# Patient Record
Sex: Male | Born: 1940 | Race: White | Hispanic: No | Smoking: Former smoker
Health system: Southern US, Community
[De-identification: ages and names within clinical notes are randomized; demographics above are authoritative.]

## PROBLEM LIST (undated history)

## (undated) DIAGNOSIS — J189 Pneumonia, unspecified organism: Secondary | ICD-10-CM

## (undated) DIAGNOSIS — M1990 Unspecified osteoarthritis, unspecified site: Secondary | ICD-10-CM

## (undated) DIAGNOSIS — Z8739 Personal history of other diseases of the musculoskeletal system and connective tissue: Secondary | ICD-10-CM

## (undated) DIAGNOSIS — E785 Hyperlipidemia, unspecified: Secondary | ICD-10-CM

## (undated) DIAGNOSIS — R0600 Dyspnea, unspecified: Secondary | ICD-10-CM

## (undated) DIAGNOSIS — C801 Malignant (primary) neoplasm, unspecified: Secondary | ICD-10-CM

## (undated) DIAGNOSIS — J439 Emphysema, unspecified: Secondary | ICD-10-CM

## (undated) DIAGNOSIS — I10 Essential (primary) hypertension: Secondary | ICD-10-CM

## (undated) HISTORY — PX: CATARACT EXTRACTION W/ INTRAOCULAR LENS IMPLANT: SHX1309

## (undated) HISTORY — DX: Malignant (primary) neoplasm, unspecified: C80.1

## (undated) HISTORY — PX: BACK SURGERY: SHX140

## (undated) HISTORY — PX: LUMBAR DISC SURGERY: SHX700

## (undated) HISTORY — PX: TONSILLECTOMY: SUR1361

## (undated) SURGERY — VIDEO BRONCHOSCOPY WITHOUT FLUORO
Anesthesia: Moderate Sedation

---

## 1997-05-10 ENCOUNTER — Ambulatory Visit (HOSPITAL_COMMUNITY): Admission: RE | Admit: 1997-05-10 | Discharge: 1997-05-10 | Payer: Self-pay | Admitting: Internal Medicine

## 1997-08-25 ENCOUNTER — Ambulatory Visit (HOSPITAL_COMMUNITY): Admission: RE | Admit: 1997-08-25 | Discharge: 1997-08-25 | Payer: Self-pay | Admitting: Internal Medicine

## 1997-08-25 ENCOUNTER — Encounter: Payer: Self-pay | Admitting: Internal Medicine

## 1998-09-24 ENCOUNTER — Emergency Department (HOSPITAL_COMMUNITY): Admission: EM | Admit: 1998-09-24 | Discharge: 1998-09-24 | Payer: Self-pay | Admitting: Emergency Medicine

## 1998-10-25 ENCOUNTER — Observation Stay (HOSPITAL_COMMUNITY): Admission: RE | Admit: 1998-10-25 | Discharge: 1998-10-26 | Payer: Self-pay | Admitting: Neurosurgery

## 1999-01-20 ENCOUNTER — Ambulatory Visit (HOSPITAL_COMMUNITY): Admission: RE | Admit: 1999-01-20 | Discharge: 1999-01-20 | Payer: Self-pay | Admitting: Neurosurgery

## 2005-04-22 ENCOUNTER — Encounter: Admission: RE | Admit: 2005-04-22 | Discharge: 2005-04-22 | Payer: Self-pay | Admitting: Occupational Medicine

## 2005-11-21 ENCOUNTER — Encounter: Admission: RE | Admit: 2005-11-21 | Discharge: 2005-11-21 | Payer: Self-pay | Admitting: Internal Medicine

## 2005-11-24 ENCOUNTER — Encounter: Admission: RE | Admit: 2005-11-24 | Discharge: 2005-11-24 | Payer: Self-pay | Admitting: Internal Medicine

## 2008-07-31 ENCOUNTER — Encounter: Admission: RE | Admit: 2008-07-31 | Discharge: 2008-07-31 | Payer: Self-pay | Admitting: Internal Medicine

## 2008-12-25 ENCOUNTER — Encounter: Admission: RE | Admit: 2008-12-25 | Discharge: 2008-12-25 | Payer: Self-pay | Admitting: Internal Medicine

## 2014-06-02 ENCOUNTER — Other Ambulatory Visit: Payer: Self-pay | Admitting: Internal Medicine

## 2014-06-02 DIAGNOSIS — F17200 Nicotine dependence, unspecified, uncomplicated: Secondary | ICD-10-CM

## 2014-09-04 ENCOUNTER — Ambulatory Visit
Admission: RE | Admit: 2014-09-04 | Discharge: 2014-09-04 | Disposition: A | Discharge status: Home / Self Care | Payer: Medicare HMO | Source: Ambulatory Visit | Attending: Internal Medicine | Admitting: Internal Medicine

## 2014-09-04 ENCOUNTER — Other Ambulatory Visit: Payer: Self-pay | Admitting: Internal Medicine

## 2014-09-04 DIAGNOSIS — J449 Chronic obstructive pulmonary disease, unspecified: Secondary | ICD-10-CM

## 2015-08-09 ENCOUNTER — Other Ambulatory Visit: Payer: Self-pay | Admitting: Internal Medicine

## 2015-08-09 DIAGNOSIS — F17200 Nicotine dependence, unspecified, uncomplicated: Secondary | ICD-10-CM

## 2015-08-23 ENCOUNTER — Encounter (INDEPENDENT_AMBULATORY_CARE_PROVIDER_SITE_OTHER): Payer: Medicare HMO | Admitting: Ophthalmology

## 2015-08-23 DIAGNOSIS — I10 Essential (primary) hypertension: Secondary | ICD-10-CM | POA: Diagnosis not present

## 2015-08-23 DIAGNOSIS — H353221 Exudative age-related macular degeneration, left eye, with active choroidal neovascularization: Secondary | ICD-10-CM | POA: Diagnosis not present

## 2015-08-23 DIAGNOSIS — H353112 Nonexudative age-related macular degeneration, right eye, intermediate dry stage: Secondary | ICD-10-CM | POA: Diagnosis not present

## 2015-08-23 DIAGNOSIS — H43813 Vitreous degeneration, bilateral: Secondary | ICD-10-CM

## 2015-08-23 DIAGNOSIS — D3131 Benign neoplasm of right choroid: Secondary | ICD-10-CM

## 2015-08-23 DIAGNOSIS — D3132 Benign neoplasm of left choroid: Secondary | ICD-10-CM

## 2015-08-23 DIAGNOSIS — H35033 Hypertensive retinopathy, bilateral: Secondary | ICD-10-CM | POA: Diagnosis not present

## 2015-09-20 ENCOUNTER — Encounter (INDEPENDENT_AMBULATORY_CARE_PROVIDER_SITE_OTHER): Payer: Medicare HMO | Admitting: Ophthalmology

## 2015-09-20 DIAGNOSIS — I10 Essential (primary) hypertension: Secondary | ICD-10-CM

## 2015-09-20 DIAGNOSIS — H353112 Nonexudative age-related macular degeneration, right eye, intermediate dry stage: Secondary | ICD-10-CM | POA: Diagnosis not present

## 2015-09-20 DIAGNOSIS — H353221 Exudative age-related macular degeneration, left eye, with active choroidal neovascularization: Secondary | ICD-10-CM | POA: Diagnosis not present

## 2015-09-20 DIAGNOSIS — H35033 Hypertensive retinopathy, bilateral: Secondary | ICD-10-CM | POA: Diagnosis not present

## 2015-09-20 DIAGNOSIS — D3131 Benign neoplasm of right choroid: Secondary | ICD-10-CM

## 2015-09-20 DIAGNOSIS — H43813 Vitreous degeneration, bilateral: Secondary | ICD-10-CM | POA: Diagnosis not present

## 2015-10-18 ENCOUNTER — Encounter (INDEPENDENT_AMBULATORY_CARE_PROVIDER_SITE_OTHER): Payer: Medicare HMO | Admitting: Ophthalmology

## 2015-12-14 ENCOUNTER — Encounter (INDEPENDENT_AMBULATORY_CARE_PROVIDER_SITE_OTHER): Payer: Medicare HMO | Admitting: Ophthalmology

## 2016-07-04 ENCOUNTER — Ambulatory Visit
Admission: RE | Admit: 2016-07-04 | Discharge: 2016-07-04 | Disposition: A | Discharge status: Home / Self Care | Payer: Medicare HMO | Source: Ambulatory Visit | Attending: Internal Medicine | Admitting: Internal Medicine

## 2016-07-04 ENCOUNTER — Other Ambulatory Visit: Payer: Self-pay | Admitting: Internal Medicine

## 2016-07-04 DIAGNOSIS — R634 Abnormal weight loss: Secondary | ICD-10-CM

## 2016-07-04 DIAGNOSIS — R059 Cough, unspecified: Secondary | ICD-10-CM

## 2016-07-04 DIAGNOSIS — R05 Cough: Secondary | ICD-10-CM

## 2016-08-21 ENCOUNTER — Emergency Department (HOSPITAL_COMMUNITY): Payer: Medicare HMO

## 2016-08-21 ENCOUNTER — Emergency Department (HOSPITAL_COMMUNITY): Admit: 2016-08-21 | Payer: Medicare HMO

## 2016-08-21 ENCOUNTER — Ambulatory Visit (INDEPENDENT_AMBULATORY_CARE_PROVIDER_SITE_OTHER): Payer: Medicare HMO | Admitting: Podiatry

## 2016-08-21 ENCOUNTER — Encounter (HOSPITAL_COMMUNITY): Payer: Self-pay | Admitting: Emergency Medicine

## 2016-08-21 ENCOUNTER — Emergency Department (HOSPITAL_COMMUNITY): Payer: Medicare HMO | Admitting: Certified Registered Nurse Anesthetist

## 2016-08-21 ENCOUNTER — Encounter: Payer: Self-pay | Admitting: Podiatry

## 2016-08-21 ENCOUNTER — Other Ambulatory Visit: Payer: Self-pay

## 2016-08-21 ENCOUNTER — Ambulatory Visit (INDEPENDENT_AMBULATORY_CARE_PROVIDER_SITE_OTHER): Payer: Medicare HMO

## 2016-08-21 ENCOUNTER — Inpatient Hospital Stay (HOSPITAL_COMMUNITY)
Admission: EM | Admit: 2016-08-21 | Discharge: 2016-09-16 | DRG: 239 | Disposition: A | Discharge status: Skilled Nursing Facility | Payer: Medicare HMO | Attending: Internal Medicine | Admitting: Internal Medicine

## 2016-08-21 ENCOUNTER — Encounter (HOSPITAL_COMMUNITY)
Admission: EM | Disposition: A | Discharge status: Skilled Nursing Facility | Payer: Self-pay | Source: Home / Self Care | Attending: Vascular Surgery

## 2016-08-21 ENCOUNTER — Ambulatory Visit: Payer: Medicare HMO

## 2016-08-21 VITALS — BP 111/82 | HR 105

## 2016-08-21 DIAGNOSIS — E1151 Type 2 diabetes mellitus with diabetic peripheral angiopathy without gangrene: Principal | ICD-10-CM | POA: Diagnosis present

## 2016-08-21 DIAGNOSIS — R001 Bradycardia, unspecified: Secondary | ICD-10-CM | POA: Diagnosis not present

## 2016-08-21 DIAGNOSIS — I999 Unspecified disorder of circulatory system: Secondary | ICD-10-CM

## 2016-08-21 DIAGNOSIS — E871 Hypo-osmolality and hyponatremia: Secondary | ICD-10-CM | POA: Diagnosis not present

## 2016-08-21 DIAGNOSIS — M7989 Other specified soft tissue disorders: Secondary | ICD-10-CM | POA: Diagnosis present

## 2016-08-21 DIAGNOSIS — J441 Chronic obstructive pulmonary disease with (acute) exacerbation: Secondary | ICD-10-CM | POA: Diagnosis present

## 2016-08-21 DIAGNOSIS — R0603 Acute respiratory distress: Secondary | ICD-10-CM | POA: Diagnosis not present

## 2016-08-21 DIAGNOSIS — E876 Hypokalemia: Secondary | ICD-10-CM | POA: Diagnosis present

## 2016-08-21 DIAGNOSIS — R590 Localized enlarged lymph nodes: Secondary | ICD-10-CM | POA: Diagnosis present

## 2016-08-21 DIAGNOSIS — J910 Malignant pleural effusion: Secondary | ICD-10-CM | POA: Diagnosis present

## 2016-08-21 DIAGNOSIS — E785 Hyperlipidemia, unspecified: Secondary | ICD-10-CM | POA: Diagnosis present

## 2016-08-21 DIAGNOSIS — L8990 Pressure ulcer of unspecified site, unspecified stage: Secondary | ICD-10-CM | POA: Insufficient documentation

## 2016-08-21 DIAGNOSIS — I110 Hypertensive heart disease with heart failure: Secondary | ICD-10-CM | POA: Diagnosis present

## 2016-08-21 DIAGNOSIS — I10 Essential (primary) hypertension: Secondary | ICD-10-CM | POA: Diagnosis not present

## 2016-08-21 DIAGNOSIS — C3401 Malignant neoplasm of right main bronchus: Secondary | ICD-10-CM | POA: Diagnosis present

## 2016-08-21 DIAGNOSIS — T45515A Adverse effect of anticoagulants, initial encounter: Secondary | ICD-10-CM | POA: Diagnosis not present

## 2016-08-21 DIAGNOSIS — D630 Anemia in neoplastic disease: Secondary | ICD-10-CM | POA: Diagnosis present

## 2016-08-21 DIAGNOSIS — I471 Supraventricular tachycardia: Secondary | ICD-10-CM | POA: Diagnosis not present

## 2016-08-21 DIAGNOSIS — Z961 Presence of intraocular lens: Secondary | ICD-10-CM | POA: Diagnosis present

## 2016-08-21 DIAGNOSIS — R918 Other nonspecific abnormal finding of lung field: Secondary | ICD-10-CM | POA: Diagnosis present

## 2016-08-21 DIAGNOSIS — Z7952 Long term (current) use of systemic steroids: Secondary | ICD-10-CM

## 2016-08-21 DIAGNOSIS — R042 Hemoptysis: Secondary | ICD-10-CM | POA: Diagnosis not present

## 2016-08-21 DIAGNOSIS — C3491 Malignant neoplasm of unspecified part of right bronchus or lung: Secondary | ICD-10-CM

## 2016-08-21 DIAGNOSIS — M109 Gout, unspecified: Secondary | ICD-10-CM | POA: Diagnosis present

## 2016-08-21 DIAGNOSIS — D3500 Benign neoplasm of unspecified adrenal gland: Secondary | ICD-10-CM | POA: Diagnosis present

## 2016-08-21 DIAGNOSIS — L89313 Pressure ulcer of right buttock, stage 3: Secondary | ICD-10-CM | POA: Diagnosis present

## 2016-08-21 DIAGNOSIS — Z510 Encounter for antineoplastic radiation therapy: Secondary | ICD-10-CM | POA: Diagnosis present

## 2016-08-21 DIAGNOSIS — M79671 Pain in right foot: Secondary | ICD-10-CM

## 2016-08-21 DIAGNOSIS — M722 Plantar fascial fibromatosis: Secondary | ICD-10-CM | POA: Diagnosis not present

## 2016-08-21 DIAGNOSIS — Z89511 Acquired absence of right leg below knee: Secondary | ICD-10-CM | POA: Diagnosis not present

## 2016-08-21 DIAGNOSIS — J189 Pneumonia, unspecified organism: Secondary | ICD-10-CM | POA: Diagnosis not present

## 2016-08-21 DIAGNOSIS — I5031 Acute diastolic (congestive) heart failure: Secondary | ICD-10-CM | POA: Diagnosis present

## 2016-08-21 DIAGNOSIS — J440 Chronic obstructive pulmonary disease with acute lower respiratory infection: Secondary | ICD-10-CM | POA: Diagnosis not present

## 2016-08-21 DIAGNOSIS — C3431 Malignant neoplasm of lower lobe, right bronchus or lung: Secondary | ICD-10-CM | POA: Diagnosis present

## 2016-08-21 DIAGNOSIS — I4891 Unspecified atrial fibrillation: Secondary | ICD-10-CM | POA: Diagnosis not present

## 2016-08-21 DIAGNOSIS — D696 Thrombocytopenia, unspecified: Secondary | ICD-10-CM | POA: Diagnosis present

## 2016-08-21 DIAGNOSIS — I700 Atherosclerosis of aorta: Secondary | ICD-10-CM | POA: Diagnosis present

## 2016-08-21 DIAGNOSIS — R0600 Dyspnea, unspecified: Secondary | ICD-10-CM

## 2016-08-21 DIAGNOSIS — I998 Other disorder of circulatory system: Secondary | ICD-10-CM | POA: Diagnosis present

## 2016-08-21 DIAGNOSIS — Z881 Allergy status to other antibiotic agents status: Secondary | ICD-10-CM

## 2016-08-21 DIAGNOSIS — J449 Chronic obstructive pulmonary disease, unspecified: Secondary | ICD-10-CM | POA: Diagnosis present

## 2016-08-21 DIAGNOSIS — Z79899 Other long term (current) drug therapy: Secondary | ICD-10-CM

## 2016-08-21 DIAGNOSIS — Z87891 Personal history of nicotine dependence: Secondary | ICD-10-CM

## 2016-08-21 DIAGNOSIS — Z9889 Other specified postprocedural states: Secondary | ICD-10-CM

## 2016-08-21 DIAGNOSIS — I959 Hypotension, unspecified: Secondary | ICD-10-CM | POA: Diagnosis not present

## 2016-08-21 DIAGNOSIS — R0902 Hypoxemia: Secondary | ICD-10-CM | POA: Diagnosis not present

## 2016-08-21 DIAGNOSIS — C7931 Secondary malignant neoplasm of brain: Secondary | ICD-10-CM | POA: Diagnosis present

## 2016-08-21 DIAGNOSIS — I480 Paroxysmal atrial fibrillation: Secondary | ICD-10-CM | POA: Diagnosis not present

## 2016-08-21 DIAGNOSIS — J9589 Other postprocedural complications and disorders of respiratory system, not elsewhere classified: Secondary | ICD-10-CM | POA: Diagnosis not present

## 2016-08-21 DIAGNOSIS — R911 Solitary pulmonary nodule: Secondary | ICD-10-CM | POA: Diagnosis not present

## 2016-08-21 HISTORY — PX: LOWER EXTREMITY ANGIOGRAM: SHX5955

## 2016-08-21 HISTORY — PX: EMBOLECTOMY: SHX44

## 2016-08-21 HISTORY — DX: Essential (primary) hypertension: I10

## 2016-08-21 HISTORY — PX: LOWER EXTREMITY ANGIOGRAM: SHX5508

## 2016-08-21 HISTORY — DX: Unspecified osteoarthritis, unspecified site: M19.90

## 2016-08-21 HISTORY — PX: THROMBECTOMY: PRO61

## 2016-08-21 HISTORY — DX: Emphysema, unspecified: J43.9

## 2016-08-21 HISTORY — DX: Personal history of other diseases of the musculoskeletal system and connective tissue: Z87.39

## 2016-08-21 HISTORY — DX: Hyperlipidemia, unspecified: E78.5

## 2016-08-21 HISTORY — DX: Dyspnea, unspecified: R06.00

## 2016-08-21 HISTORY — DX: Pneumonia, unspecified organism: J18.9

## 2016-08-21 LAB — PROTIME-INR
INR: 1.14
PROTHROMBIN TIME: 14.7 s (ref 11.4–15.2)

## 2016-08-21 LAB — COMPREHENSIVE METABOLIC PANEL
ALT: 31 U/L (ref 17–63)
ANION GAP: 9 (ref 5–15)
AST: 22 U/L (ref 15–41)
Albumin: 2.2 g/dL — ABNORMAL LOW (ref 3.5–5.0)
Alkaline Phosphatase: 85 U/L (ref 38–126)
BUN: 12 mg/dL (ref 6–20)
CHLORIDE: 101 mmol/L (ref 101–111)
CO2: 24 mmol/L (ref 22–32)
CREATININE: 0.78 mg/dL (ref 0.61–1.24)
Calcium: 8.2 mg/dL — ABNORMAL LOW (ref 8.9–10.3)
Glucose, Bld: 113 mg/dL — ABNORMAL HIGH (ref 65–99)
Potassium: 4.3 mmol/L (ref 3.5–5.1)
SODIUM: 134 mmol/L — AB (ref 135–145)
Total Bilirubin: 1.2 mg/dL (ref 0.3–1.2)
Total Protein: 5.5 g/dL — ABNORMAL LOW (ref 6.5–8.1)

## 2016-08-21 LAB — CBC WITH DIFFERENTIAL/PLATELET
BASOS ABS: 0 10*3/uL (ref 0.0–0.1)
BASOS PCT: 0 %
EOS ABS: 0 10*3/uL (ref 0.0–0.7)
Eosinophils Relative: 0 %
HCT: 40 % (ref 39.0–52.0)
Hemoglobin: 13.7 g/dL (ref 13.0–17.0)
LYMPHS ABS: 3.3 10*3/uL (ref 0.7–4.0)
Lymphocytes Relative: 12 %
MCH: 28.8 pg (ref 26.0–34.0)
MCHC: 34.3 g/dL (ref 30.0–36.0)
MCV: 84.2 fL (ref 78.0–100.0)
MONO ABS: 2.2 10*3/uL — AB (ref 0.1–1.0)
Monocytes Relative: 8 %
NEUTROS ABS: 22.1 10*3/uL — AB (ref 1.7–7.7)
Neutrophils Relative %: 80 %
PLATELETS: 176 10*3/uL (ref 150–400)
RBC: 4.75 MIL/uL (ref 4.22–5.81)
RDW: 15.9 % — AB (ref 11.5–15.5)
WBC: 27.6 10*3/uL — ABNORMAL HIGH (ref 4.0–10.5)

## 2016-08-21 LAB — TROPONIN I: TROPONIN I: 0.03 ng/mL — AB (ref <0.03)

## 2016-08-21 LAB — BRAIN NATRIURETIC PEPTIDE: B NATRIURETIC PEPTIDE 5: 53.8 pg/mL (ref 0.0–100.0)

## 2016-08-21 SURGERY — EMBOLECTOMY
Anesthesia: General | Site: Leg Lower | Laterality: Right

## 2016-08-21 MED ORDER — NON FORMULARY
Status: DC | PRN
  Administered 2016-08-21: 5 mg via INTRA_ARTERIAL

## 2016-08-21 MED ORDER — ONDANSETRON HCL 4 MG/2ML IJ SOLN
4.0000 mg | Freq: Four times a day (QID) | INTRAMUSCULAR | Status: DC | PRN
  Administered 2016-08-24: 4 mg via INTRAVENOUS
  Filled 2016-08-21: qty 2

## 2016-08-21 MED ORDER — HYDROCORTISONE NA SUCCINATE PF 100 MG IJ SOLR
100.0000 mg | Freq: Once | INTRAMUSCULAR | Status: AC
  Administered 2016-08-21: 100 mg via INTRAVENOUS
  Filled 2016-08-21: qty 2

## 2016-08-21 MED ORDER — DOXYCYCLINE HYCLATE 100 MG PO TABS
100.0000 mg | ORAL_TABLET | Freq: Two times a day (BID) | ORAL | Status: DC
  Administered 2016-08-21 – 2016-08-23 (×4): 100 mg via ORAL
  Filled 2016-08-21 (×4): qty 1

## 2016-08-21 MED ORDER — ALTEPLASE 2 MG IJ SOLR
10.0000 mg | INTRAMUSCULAR | Status: DC
  Filled 2016-08-21: qty 10

## 2016-08-21 MED ORDER — BISACODYL 5 MG PO TBEC
5.0000 mg | DELAYED_RELEASE_TABLET | Freq: Every day | ORAL | Status: DC | PRN
  Administered 2016-08-28 – 2016-09-03 (×5): 5 mg via ORAL
  Filled 2016-08-21 (×5): qty 1

## 2016-08-21 MED ORDER — METHYLPREDNISOLONE SODIUM SUCC 125 MG IJ SOLR
60.0000 mg | Freq: Three times a day (TID) | INTRAMUSCULAR | Status: DC
  Administered 2016-08-22: 60 mg via INTRAVENOUS
  Filled 2016-08-21: qty 2

## 2016-08-21 MED ORDER — IODIXANOL 320 MG/ML IV SOLN
INTRAVENOUS | Status: DC | PRN
  Administered 2016-08-21: 30 mL via INTRAVENOUS

## 2016-08-21 MED ORDER — PROPOFOL 10 MG/ML IV BOLUS
INTRAVENOUS | Status: DC | PRN
  Administered 2016-08-21: 110 mg via INTRAVENOUS

## 2016-08-21 MED ORDER — AMIODARONE HCL IN DEXTROSE 360-4.14 MG/200ML-% IV SOLN
30.0000 mg/h | INTRAVENOUS | Status: DC
  Administered 2016-08-22 (×2): 30 mg/h via INTRAVENOUS
  Filled 2016-08-21 (×3): qty 200

## 2016-08-21 MED ORDER — NYSTATIN 100000 UNIT/ML MT SUSP: 5.0000 mL | Freq: Four times a day (QID) | OROMUCOSAL | Status: DC

## 2016-08-21 MED ORDER — LIDOCAINE 2% (20 MG/ML) 5 ML SYRINGE
INTRAMUSCULAR | Status: DC | PRN
  Administered 2016-08-21: 60 mg via INTRAVENOUS

## 2016-08-21 MED ORDER — DIPHENHYDRAMINE HCL 50 MG/ML IJ SOLN: 12.5000 mg | Freq: Four times a day (QID) | INTRAMUSCULAR | Status: DC | PRN

## 2016-08-21 MED ORDER — SODIUM CHLORIDE 0.9 % IV SOLN
INTRAVENOUS | Status: DC
  Administered 2016-08-21: 21:00:00 via INTRAVENOUS

## 2016-08-21 MED ORDER — SODIUM CHLORIDE 0.9 % IV SOLN
INTRAVENOUS | Status: DC | PRN
  Administered 2016-08-21: 500 mL

## 2016-08-21 MED ORDER — ACETAMINOPHEN 650 MG RE SUPP: 325.0000 mg | RECTAL | Status: DC | PRN

## 2016-08-21 MED ORDER — ONDANSETRON HCL 4 MG/2ML IJ SOLN
4.0000 mg | Freq: Four times a day (QID) | INTRAMUSCULAR | Status: DC | PRN
Start: 2016-08-21 — End: 2016-08-22

## 2016-08-21 MED ORDER — DM-GUAIFENESIN ER 30-600 MG PO TB12
1.0000 | ORAL_TABLET | Freq: Two times a day (BID) | ORAL | Status: DC
  Administered 2016-08-21 – 2016-09-16 (×49): 1 via ORAL
  Filled 2016-08-21 (×50): qty 1

## 2016-08-21 MED ORDER — DEXAMETHASONE SODIUM PHOSPHATE 10 MG/ML IJ SOLN
INTRAMUSCULAR | Status: DC | PRN
  Administered 2016-08-21: 5 mg via INTRAVENOUS

## 2016-08-21 MED ORDER — LEVALBUTEROL HCL 1.25 MG/0.5ML IN NEBU
1.2500 mg | INHALATION_SOLUTION | Freq: Three times a day (TID) | RESPIRATORY_TRACT | Status: DC
  Administered 2016-08-22 (×3): 1.25 mg via RESPIRATORY_TRACT
  Filled 2016-08-21 (×7): qty 0.5

## 2016-08-21 MED ORDER — ACETAMINOPHEN 325 MG PO TABS
325.0000 mg | ORAL_TABLET | ORAL | Status: DC | PRN
  Administered 2016-08-23 (×2): 650 mg via ORAL
  Filled 2016-08-21 (×2): qty 2

## 2016-08-21 MED ORDER — LACTATED RINGERS IV SOLN
INTRAVENOUS | Status: DC
  Administered 2016-08-21 – 2016-08-28 (×3): via INTRAVENOUS

## 2016-08-21 MED ORDER — ESMOLOL HCL 100 MG/10ML IV SOLN
INTRAVENOUS | Status: DC | PRN
  Administered 2016-08-21: 20 mg via INTRAVENOUS
  Administered 2016-08-21: 30 mg via INTRAVENOUS

## 2016-08-21 MED ORDER — PANTOPRAZOLE SODIUM 40 MG PO TBEC
40.0000 mg | DELAYED_RELEASE_TABLET | Freq: Every day | ORAL | Status: DC
  Administered 2016-08-22 – 2016-09-16 (×24): 40 mg via ORAL
  Filled 2016-08-21 (×25): qty 1

## 2016-08-21 MED ORDER — METOPROLOL TARTRATE 5 MG/5ML IV SOLN
INTRAVENOUS | Status: AC
  Filled 2016-08-21: qty 5

## 2016-08-21 MED ORDER — MORPHINE SULFATE 2 MG/ML IV SOLN
INTRAVENOUS | Status: DC
  Administered 2016-08-21: 0 mg via INTRAVENOUS
  Administered 2016-08-21: 21:00:00 via INTRAVENOUS
  Administered 2016-08-22: 0 mg via INTRAVENOUS
  Administered 2016-08-22: 1 mg via INTRAVENOUS
  Administered 2016-08-22: 0 mg via INTRAVENOUS
  Administered 2016-08-22: 1 mg via INTRAVENOUS
  Filled 2016-08-21: qty 25
  Filled 2016-08-21: qty 30

## 2016-08-21 MED ORDER — NALOXONE HCL 0.4 MG/ML IJ SOLN: 0.4000 mg | INTRAMUSCULAR | Status: DC | PRN

## 2016-08-21 MED ORDER — IPRATROPIUM BROMIDE 0.02 % IN SOLN
0.5000 mg | Freq: Three times a day (TID) | RESPIRATORY_TRACT | Status: DC
  Administered 2016-08-22 (×3): 0.5 mg via RESPIRATORY_TRACT
  Filled 2016-08-21 (×6): qty 2.5

## 2016-08-21 MED ORDER — SODIUM CHLORIDE 0.9 % IV SOLN: 500.0000 mL | Freq: Once | INTRAVENOUS | Status: DC | PRN

## 2016-08-21 MED ORDER — PHENYLEPHRINE 40 MCG/ML (10ML) SYRINGE FOR IV PUSH (FOR BLOOD PRESSURE SUPPORT)
PREFILLED_SYRINGE | INTRAVENOUS | Status: AC
  Filled 2016-08-21: qty 10

## 2016-08-21 MED ORDER — PHENYLEPHRINE 40 MCG/ML (10ML) SYRINGE FOR IV PUSH (FOR BLOOD PRESSURE SUPPORT)
PREFILLED_SYRINGE | INTRAVENOUS | Status: DC | PRN
  Administered 2016-08-21 (×2): 80 ug via INTRAVENOUS

## 2016-08-21 MED ORDER — EPHEDRINE 5 MG/ML INJ
INTRAVENOUS | Status: AC
  Filled 2016-08-21: qty 10

## 2016-08-21 MED ORDER — OXYCODONE-ACETAMINOPHEN 5-325 MG PO TABS
1.0000 | ORAL_TABLET | ORAL | Status: DC | PRN
Start: 2016-08-21 — End: 2016-09-16
  Administered 2016-08-23 – 2016-09-15 (×24): 2 via ORAL
  Filled 2016-08-21 (×27): qty 2

## 2016-08-21 MED ORDER — IPRATROPIUM BROMIDE 0.02 % IN SOLN
0.5000 mg | RESPIRATORY_TRACT | Status: DC
  Filled 2016-08-21: qty 2.5

## 2016-08-21 MED ORDER — ROCURONIUM BROMIDE 10 MG/ML (PF) SYRINGE
PREFILLED_SYRINGE | INTRAVENOUS | Status: AC
  Filled 2016-08-21: qty 5

## 2016-08-21 MED ORDER — SUGAMMADEX SODIUM 200 MG/2ML IV SOLN
INTRAVENOUS | Status: AC
  Filled 2016-08-21: qty 2

## 2016-08-21 MED ORDER — 0.9 % SODIUM CHLORIDE (POUR BTL) OPTIME
TOPICAL | Status: DC | PRN
  Administered 2016-08-21: 3000 mL

## 2016-08-21 MED ORDER — DEXAMETHASONE SODIUM PHOSPHATE 10 MG/ML IJ SOLN
INTRAMUSCULAR | Status: AC
  Filled 2016-08-21: qty 1

## 2016-08-21 MED ORDER — HYDROCODONE-ACETAMINOPHEN 7.5-325 MG PO TABS
1.0000 | ORAL_TABLET | Freq: Once | ORAL | Status: AC
  Administered 2016-08-21: 1 via ORAL
  Filled 2016-08-21 (×2): qty 1

## 2016-08-21 MED ORDER — DIPHENHYDRAMINE HCL 12.5 MG/5ML PO ELIX: 12.5000 mg | ORAL_SOLUTION | Freq: Four times a day (QID) | ORAL | Status: DC | PRN

## 2016-08-21 MED ORDER — SODIUM CHLORIDE 0.9% FLUSH: 9.0000 mL | INTRAVENOUS | Status: DC | PRN

## 2016-08-21 MED ORDER — SODIUM CHLORIDE 0.9 % IV BOLUS (SEPSIS)
1000.0000 mL | Freq: Once | INTRAVENOUS | Status: AC
  Administered 2016-08-21: 1000 mL via INTRAVENOUS

## 2016-08-21 MED ORDER — LEVALBUTEROL HCL 0.63 MG/3ML IN NEBU: 0.6300 mg | INHALATION_SOLUTION | Freq: Four times a day (QID) | RESPIRATORY_TRACT | Status: DC | PRN

## 2016-08-21 MED ORDER — ESMOLOL HCL 100 MG/10ML IV SOLN
INTRAVENOUS | Status: AC
  Filled 2016-08-21: qty 10

## 2016-08-21 MED ORDER — FENTANYL CITRATE (PF) 250 MCG/5ML IJ SOLN
INTRAMUSCULAR | Status: AC
  Filled 2016-08-21: qty 5

## 2016-08-21 MED ORDER — PROPOFOL 10 MG/ML IV BOLUS
INTRAVENOUS | Status: AC
  Filled 2016-08-21: qty 20

## 2016-08-21 MED ORDER — SENNOSIDES-DOCUSATE SODIUM 8.6-50 MG PO TABS: 1.0000 | ORAL_TABLET | Freq: Every evening | ORAL | Status: DC | PRN

## 2016-08-21 MED ORDER — ALUM & MAG HYDROXIDE-SIMETH 200-200-20 MG/5ML PO SUSP: 15.0000 mL | ORAL | Status: DC | PRN

## 2016-08-21 MED ORDER — LABETALOL HCL 5 MG/ML IV SOLN: 10.0000 mg | INTRAVENOUS | Status: DC | PRN

## 2016-08-21 MED ORDER — EPHEDRINE SULFATE-NACL 50-0.9 MG/10ML-% IV SOSY
PREFILLED_SYRINGE | INTRAVENOUS | Status: DC | PRN
  Administered 2016-08-21: 10 mg via INTRAVENOUS

## 2016-08-21 MED ORDER — METOPROLOL TARTRATE 5 MG/5ML IV SOLN
2.0000 mg | INTRAVENOUS | Status: AC | PRN
  Administered 2016-08-29 – 2016-08-30 (×2): 5 mg via INTRAVENOUS
  Filled 2016-08-21 (×2): qty 5

## 2016-08-21 MED ORDER — PHENYLEPHRINE HCL 10 MG/ML IJ SOLN
INTRAVENOUS | Status: DC | PRN
  Administered 2016-08-21: 25 ug/min via INTRAVENOUS

## 2016-08-21 MED ORDER — DOCUSATE SODIUM 100 MG PO CAPS
100.0000 mg | ORAL_CAPSULE | Freq: Every day | ORAL | Status: DC
  Administered 2016-08-22 – 2016-09-15 (×19): 100 mg via ORAL
  Filled 2016-08-21 (×24): qty 1

## 2016-08-21 MED ORDER — LISINOPRIL 10 MG PO TABS: 10.0000 mg | ORAL_TABLET | Freq: Every day | ORAL | Status: DC

## 2016-08-21 MED ORDER — DEXTROSE 5 % IV SOLN
1.5000 g | Freq: Two times a day (BID) | INTRAVENOUS | Status: AC
  Administered 2016-08-21 – 2016-08-22 (×2): 1.5 g via INTRAVENOUS
  Filled 2016-08-21 (×2): qty 1.5

## 2016-08-21 MED ORDER — SODIUM CHLORIDE 0.9 % IJ SOLN
INTRAMUSCULAR | Status: AC
  Filled 2016-08-21: qty 10

## 2016-08-21 MED ORDER — LEVALBUTEROL HCL 1.25 MG/0.5ML IN NEBU
1.2500 mg | INHALATION_SOLUTION | Freq: Four times a day (QID) | RESPIRATORY_TRACT | Status: DC
  Filled 2016-08-21: qty 0.5

## 2016-08-21 MED ORDER — GUAIFENESIN-DM 100-10 MG/5ML PO SYRP
15.0000 mL | ORAL_SOLUTION | ORAL | Status: DC | PRN
  Administered 2016-08-29 – 2016-09-16 (×3): 15 mL via ORAL
  Filled 2016-08-21 (×3): qty 15
  Filled 2016-08-21: qty 20

## 2016-08-21 MED ORDER — DILTIAZEM HCL 100 MG IV SOLR
5.0000 mg/h | INTRAVENOUS | Status: DC
  Filled 2016-08-21: qty 100

## 2016-08-21 MED ORDER — HEPARIN (PORCINE) IN NACL 100-0.45 UNIT/ML-% IJ SOLN
1000.0000 [IU]/h | INTRAMUSCULAR | Status: DC
Start: 2016-08-21 — End: 2016-08-21
  Filled 2016-08-21 (×2): qty 250

## 2016-08-21 MED ORDER — AMIODARONE HCL IN DEXTROSE 360-4.14 MG/200ML-% IV SOLN
30.0000 mg/h | INTRAVENOUS | Status: AC
  Administered 2016-08-21 – 2016-08-22 (×2): 60 mg/h via INTRAVENOUS
  Filled 2016-08-21 (×2): qty 200

## 2016-08-21 MED ORDER — CEFAZOLIN SODIUM-DEXTROSE 2-3 GM-% IV SOLR
INTRAVENOUS | Status: DC | PRN
  Administered 2016-08-21: 2 g via INTRAVENOUS

## 2016-08-21 MED ORDER — SUGAMMADEX SODIUM 200 MG/2ML IV SOLN
INTRAVENOUS | Status: DC | PRN
  Administered 2016-08-21: 200 mg via INTRAVENOUS

## 2016-08-21 MED ORDER — METOPROLOL TARTRATE 5 MG/5ML IV SOLN
5.0000 mg | Freq: Once | INTRAVENOUS | Status: AC
  Administered 2016-08-21: 2.5 mg via INTRAVENOUS

## 2016-08-21 MED ORDER — AMIODARONE IV BOLUS ONLY 150 MG/100ML
150.0000 mg | Freq: Once | INTRAVENOUS | Status: DC
  Filled 2016-08-21: qty 100

## 2016-08-21 MED ORDER — SUCCINYLCHOLINE CHLORIDE 200 MG/10ML IV SOSY
PREFILLED_SYRINGE | INTRAVENOUS | Status: AC
  Filled 2016-08-21: qty 10

## 2016-08-21 MED ORDER — HYDROMORPHONE HCL 1 MG/ML IJ SOLN: 0.2500 mg | INTRAMUSCULAR | Status: DC | PRN

## 2016-08-21 MED ORDER — LACTATED RINGERS IV SOLN
INTRAVENOUS | Status: DC | PRN
  Administered 2016-08-21 (×2): via INTRAVENOUS

## 2016-08-21 MED ORDER — ONDANSETRON HCL 4 MG/2ML IJ SOLN
INTRAMUSCULAR | Status: DC | PRN
  Administered 2016-08-21: 4 mg via INTRAVENOUS

## 2016-08-21 MED ORDER — ALBUMIN HUMAN 5 % IV SOLN
INTRAVENOUS | Status: DC | PRN
  Administered 2016-08-21: 19:00:00 via INTRAVENOUS

## 2016-08-21 MED ORDER — HYDRALAZINE HCL 20 MG/ML IJ SOLN
5.0000 mg | INTRAMUSCULAR | Status: DC | PRN
  Filled 2016-08-21: qty 0.25

## 2016-08-21 MED ORDER — HEPARIN BOLUS VIA INFUSION
3000.0000 [IU] | Freq: Once | INTRAVENOUS | Status: DC
  Filled 2016-08-21: qty 3000

## 2016-08-21 MED ORDER — PREDNISONE 10 MG PO TABS: 10.0000 mg | ORAL_TABLET | Freq: Every day | ORAL | Status: DC

## 2016-08-21 MED ORDER — HEPARIN (PORCINE) IN NACL 100-0.45 UNIT/ML-% IJ SOLN
1200.0000 [IU]/h | INTRAMUSCULAR | Status: DC
  Administered 2016-08-21: 750 [IU]/h via INTRAVENOUS
  Administered 2016-08-22: 1200 [IU]/h via INTRAVENOUS
  Filled 2016-08-21 (×2): qty 250

## 2016-08-21 MED ORDER — CEFAZOLIN SODIUM 1 G IJ SOLR
INTRAMUSCULAR | Status: AC
  Filled 2016-08-21: qty 20

## 2016-08-21 MED ORDER — PHENOL 1.4 % MT LIQD
1.0000 | OROMUCOSAL | Status: DC | PRN
  Filled 2016-08-21 (×2): qty 177

## 2016-08-21 MED ORDER — LIDOCAINE 2% (20 MG/ML) 5 ML SYRINGE
INTRAMUSCULAR | Status: AC
  Filled 2016-08-21: qty 5

## 2016-08-21 MED ORDER — AMIODARONE LOAD VIA INFUSION
150.0000 mg | Freq: Once | INTRAVENOUS | Status: AC
  Administered 2016-08-21: 150 mg via INTRAVENOUS
  Filled 2016-08-21: qty 83.34

## 2016-08-21 MED ORDER — ATORVASTATIN CALCIUM 40 MG PO TABS
40.0000 mg | ORAL_TABLET | Freq: Every day | ORAL | Status: DC
  Administered 2016-08-22 – 2016-09-15 (×23): 40 mg via ORAL
  Filled 2016-08-21 (×24): qty 1

## 2016-08-21 MED ORDER — SUCCINYLCHOLINE CHLORIDE 200 MG/10ML IV SOSY
PREFILLED_SYRINGE | INTRAVENOUS | Status: DC | PRN
  Administered 2016-08-21: 100 mg via INTRAVENOUS

## 2016-08-21 MED ORDER — ROCURONIUM BROMIDE 10 MG/ML (PF) SYRINGE
PREFILLED_SYRINGE | INTRAVENOUS | Status: DC | PRN
  Administered 2016-08-21: 50 mg via INTRAVENOUS

## 2016-08-21 MED ORDER — FENTANYL CITRATE (PF) 100 MCG/2ML IJ SOLN
INTRAMUSCULAR | Status: DC | PRN
  Administered 2016-08-21 (×3): 50 ug via INTRAVENOUS

## 2016-08-21 MED ORDER — HEPARIN SODIUM (PORCINE) 1000 UNIT/ML IJ SOLN
INTRAMUSCULAR | Status: DC | PRN
  Administered 2016-08-21: 3000 [IU] via INTRAVENOUS
  Administered 2016-08-21: 7000 [IU] via INTRAVENOUS

## 2016-08-21 MED ORDER — MAGNESIUM SULFATE 2 GM/50ML IV SOLN
2.0000 g | Freq: Every day | INTRAVENOUS | Status: DC | PRN
  Filled 2016-08-21: qty 50

## 2016-08-21 MED ORDER — POTASSIUM CHLORIDE CRYS ER 20 MEQ PO TBCR: 20.0000 meq | EXTENDED_RELEASE_TABLET | Freq: Every day | ORAL | Status: DC | PRN

## 2016-08-21 MED ORDER — SODIUM CHLORIDE 0.9 % IJ SOLN: INTRAVENOUS | Status: DC | PRN

## 2016-08-21 SURGICAL SUPPLY — 90 items
BAG SNAP BAND KOVER 36X36 (MISCELLANEOUS) ×4 IMPLANT
BLADE SURG 11 STRL SS (BLADE) IMPLANT
CANISTER SUCT 3000ML PPV (MISCELLANEOUS) ×4 IMPLANT
CANNULA VESSEL 3MM 2 BLNT TIP (CANNULA) IMPLANT
CATH ANGIO 5F BER2 65CM (CATHETERS) IMPLANT
CATH EMB 2FR 60CM (CATHETERS) ×4 IMPLANT
CATH OMNI FLUSH .035X70CM (CATHETERS) IMPLANT
CHLORAPREP W/TINT 26ML (MISCELLANEOUS) IMPLANT
CLIP VESOCCLUDE MED 24/CT (CLIP) ×4 IMPLANT
CLIP VESOCCLUDE SM WIDE 24/CT (CLIP) ×4 IMPLANT
COVER DOME SNAP 22 D (MISCELLANEOUS) ×4 IMPLANT
COVER PROBE W GEL 5X96 (DRAPES) ×4 IMPLANT
COVER SURGICAL LIGHT HANDLE (MISCELLANEOUS) IMPLANT
CUFF TOURNIQUET SINGLE 24IN (TOURNIQUET CUFF) IMPLANT
CUFF TOURNIQUET SINGLE 34IN LL (TOURNIQUET CUFF) IMPLANT
CUFF TOURNIQUET SINGLE 44IN (TOURNIQUET CUFF) IMPLANT
DERMABOND ADHESIVE PROPEN (GAUZE/BANDAGES/DRESSINGS) ×2
DERMABOND ADVANCED (GAUZE/BANDAGES/DRESSINGS) ×2
DERMABOND ADVANCED .7 DNX12 (GAUZE/BANDAGES/DRESSINGS) ×2 IMPLANT
DERMABOND ADVANCED .7 DNX6 (GAUZE/BANDAGES/DRESSINGS) ×2 IMPLANT
DEVICE TORQUE KENDALL .025-038 (MISCELLANEOUS) IMPLANT
DRAIN CHANNEL 15F RND FF W/TCR (WOUND CARE) IMPLANT
DRAPE C-ARM 42X72 X-RAY (DRAPES) IMPLANT
DRAPE FEMORAL ANGIO 80X135IN (DRAPES) IMPLANT
DRAPE HALF SHEET 40X57 (DRAPES) IMPLANT
DRSG COVADERM 4X8 (GAUZE/BANDAGES/DRESSINGS) ×4 IMPLANT
DRSG TEGADERM 2-3/8X2-3/4 SM (GAUZE/BANDAGES/DRESSINGS) ×4 IMPLANT
ELECT REM PT RETURN 9FT ADLT (ELECTROSURGICAL) ×4
ELECTRODE REM PT RTRN 9FT ADLT (ELECTROSURGICAL) ×2 IMPLANT
EVACUATOR SILICONE 100CC (DRAIN) IMPLANT
GAUZE SPONGE 2X2 8PLY STRL LF (GAUZE/BANDAGES/DRESSINGS) ×2 IMPLANT
GAUZE SPONGE 4X4 12PLY STRL LF (GAUZE/BANDAGES/DRESSINGS) ×8 IMPLANT
GAUZE SPONGE 4X4 16PLY XRAY LF (GAUZE/BANDAGES/DRESSINGS) ×4 IMPLANT
GLOVE BIO SURGEON STRL SZ7.5 (GLOVE) ×4 IMPLANT
GLOVE BIOGEL PI IND STRL 7.5 (GLOVE) ×2 IMPLANT
GLOVE BIOGEL PI INDICATOR 7.5 (GLOVE) ×2
GLOVE SURG SS PI 7.5 STRL IVOR (GLOVE) ×4 IMPLANT
GOWN STRL REUS W/ TWL LRG LVL3 (GOWN DISPOSABLE) ×8 IMPLANT
GOWN STRL REUS W/ TWL XL LVL3 (GOWN DISPOSABLE) ×4 IMPLANT
GOWN STRL REUS W/TWL LRG LVL3 (GOWN DISPOSABLE) ×8
GOWN STRL REUS W/TWL XL LVL3 (GOWN DISPOSABLE) ×4
GUIDEWIRE ANGLED .035X150CM (WIRE) IMPLANT
HEMOSTAT SPONGE AVITENE ULTRA (HEMOSTASIS) IMPLANT
INSERT FOGARTY SM (MISCELLANEOUS) IMPLANT
KIT BASIN OR (CUSTOM PROCEDURE TRAY) ×4 IMPLANT
KIT ROOM TURNOVER OR (KITS) ×4 IMPLANT
MARKER GRAFT CORONARY BYPASS (MISCELLANEOUS) IMPLANT
NEEDLE PERC 18GX7CM (NEEDLE) ×4 IMPLANT
NS IRRIG 1000ML POUR BTL (IV SOLUTION) ×12 IMPLANT
PACK PERIPHERAL VASCULAR (CUSTOM PROCEDURE TRAY) ×4 IMPLANT
PACK SURGICAL SETUP 50X90 (CUSTOM PROCEDURE TRAY) ×4 IMPLANT
PAD ARMBOARD 7.5X6 YLW CONV (MISCELLANEOUS) ×8 IMPLANT
PROTECTION STATION PRESSURIZED (MISCELLANEOUS) ×4
SET COLLECT BLD 21X3/4 12 (NEEDLE) ×4 IMPLANT
SET MICROPUNCTURE 5F STIFF (MISCELLANEOUS) ×4 IMPLANT
SHEATH AVANTI 11CM 5FR (MISCELLANEOUS) ×4 IMPLANT
SHEATH PINNACLE 8F 10CM (SHEATH) ×4 IMPLANT
SPONGE GAUZE 2X2 STER 10/PKG (GAUZE/BANDAGES/DRESSINGS) ×2
STAPLER VISISTAT 35W (STAPLE) ×4 IMPLANT
STATION PROTECTION PRESSURIZED (MISCELLANEOUS) ×2 IMPLANT
STOPCOCK 4 WAY LG BORE MALE ST (IV SETS) ×4 IMPLANT
STOPCOCK MORSE 400PSI 3WAY (MISCELLANEOUS) ×4 IMPLANT
SUT ETHILON 3 0 PS 1 (SUTURE) IMPLANT
SUT GORETEX 6.0 TT13 (SUTURE) IMPLANT
SUT GORETEX 6.0 TT9 (SUTURE) IMPLANT
SUT MNCRL AB 4-0 PS2 18 (SUTURE) IMPLANT
SUT PROLENE 5 0 C 1 24 (SUTURE) IMPLANT
SUT PROLENE 6 0 BV (SUTURE) ×12 IMPLANT
SUT PROLENE 7 0 BV 1 (SUTURE) ×8 IMPLANT
SUT SILK 2 0 SH (SUTURE) IMPLANT
SUT SILK 3 0 (SUTURE)
SUT SILK 3-0 18XBRD TIE 12 (SUTURE) IMPLANT
SUT VIC AB 2-0 CT1 27 (SUTURE) ×2
SUT VIC AB 2-0 CT1 TAPERPNT 27 (SUTURE) ×2 IMPLANT
SUT VIC AB 3-0 SH 27 (SUTURE) ×6
SUT VIC AB 3-0 SH 27X BRD (SUTURE) ×6 IMPLANT
SYR 10ML LL (SYRINGE) ×12 IMPLANT
SYR 20CC LL (SYRINGE) ×4 IMPLANT
SYR 30ML LL (SYRINGE) ×4 IMPLANT
SYR 3ML LL SCALE MARK (SYRINGE) ×4 IMPLANT
SYR MEDRAD MARK V 150ML (SYRINGE) IMPLANT
TAPE CLOTH SURG 4X10 WHT LF (GAUZE/BANDAGES/DRESSINGS) ×8 IMPLANT
TOWEL GREEN STERILE (TOWEL DISPOSABLE) ×4 IMPLANT
TRAY FOLEY W/METER SILVER 16FR (SET/KITS/TRAYS/PACK) ×4 IMPLANT
TUBING ART PRESS 72  MALE/FEM (TUBING) ×2
TUBING ART PRESS 72 MALE/FEM (TUBING) ×2 IMPLANT
TUBING HIGH PRESSURE 120CM (CONNECTOR) IMPLANT
UNDERPAD 30X30 (UNDERPADS AND DIAPERS) ×4 IMPLANT
WATER STERILE IRR 1000ML POUR (IV SOLUTION) ×4 IMPLANT
WIRE BENTSON .035X145CM (WIRE) ×4 IMPLANT

## 2016-08-21 NOTE — Anesthesia Postprocedure Evaluation (Signed)
Anesthesia Post Note  Patient: Edward Summers  Procedure(s) Performed: Procedure(s) (LRB): LOWER EXTREMITY ANGIOGRAM (Right) Right Lower Extremity Thromboembolectomy with Injection of Alteplase into Right Anterior Tibial Artery and Right Anterior Peroneal Artery (Right)     Patient location during evaluation: PACU Anesthesia Type: General Level of consciousness: awake and alert Pain management: pain level controlled Respiratory status: spontaneous breathing, nonlabored ventilation, respiratory function stable and patient connected to nasal cannula oxygen Cardiovascular status: blood pressure returned to baseline and tachycardic Postop Assessment: no signs of nausea or vomiting Anesthetic complications: no Comments: Patient developed rapid a-fib shortly after arrival to PACU, confirmed with EKG. Patient denies history of A-fib, and denies any chest pain, SOB, or light-headedness. Esmolol given, bringing HR down from 180's to 130's. Metoprolol also used to further lower HR. Hospitalist notified, present in PACU to evaluate patient.    Last Vitals:  Vitals:   08/21/16 2130 08/21/16 2135  BP:  101/60  Pulse: (!) 146 (!) 34  Resp: (!) 21 17  Temp:    SpO2: 98% 98%    Last Pain:  Vitals:   08/21/16 2125  TempSrc:   PainSc: 0-No pain                 Audry Pili

## 2016-08-21 NOTE — Progress Notes (Signed)
   Subjective:    Patient ID: Edward Summers, male    DOB: Mar 10, 1940, 76 y.o.   MRN: [id number]  HPI  Chief Complaint  Patient presents with  . Foot Pain    Swollen and painful B/L       Review of Systems  Constitutional: Positive for unexpected weight change.  Respiratory: Positive for cough and shortness of breath.   Musculoskeletal: Positive for gait problem.  All other systems reviewed and are negative.      Objective:   Physical Exam        Assessment & Plan:

## 2016-08-21 NOTE — Anesthesia Procedure Notes (Addendum)
Procedure Name: Intubation Date/Time: 08/21/2016 5:32 PM Performed by: Everlean Cherry A Pre-anesthesia Checklist: Patient identified, Suction available, Patient being monitored and Emergency Drugs available Patient Re-evaluated:Patient Re-evaluated prior to induction Oxygen Delivery Method: Circle system utilized Preoxygenation: Pre-oxygenation with 100% oxygen Induction Type: IV induction, Rapid sequence and Cricoid Pressure applied Laryngoscope Size: Miller Grade View: Grade I Tube type: Oral Tube size: 8.0 mm Number of attempts: 1 Airway Equipment and Method: Stylet Placement Confirmation: ETT inserted through vocal cords under direct vision,  positive ETCO2 and breath sounds checked- equal and bilateral Secured at: 22 cm Tube secured with: Tape Dental Injury: Teeth and Oropharynx as per pre-operative assessment

## 2016-08-21 NOTE — Progress Notes (Signed)
Patient respiratory assessment done and patient scored a 10. Patient having breathing issues at this time due to possible COPD and Pneumonia in the lungs. BBS heard with some congestion that clears with cough and no wheezes throughout the lung fields. Patient doesn't use respiratory medicine regularly at home at this time, but may need to be assessed prior to discharge. Xopenex and Atrovent ordered TID for now and will continue to check per RT protocol for any issues or changes to regimen.

## 2016-08-21 NOTE — H&P (Signed)
HP   History of Present Illness: This is a 76 y.o. male with pmh htn and hld and previous smoker. He has a 1 week history of cold right foot. Has 2 week history of left leg swelling. Pain in right foot has gotten to point he cannot sleep. Still able to walk with limp. Has associated numbness. Denies previous vascular issues or cardiac issues.   Past medical history: htn, hld Past surgical history: back surgery Family history: non-contributory  Allergies  Allergen Reactions  . Zithromax [Azithromycin] Other (See Comments)    Side pain    Prior to Admission medications   Medication Sig Start Date End Date Taking? Authorizing Provider  atorvastatin (LIPITOR) 40 MG tablet  05/15/16   [provider]  HYDROMET 5-1.5 MG/5ML syrup TK 5 ML PO Q 4 H PRN 08/05/16   [provider]  lisinopril (PRINIVIL,ZESTRIL) 10 MG tablet  07/26/16   [provider]  nystatin (MYCOSTATIN) 100000 UNIT/ML suspension  07/14/16   [provider]  predniSONE (DELTASONE) 10 MG tablet  08/20/16   [provider]    Social History   Social History  . Marital status: Divorced    Spouse name: N/A  . Number of children: N/A  . Years of education: N/A   Occupational History  . Not on file.   Social History Main Topics  . Smoking status: Former Smoker    Quit date: 07/06/2016  . Smokeless tobacco: Never Used  . Alcohol use No  . Drug use: No  . Sexual activity: Not on file   Other Topics Concern  . Not on file   Social History Narrative  . No narrative on file    History reviewed. No pertinent family history.  REVIEW OF SYSTEMS (negative unless checked):   Cardiac:  []  Chest pain or chest pressure? []  Shortness of breath upon activity? []  Shortness of breath when lying flat? []  Irregular heart rhythm?  Vascular:  []  Pain in calf, thigh, or hip brought on by walking? [x]  Pain in feet at night that wakes you up from your sleep? []  Blood clot in your  veins? [x]  Leg swelling?  Pulmonary:  []  Oxygen at home? []  Productive cough? []  Wheezing?  Neurologic:  []  Sudden weakness in arms or legs? []  Sudden numbness in arms or legs? []  Sudden onset of difficult speaking or slurred speech? []  Temporary loss of vision in one eye? []  Problems with dizziness?  Gastrointestinal:  []  Blood in stool? []  Vomited blood?  Genitourinary:  []  Burning when urinating? []  Blood in urine?  Psychiatric:  []  Major depression  Hematologic:  []  Bleeding problems? []  Problems with blood clotting?  Dermatologic:  []  Rashes or ulcers?  Constitutional:  []  Fever or chills?  Ear/Nose/Throat:  []  Change in hearing? []  Nose bleeds? []  Sore throat?  Musculoskeletal:  []  Back pain? []  Joint pain? [x]  Muscle pain?    Physical Examination  Vitals:   08/21/16 1545 08/21/16 1615  BP: 126/79 114/74  Pulse: 77 78  Resp: 16 (!) 23  Temp:    SpO2: 97% 97%   There is no height or weight on file to calculate BMI.  General:  WDWN in NAD HENT: WNL, normocephalic Pulmonary: normal non-labored breathing Cardiac: rrr 2+ femoral pulses bilaterally 1+ popliteal pulses Monophasic left dp/pt signals No signals appreciable in right foot Abdomen: soft, ntnd, no palpable masses or pulsation Extremities: right foot is cool with minimal capillary refill Left foot with 2+ pitting edema Neurologic:  right foot is feels like stocking Motor strength 5/5 all extremities  CBC    Component Value Date/Time   WBC 27.6 (H) 08/21/2016 1628   RBC 4.75 08/21/2016 1628   HGB 13.7 08/21/2016 1628   HCT 40.0 08/21/2016 1628   PLT 176 08/21/2016 1628   MCV 84.2 08/21/2016 1628   MCH 28.8 08/21/2016 1628   MCHC 34.3 08/21/2016 1628   RDW 15.9 (H) 08/21/2016 1628   LYMPHSABS PENDING 08/21/2016 1628   MONOABS PENDING 08/21/2016 1628   EOSABS PENDING 08/21/2016 1628   BASOSABS PENDING 08/21/2016 1628    BMET No results found for: NA, K, CL, CO2,  GLUCOSE, BUN, CREATININE, CALCIUM, GFRNONAA, GFRAA  COAGS: No results found for: INR, PROTIME    ASSESSMENT/PLAN: This is a 76 y.o. male with rutherford 2a acute limb ischemia. Has faint popliteal pulse on right. Discussed with him proceeding with right lower extremity angiogram that will likely be diagnostic followed by possible thrombectomy, bypass and if revascularization is successful fasciotomies. He understands the risks and benefits. Does not have poa or close family.   Cici Rodriges C. Donzetta Matters, MD [facility] Office: [phone] Pager: [phone]

## 2016-08-21 NOTE — Consult Note (Signed)
Medical Consultation   Edward Summers  FMB:[id number]  DOB: December 07, 1940  DOA: 08/21/2016  PCP: Edward Panda, MD   Outpatient Specialists:   Requesting physician: Dr. Doran Summers  Reason for consultation: New onset of atrial fibrillation with RVR up to surgery  History of Present Illness: Edward Summers is an 76 y.o. male hypertension, hyperlipidemia, emphysema, who was admitted by vascular surgery service for acute left leg limb ischemia. Pt is now POD 0 after surgical thromboembolectomy of R popliteal PT and AT arteries. Pt was stared with IV heparin by Dr. Donzetta Matters. Per report, pt developed new onset A fib with RVR in PACU after the surgery. When I saw pt in PACU, his heart rate is 150 to 180s. Bp is soft. Mental status normal. Patient denies chest pain, but has mild SOB.  He has nonproductive cough. No fever or chills. Patient does not have nausea, vomiting, diarrhea, abdominal pain or symptoms of UTI. He states that he was recently treated with tapering dose of prednisone by his doctor due to chronic productive cough.  Patient was found to have WBC 27.6, INR 1.14, BNP 53.8, electrolytes renal function okay, temperature normal, tachycardia, O2 sat 91-93% on room air. Chest x-ray showed right lower lobe atelectasis vs. infiltration.   Review of Systems:  General: no fevers, chills, no changes in body weight Skin: no rash HEENT: no blurry vision, hearing changes or sore throat Pulm: has dyspnea, coughing, wheezing CV: no chest pain, palpitations, shortness of breath Abd: no nausea/vomiting, abdominal pain, diarrhea/constipation GU: no dysuria, hematuria, polyuria Ext: no arthralgias, myalgias. Has leg edema. Neuro: no weakness, numbness, or tingling  Past Medical History: Past Medical History:  Diagnosis Date  . Arthritis    "probably; get sore all over" (08/21/2016)  . Dyspnea   . Emphysema of lung ([place])    "from smoking" (08/21/2016)  . History of gout X 1  .  HLD (hyperlipidemia)    [name] 08/21/2016  . Hypertension   . Pneumonia 1970s    Past Surgical History: Past Surgical History:  Procedure Laterality Date  . BACK SURGERY    . CATARACT EXTRACTION W/ INTRAOCULAR LENS IMPLANT Right   . LOWER EXTREMITY ANGIOGRAM Right 08/21/2016   [name] 08/21/2016  . LUMBAR DISC SURGERY     Dr. Arnoldo Morale  . THROMBECTOMY Right 08/21/2016   Thromboembolectomy of right popliteal, pt and AT arteries/notes 08/21/2016  . TONSILLECTOMY       Allergies:   Allergies  Allergen Reactions  . Zithromax [Azithromycin] Other (See Comments)    Side pain     Social History:  reports that he quit smoking about 6 weeks ago. His smoking use included Cigarettes. He has a 85.50 pack-year smoking history. He has never used smokeless tobacco. He reports that he drinks alcohol. He reports that he does not use drugs.   Family History:Reviewed with patient, but patient does not remember any family medical history.   Physical Exam: Vitals:   08/21/16 2215 08/21/16 2230 08/21/16 2258 08/21/16 2325  BP: 118/84 96/65 (!) 104/57   Pulse: (!) 50 (!) 116 (!) 29   Resp: 18 (!) 25 18 16   Temp:  (!) 97.5 F (36.4 C) 98.2 F (36.8 C)   TempSrc:   Oral   SpO2: 99% 92% 100% 95%  Weight:   71.2 kg (156 lb 14.4 oz)   Height:   5\' 7"  (1.702 m)  General: Not in acute distress HEENT:       Eyes: PERRL, EOMI, no scleral icterus.       ENT: No discharge from the ears and nose, no pharynx injection, no tonsillar enlargement.        Neck: No JVD, no bruit, no mass felt. Heme: No neck lymph node enlargement. Cardiac: S1/S2, RRR, No murmurs, No gallops or rubs. Respiratory: has wheezing, rhonchi bilaterally GI: Soft, nondistended, nontender, no rebound pain, no organomegaly, BS present. GU: No hematuria Ext: has 2+ pitting leg edema bilaterally (L is worse than right). 2+DP/PT pulse bilaterally. Musculoskeletal: No joint deformities, No joint redness or warmth, no limitation of  ROM in spin. Skin: No rashes.  Neuro: Alert, oriented X3, cranial nerves II-XII grossly intact, moves all extremities normally.  Psych: Patient is not psychotic, no suicidal or hemocidal ideation.  Data reviewed:  I have personally reviewed following labs and imaging studies Labs:  CBC:  Recent Labs Lab 08/21/16 1628  WBC 27.6*  NEUTROABS 22.1*  HGB 13.7  HCT 40.0  MCV 84.2  PLT 314    Basic Metabolic Panel:  Recent Labs Lab 08/21/16 1628  NA 134*  K 4.3  CL 101  CO2 24  GLUCOSE 113*  BUN 12  CREATININE 0.78  CALCIUM 8.2*   GFR Estimated Creatinine Clearance: 75.7 mL/min (by C-G formula based on SCr of 0.78 mg/dL). Liver Function Tests:  Recent Labs Lab 08/21/16 1628  AST 22  ALT 31  ALKPHOS 85  BILITOT 1.2  PROT 5.5*  ALBUMIN 2.2*   No results for input(s): LIPASE, AMYLASE in the last 168 hours. No results for input(s): AMMONIA in the last 168 hours. Coagulation profile  Recent Labs Lab 08/21/16 1628  INR 1.14    Cardiac Enzymes:  Recent Labs Lab 08/21/16 2141  TROPONINI 0.03*   BNP: Invalid input(s): POCBNP CBG: No results for input(s): GLUCAP in the last 168 hours. D-Dimer No results for input(s): DDIMER in the last 72 hours. Hgb A1c No results for input(s): HGBA1C in the last 72 hours. Lipid Profile No results for input(s): CHOL, HDL, LDLCALC, TRIG, CHOLHDL, LDLDIRECT in the last 72 hours. Thyroid function studies No results for input(s): TSH, T4TOTAL, T3FREE, THYROIDAB in the last 72 hours.  Invalid input(s): FREET3 Anemia work up No results for input(s): VITAMINB12, FOLATE, FERRITIN, TIBC, IRON, RETICCTPCT in the last 72 hours. Urinalysis No results found for: COLORURINE, APPEARANCEUR, LABSPEC, [place], GLUCOSEU, HGBUR, BILIRUBINUR, KETONESUR, PROTEINUR, UROBILINOGEN, NITRITE, [place]   Microbiology No results found for this or any previous visit (from the past 240 hour(s)).     Inpatient Medications:   Scheduled  Meds: . atorvastatin  40 mg Oral q1800  . dextromethorphan-guaiFENesin  1 tablet Oral BID  . docusate sodium  100 mg Oral Daily  . doxycycline  100 mg Oral Q12H  . ipratropium  0.5 mg Nebulization TID  . levalbuterol  1.25 mg Nebulization TID  . methylPREDNISolone (SOLU-MEDROL) injection  60 mg Intravenous Q8H  . metoprolol tartrate      . morphine   Intravenous Q4H  . pantoprazole  40 mg Oral Daily   Continuous Infusions: . sodium chloride    . sodium chloride 125 mL/hr at 08/21/16 2045  . amiodarone 60 mg/hr (08/22/16 0126)  . amiodarone    . cefUROXime (ZINACEF)  IV Stopped (08/21/16 2351)  . heparin 750 Units/hr (08/21/16 2221)  . lactated ringers 10 mL/hr at 08/21/16 1709  . magnesium sulfate 1 - 4 g bolus IVPB  Radiological Exams on Admission: Dg Chest Portable 1 View  Result Date: 08/21/2016 CLINICAL DATA:  Shortness of breath, bilateral lower extremity edema and discoloration. EXAM: PORTABLE CHEST 1 VIEW COMPARISON:  PA and lateral chest x-ray of July 04, 2016 FINDINGS: The left lung is well-expanded and clear. On the right there is increased density at the lung base. The heart is mildly enlarged. The pulmonary vascularity is normal. There is no pleural effusion. There is calcification in the wall of the aortic arch. The bony thorax exhibits no acute abnormality. IMPRESSION: Right lower lobe atelectasis or pneumonia. Probable underlying COPD or reactive airway disease. Followup PA and lateral chest X-ray is recommended in 3-4 weeks following trial of antibiotic therapy to ensure resolution and exclude underlying malignancy. No evidence of pulmonary edema or pleural effusions. Electronically Signed   By: David  Martinique M.D.   On: 08/21/2016 15:33   Dg Foot 2 Views Left  Result Date: 08/21/2016 Please see detailed radiograph report in office note.  Dg Foot 2 Views Right  Result Date: 08/21/2016 Please see detailed radiograph report in office note.   EKG: Independently  reviewed. Atrial fibrillation, QTC 504, mild ST depression diffusely   Impression/Recommendations Principal Problem:   Ischemic pain of foot, right Active Problems:   Leg swelling   New onset atrial fibrillation (HCC)   Atrial fibrillation with RVR (HCC)   HLD (hyperlipidemia)   Essential hypertension   Ischemic pain of foot, right: POD 0 after surgical thromboembolectomy of R popliteal PT and AT arteries. Pt was stared with IV heparin by Dr. Donzetta Matters. -post surgical management per primary surgery team -on IV heparin -On IV Cefuroxime -pain control  New onset atrial fibrillation with RVR: Patient's heart rate is a 150-180s, blood pressure is soft. Dose not allow IV Cardizem drip. Cardiology, Dr.Chakravartti was then consulted--> recommended to start IV amiodarone. CHA2DS2-VASc Score is 2, needs oral anticoagulation. Patient is on IV heparin.  Per Dr. [place] Lions,  "would consider rechallenging with low dose IV metoprolol (2.5 mg) again or 10 mg IV diltiazem.  If BP is borderline, would recommend loading with IV digoxin for aid in rate control". - start IV amiodarone -Continue IV heparin - Risk factor stratification: will check FLP, UDS and A1C  - check TSH, Free T4 and T3 - 2d echo - check Mg level  HTN: Bp is soft. -Hold home Bp meds  HLD:  -Lipitor  COPD exacerbation: Patient was a heavy smoker, quit smoke 5 weeks ago. Patient has chronic cough. Also has rhonchi and wheezing on auscultation, indicating patient may have undiagnosed COPD with exacerbation. Chest x-ray showed right lower lobe atelectasis versus infiltration, but patient does not have fever, clinically does not seem to have a pneumonia. -pt is on IV Cefuroxime per surgeon -will add doxycycline -Nebulizers: Scheduled Atrovent and prn Xopenex nebs -Solu-Medrol 60 mg IV q8h  -Mucinex for cough   Thank you for this consultation.  Our [facility] hospitalist team will follow the patient with you.   Time Spent: 45  min   Ivor Costa M.D. [facility] 08/22/2016, 3:34 AM

## 2016-08-21 NOTE — ED Provider Notes (Signed)
[facility] DEPT Provider Note   CSN: [id number] Arrival date & time: 08/21/16  1133     History   Chief Complaint Chief Complaint  Patient presents with  . Leg Swelling    HPI LINKYN GOBIN is a 76 y.o. male. With history of HTN, HLD who presents from podiatry clinic with complaints of right lower extremity pain. He reports swelling of bilateral lower extremities for the past 2 weeks. He noticed increased pain in right foot for the past week. Seen by Podiatry today for foot complaints, told to come to ED. He also reports increased shortness of breath with exertion for the past month. Denies any previous cardiac history, heart failure, heart attacks or lung disease. Quit smoking 5 weeks ago.   HPI  Past Medical History:  Diagnosis Date  . Arthritis    "probably; get sore all over" (08/21/2016)  . Dyspnea   . Emphysema of lung ([place])    "from smoking" (08/21/2016)  . History of gout X 1  . HLD (hyperlipidemia)    [name] 08/21/2016  . Hypertension   . Pneumonia 1970s    Patient Active Problem List   Diagnosis Date Noted  . Leg swelling 08/21/2016  . Ischemic pain of foot, right 08/21/2016    Past Surgical History:  Procedure Laterality Date  . BACK SURGERY    . CATARACT EXTRACTION W/ INTRAOCULAR LENS IMPLANT Right   . LOWER EXTREMITY ANGIOGRAM Right 08/21/2016   [name] 08/21/2016  . LUMBAR DISC SURGERY     Dr. Arnoldo Morale  . THROMBECTOMY Right 08/21/2016   Thromboembolectomy of right popliteal, pt and AT arteries/notes 08/21/2016  . TONSILLECTOMY         Home Medications    Prior to Admission medications   Medication Sig Start Date End Date Taking? Authorizing Provider  atorvastatin (LIPITOR) 40 MG tablet  05/15/16   [provider]  HYDROMET 5-1.5 MG/5ML syrup TK 5 ML PO Q 4 H PRN 08/05/16   [provider]  lisinopril (PRINIVIL,ZESTRIL) 10 MG tablet  07/26/16   [provider]  nystatin (MYCOSTATIN) 100000 UNIT/ML suspension  07/14/16    [provider]  predniSONE (DELTASONE) 10 MG tablet  08/20/16   [provider]    Family History History reviewed. No pertinent family history.  Social History Social History  Substance Use Topics  . Smoking status: Former Smoker    Packs/day: 1.50    Years: 57.00    Types: Cigarettes    Quit date: 07/06/2016  . Smokeless tobacco: Never Used  . Alcohol use Yes     Comment: 08/21/2016 "quit ~ 40 yr ago"     Allergies   Zithromax [azithromycin]   Review of Systems Review of Systems  Constitutional: Positive for fever (subjective). Negative for chills.  HENT: Negative for ear pain and sore throat.   Eyes: Negative for pain and visual disturbance.  Respiratory: Positive for cough and shortness of breath.   Cardiovascular: Positive for leg swelling. Negative for chest pain and palpitations.  Gastrointestinal: Negative for abdominal pain, diarrhea, nausea and vomiting.  Genitourinary: Negative for dysuria, flank pain and hematuria.  Musculoskeletal: Negative for arthralgias and back pain.  Skin: Negative for color change and rash.  Neurological: Negative for dizziness, seizures, syncope and light-headedness.  All other systems reviewed and are negative.    Physical Exam Updated Vital Signs BP (!) 104/57 (BP Location: Left Arm)   Pulse (!) 29   Temp 98.2 F (36.8 C) (Oral)   Resp  16   Ht 5\' 7"  (1.702 m)   Wt 71.2 kg (156 lb 14.4 oz)   SpO2 95%   BMI 24.57 kg/m   Physical Exam  Constitutional: He appears well-developed and well-nourished.  HENT:  Head: Normocephalic and atraumatic.  Eyes: Conjunctivae are normal.  Neck: Neck supple.  Cardiovascular: Normal rate and regular rhythm.   No murmur heard. Pulmonary/Chest: Effort normal and breath sounds normal. No respiratory distress.  Abdominal: Soft. There is no tenderness.  Musculoskeletal: He exhibits edema and tenderness.  Right foot dusky colored, cool   Neurological: He is alert.  Skin:  Skin is warm and dry.  Psychiatric: He has a normal mood and affect.  Nursing note and vitals reviewed.    ED Treatments / Results  Labs (all labs ordered are listed, but only abnormal results are displayed) Labs Reviewed  CBC WITH DIFFERENTIAL/PLATELET - Abnormal; Notable for the following:       Result Value   WBC 27.6 (*)    RDW 15.9 (*)    Neutro Abs 22.1 (*)    Monocytes Absolute 2.2 (*)    All other components within normal limits  COMPREHENSIVE METABOLIC PANEL - Abnormal; Notable for the following:    Sodium 134 (*)    Glucose, Bld 113 (*)    Calcium 8.2 (*)    Total Protein 5.5 (*)    Albumin 2.2 (*)    All other components within normal limits  TROPONIN I - Abnormal; Notable for the following:    Troponin I 0.03 (*)    All other components within normal limits  CULTURE, EXPECTORATED SPUTUM-ASSESSMENT  BRAIN NATRIURETIC PEPTIDE  PROTIME-INR  TROPONIN I  TROPONIN I  HEMOGLOBIN A1C  LIPID PANEL  RAPID URINE DRUG SCREEN, HOSP PERFORMED  TSH  T4, FREE  T3, FREE  BRAIN NATRIURETIC PEPTIDE  CORTISOL-AM, BLOOD  HEPARIN LEVEL (UNFRACTIONATED)  CBC  BASIC METABOLIC PANEL    EKG  EKG Interpretation  Date/Time:  Thursday August 21 2016 16:26:56 EDT Ventricular Rate:  75 PR Interval:    QRS Duration: 90 QT Interval:  390 QTC Calculation: 436 R Axis:   75 Text Interpretation:  Sinus rhythm Atrial premature complex Baseline wander in lead(s) V2 V6 No STEMI.  Confirmed by Nanda Quinton [phone]) on 08/21/2016 4:52:15 PM       Radiology Dg Chest Portable 1 View  Result Date: 08/21/2016 CLINICAL DATA:  Shortness of breath, bilateral lower extremity edema and discoloration. EXAM: PORTABLE CHEST 1 VIEW COMPARISON:  PA and lateral chest x-ray of July 04, 2016 FINDINGS: The left lung is well-expanded and clear. On the right there is increased density at the lung base. The heart is mildly enlarged. The pulmonary vascularity is normal. There is no pleural effusion. There  is calcification in the wall of the aortic arch. The bony thorax exhibits no acute abnormality. IMPRESSION: Right lower lobe atelectasis or pneumonia. Probable underlying COPD or reactive airway disease. Followup PA and lateral chest X-ray is recommended in 3-4 weeks following trial of antibiotic therapy to ensure resolution and exclude underlying malignancy. No evidence of pulmonary edema or pleural effusions. Electronically Signed   By: David  Martinique M.D.   On: 08/21/2016 15:33   Dg Foot 2 Views Left  Result Date: 08/21/2016 Please see detailed radiograph report in office note.  Dg Foot 2 Views Right  Result Date: 08/21/2016 Please see detailed radiograph report in office note.   Procedures Procedures (including critical care time)  Medications Ordered in ED Medications  lactated ringers infusion ( Intravenous Anesthesia Volume Adjustment 08/21/16 1957)  atorvastatin (LIPITOR) tablet 40 mg (not administered)  lisinopril (PRINIVIL,ZESTRIL) tablet 10 mg (not administered)  0.9 %  sodium chloride infusion (not administered)  magnesium sulfate IVPB 2 g 50 mL (not administered)  potassium chloride SA (K-DUR,KLOR-CON) CR tablet 20-40 mEq (not administered)  cefUROXime (ZINACEF) 1.5 g in dextrose 5 % 50 mL IVPB (0 g Intravenous Stopped 08/21/16 2351)  acetaminophen (TYLENOL) tablet 325-650 mg (not administered)    Or  acetaminophen (TYLENOL) suppository 325-650 mg (not administered)  docusate sodium (COLACE) capsule 100 mg (not administered)  ondansetron (ZOFRAN) injection 4 mg (not administered)  alum & mag hydroxide-simeth (MAALOX/MYLANTA) 200-200-20 MG/5ML suspension 15-30 mL (not administered)  pantoprazole (PROTONIX) EC tablet 40 mg (not administered)  labetalol (NORMODYNE,TRANDATE) injection 10 mg (not administered)  hydrALAZINE (APRESOLINE) injection 5 mg (not administered)  metoprolol tartrate (LOPRESSOR) injection 2-5 mg (not administered)  guaiFENesin-dextromethorphan (ROBITUSSIN  DM) 100-10 MG/5ML syrup 15 mL (not administered)  phenol (CHLORASEPTIC) mouth spray 1 spray (not administered)  0.9 %  sodium chloride infusion ( Intravenous New Bag/Given 08/21/16 2045)  oxyCODONE-acetaminophen (PERCOCET/ROXICET) 5-325 MG per tablet 1-2 tablet (not administered)  senna-docusate (Senokot-S) tablet 1 tablet (not administered)  bisacodyl (DULCOLAX) EC tablet 5 mg (not administered)  naloxone (NARCAN) injection 0.4 mg (not administered)    And  sodium chloride flush (NS) 0.9 % injection 9 mL (not administered)  ondansetron (ZOFRAN) injection 4 mg (not administered)  diphenhydrAMINE (BENADRYL) injection 12.5 mg (not administered)    Or  diphenhydrAMINE (BENADRYL) 12.5 MG/5ML elixir 12.5 mg (not administered)  morphine 2 mg/mL PCA injection (0 mg Intravenous Received 08/21/16 2325)  metoprolol tartrate (LOPRESSOR) 5 MG/5ML injection (not administered)  methylPREDNISolone sodium succinate (SOLU-MEDROL) 125 mg/2 mL injection 60 mg (not administered)  dextromethorphan-guaiFENesin (MUCINEX DM) 30-600 MG per 12 hr tablet 1 tablet (1 tablet Oral Given 08/21/16 2307)  doxycycline (VIBRA-TABS) tablet 100 mg (100 mg Oral Given 08/21/16 2308)  heparin ADULT infusion 100 units/mL (25000 units/230mL sodium chloride 0.45%) (750 Units/hr Intravenous New Bag/Given 08/21/16 2221)  amiodarone (NEXTERONE PREMIX) 360-4.14 MG/200ML-% (1.8 mg/mL) IV infusion (60 mg/hr Intravenous New Bag/Given 08/21/16 2239)  amiodarone (NEXTERONE PREMIX) 360-4.14 MG/200ML-% (1.8 mg/mL) IV infusion (not administered)  ipratropium (ATROVENT) nebulizer solution 0.5 mg (not administered)  levalbuterol (XOPENEX) nebulizer solution 1.25 mg (not administered)  levalbuterol (XOPENEX) nebulizer solution 0.63 mg (not administered)  HYDROcodone-acetaminophen (NORCO) 7.5-325 MG per tablet 1 tablet (1 tablet Oral Given 08/21/16 1637)  metoprolol tartrate (LOPRESSOR) injection 5 mg (2.5 mg Intravenous Given 08/21/16 2036)  sodium  chloride 0.9 % bolus 1,000 mL (1,000 mLs Intravenous New Bag/Given 08/21/16 2121)  hydrocortisone sodium succinate (SOLU-CORTEF) 100 MG injection 100 mg (100 mg Intravenous Given 08/21/16 2137)  amiodarone (NEXTERONE) 1.8 mg/mL load via infusion 150 mg (150 mg Intravenous Bolus from Bag 08/21/16 2225)     Initial Impression / Assessment and Plan / ED Course  I have reviewed the triage vital signs and the nursing notes.  Pertinent labs & imaging results that were available during my care of the patient were reviewed by me and considered in my medical decision making (see chart for details).     Patient is a 76 y/o male with history of HTN, HLD who presents with symptoms concerning for ischemic foot. Patient arrived hemodynamically stable, in mild distress. Exam otherwise as above. Unable to obtain Doppler pulses of his right lower extremity  Due to concern for ischemic foot, vascular surgery consultation. Heparin  ordered. Plan for admission for right lower extremity angiogram with possible thrombectomy, bypass and revascularization. Patient in agreement with plan at time of admission.  Patient and plan of care discussed with Attending physician, Dr. Laverta Baltimore.    Final Clinical Impressions(s) / ED Diagnoses   Final diagnoses:  Ischemic foot    New Prescriptions Current Discharge Medication List       Arnetha Massy, MD 08/22/16 0112    Margette Fast, MD 08/22/16 [phone]

## 2016-08-21 NOTE — Consult Note (Signed)
Cardiology Consultation Note    Patient ID: Edward Summers, MRN: [id number], DOB/AGE: 75-Mar-1942 75 y.o. Admit date: 08/21/2016   Date of Consult: 08/21/2016 Primary Physician: Jilda Panda, MD   Reason for Consultation: AF with RVR Requesting MD: Dr. Blaine Hamper  HPI: Edward Summers is a 76 y.o. male who is currently admitted to the vascular surgery service for acute limb ischemia, now POD 0 after surgical thromboembolectomy of R popliteal PT, and AT arteries.  Cardiology is consulted for management of atrial fibrillation with RVR, which was noted in the PACU post operatively.  Per the patient, he has never had any notable history of cardiovascular issues.  In the PACU, pt was in AF with rates in the 150s-170s.  His SBP after 2.5 mg IV metoprolol was in the 90s, so IV amiodarone bolus and infusion were started, and he received 1 L IVF.  Pt is being anticoagulated with heparin post procedure.  Upon my exam, pt's heart rates were in the 150s, and BP was 118/84.  He denied any CP, palpitations or lightheadedness.  Pt did report very mild SOB.  Cardiovascular ROS was only notable for LE swelling in the previous 3-4 weeks (L > R).  Past Medical History:  Diagnosis Date  . Arthritis    "probably; get sore all over" (08/21/2016)  . Dyspnea   . Emphysema of lung ([place])    "from smoking" (08/21/2016)  . History of gout X 1  . HLD (hyperlipidemia)    [name] 08/21/2016  . Hypertension   . Pneumonia 1970s      Surgical History:  Past Surgical History:  Procedure Laterality Date  . BACK SURGERY    . CATARACT EXTRACTION W/ INTRAOCULAR LENS IMPLANT Right   . LOWER EXTREMITY ANGIOGRAM Right 08/21/2016   [name] 08/21/2016  . LUMBAR DISC SURGERY     Dr. Arnoldo Morale  . THROMBECTOMY Right 08/21/2016   Thromboembolectomy of right popliteal, pt and AT arteries/notes 08/21/2016  . TONSILLECTOMY       Home Meds: Prior to Admission medications   Medication Sig Start Date End Date Taking? Authorizing Provider    atorvastatin (LIPITOR) 40 MG tablet  05/15/16   [provider]  HYDROMET 5-1.5 MG/5ML syrup TK 5 ML PO Q 4 H PRN 08/05/16   [provider]  lisinopril (PRINIVIL,ZESTRIL) 10 MG tablet  07/26/16   [provider]  nystatin (MYCOSTATIN) 100000 UNIT/ML suspension  07/14/16   [provider]  predniSONE (DELTASONE) 10 MG tablet  08/20/16   [provider]    Inpatient Medications:  . [START ON 08/22/2016] atorvastatin  40 mg Oral q1800  . dextromethorphan-guaiFENesin  1 tablet Oral BID  . [START ON 08/22/2016] docusate sodium  100 mg Oral Daily  . doxycycline  100 mg Oral Q12H  . ipratropium  0.5 mg Nebulization Q4H  . [START ON 08/22/2016] levalbuterol  1.25 mg Nebulization Q6H  . [START ON 08/22/2016] lisinopril  10 mg Oral Daily  . [START ON 08/22/2016] methylPREDNISolone (SOLU-MEDROL) injection  60 mg Intravenous Q8H  . metoprolol tartrate      . morphine   Intravenous Q4H  . [START ON 08/22/2016] pantoprazole  40 mg Oral Daily   . sodium chloride    . sodium chloride 125 mL/hr at 08/21/16 2045  . amiodarone 60 mg/hr (08/21/16 2239)  . [START ON 08/22/2016] amiodarone    . cefUROXime (ZINACEF)  IV    . heparin 750 Units/hr (08/21/16 2221)  . lactated ringers 10  mL/hr at 08/21/16 1709  . magnesium sulfate 1 - 4 g bolus IVPB      Allergies:  Allergies  Allergen Reactions  . Zithromax [Azithromycin] Other (See Comments)    Side pain    Social History   Social History  . Marital status: Divorced    Spouse name: N/A  . Number of children: N/A  . Years of education: N/A   Occupational History  . Not on file.   Social History Main Topics  . Smoking status: Former Smoker    Packs/day: 1.50    Years: 57.00    Types: Cigarettes    Quit date: 07/06/2016  . Smokeless tobacco: Never Used  . Alcohol use Yes     Comment: 08/21/2016 "quit ~ 40 yr ago"  . Drug use: No  . Sexual activity: Not Currently   Other Topics Concern  . Not on file    Social History Narrative  . No narrative on file     History reviewed. No pertinent family history.   Review of Systems: All other systems reviewed and are otherwise negative except as noted above.  Labs:  Recent Labs  08/21/16 2141  TROPONINI 0.03*   Lab Results  Component Value Date   WBC 27.6 (H) 08/21/2016   HGB 13.7 08/21/2016   HCT 40.0 08/21/2016   MCV 84.2 08/21/2016   PLT 176 08/21/2016    Recent Labs Lab 08/21/16 1628  NA 134*  K 4.3  CL 101  CO2 24  BUN 12  CREATININE 0.78  CALCIUM 8.2*  PROT 5.5*  BILITOT 1.2  ALKPHOS 85  ALT 31  AST 22  GLUCOSE 113*   No results found for: CHOL, HDL, LDLCALC, TRIG No results found for: DDIMER  Radiology/Studies:  Dg Chest Portable 1 View  Result Date: 08/21/2016 CLINICAL DATA:  Shortness of breath, bilateral lower extremity edema and discoloration. EXAM: PORTABLE CHEST 1 VIEW COMPARISON:  PA and lateral chest x-ray of July 04, 2016 FINDINGS: The left lung is well-expanded and clear. On the right there is increased density at the lung base. The heart is mildly enlarged. The pulmonary vascularity is normal. There is no pleural effusion. There is calcification in the wall of the aortic arch. The bony thorax exhibits no acute abnormality. IMPRESSION: Right lower lobe atelectasis or pneumonia. Probable underlying COPD or reactive airway disease. Followup PA and lateral chest X-ray is recommended in 3-4 weeks following trial of antibiotic therapy to ensure resolution and exclude underlying malignancy. No evidence of pulmonary edema or pleural effusions. Electronically Signed   By: David  Martinique M.D.   On: 08/21/2016 15:33   Dg Foot 2 Views Left  Result Date: 08/21/2016 Please see detailed radiograph report in office note.  Dg Foot 2 Views Right  Result Date: 08/21/2016 Please see detailed radiograph report in office note.   Wt Readings from Last 3 Encounters:  08/21/16 71.2 kg (156 lb 14.4 oz)    EKG: AF with  RVR, nonspecific ST-TW changes.  Physical Exam: Blood pressure (!) 104/57, pulse (!) 29, temperature 98.2 F (36.8 C), temperature source Oral, resp. rate 18, height 5\' 7"  (1.702 m), weight 71.2 kg (156 lb 14.4 oz), SpO2 100 %. Body mass index is 24.57 kg/m. General: Well developed, well nourished, in no acute distress. Head: Normocephalic, atraumatic, sclera non-icteric, no xanthomas, nares are without discharge.  Neck: Negative for carotid bruits. JVD not elevated. Lungs: Clear bilaterally to auscultation without wheezes, rales, or rhonchi. Breathing is unlabored. Heart: Tachycardic,  irregular. No murmurs, rubs, or gallops appreciated. Abdomen: Soft, non-tender, non-distended with normoactive bowel sounds. No hepatomegaly. No rebound/guarding. No obvious abdominal masses. Msk:  Strength and tone appear normal for age. Extremities: No clubbing or cyanosis. 2+ edema on L, 1+ edema on R.   RLE with c/d/i surgical incisions.  R foot cold to touch. Neuro: Alert and oriented X 3. No facial asymmetry. No focal deficit. Moves all extremities spontaneously. Psych:  Responds to questions appropriately with a normal affect.     Assessment and Plan  85M currently admitted to the vascular surgery service for acute limb ischemia, now POD 0 after surgical thromboembolectomy of R popliteal PT, and AT arteries, found to have AF with RVR post-operatively.  Given initially borderline Bps with metoprolol IV pushes, agree with continuing amiodarone infusion for now.  Continue IV heparin for therapeutic anticoagulation.  If blood pressures remain > 412 systolic and rates remain uncontrolled, would consider rechallenging with low dose IV metoprolol (2.5 mg) again or 10 mg IV diltiazem.  If BP is borderline, would recommend loading with IV digoxin for aid in rate control.  If truly unstable, would proceed to electrical cardioversion.  If AF is persistent despite these pharmacologic measures, may be reasonable to  proceed to TEE-DCCV in 1-2 days.  It is possible that the pt's presenation ischemic limb was precipitated by a cardiac source of embolus I/s/o previously unrecognized AF.  Agree with TTE to check for underlying structural heart disease; would wait until heart rates are controlled before obtaining this study.  These are preliminary recommendations.  Pt to be seen and staffed by cardiology attending in the AM.  Signed, Doylene Canning MD Cardiology Fellow 08/21/2016, 11:18 PM

## 2016-08-21 NOTE — Progress Notes (Signed)
Subjective:    Patient ID: Edward Summers, male   DOB: 76 y.o.   MRN: [id number]   HPI patient presents with caregiver with extreme discomfort in the forefoot right with discoloration of the digits and swelling of the left foot    Review of Systems  All other systems reviewed and are negative.       Objective:  Physical Exam  Constitutional: He appears well-developed and well-nourished.  Musculoskeletal: Normal range of motion.  Neurological: He is alert.  Skin: Skin is dry.  Nursing note and vitals reviewed.  vascular status was significantly diminished right with no palpable PT DP pulses with complete discoloration of the forefoot and the digits of the right foot. Very painful when pressed left foot shows reasonable circulatory status but there is some edema in the foot but it is not significantly tender     Assessment:    Acute vascular crisis right with possibility for emboli or other pathological process     Plan:    Reviewed this with him and caregiver and at this point we are sending him straight to the emergency room and he is to go straight to the emergency room from the office and they agreed that this is what they would do and most likely will require emergency treatment for either emboli or other pathological process

## 2016-08-21 NOTE — Progress Notes (Addendum)
ANTICOAGULATION CONSULT NOTE - Initial Consult  Pharmacy Consult for Heparin Indication: Ischemic Foot  Allergies  Allergen Reactions  . Zithromax [Azithromycin] Other (See Comments)    Side pain    Patient Measurements:    Pt Stated Wt: 150 lb (68 kg) Ht: 5\' 7"  IBW: 66.1 kg Heparin Dosing Weight: 68 kg  Vital Signs: Temp: 98.3 F (36.8 C) (08/16 1415) Temp Source: Oral (08/16 1415) BP: 126/79 (08/16 1545) Pulse Rate: 77 (08/16 1545)  Labs: No results for input(s): HGB, HCT, PLT, APTT, LABPROT, INR, HEPARINUNFRC, HEPRLOWMOCWT, CREATININE, CKTOTAL, CKMB, TROPONINI in the last 72 hours.  CrCl cannot be calculated (No order found.).   Medical History: History reviewed. No pertinent past medical history.  Assessment: 33 YOM who presented to the [facility] on 8/16 with a RLE ischemic foot. Pharmacy consulted to start Heparin for anticoagulation while awaiting vascular evaluation. Hep wt: 68 kg  The patient states he was taking an ASA PTA but no other blood thinners. No recent surgeries or history of stroke noted.   Goal of Therapy:  Heparin level 0.3-0.7 units/ml  Monitor platelets by anticoagulation protocol: Yes   Plan:  1. Heparin 3000 unit bolus x 1 2. Start Heparin at a rate of 1000 units/hr (10 ml/hr) 3. Will continue to monitor for any signs/symptoms of bleeding and will follow up with heparin level in 8 hours   Thank you for allowing pharmacy to be a part of this patient's care.  Alycia Rossetti, PharmD, BCPS Clinical Pharmacist Pager: [phone] Clinical phone for 08/21/2016 from 2:30p-11p: [phone] If after 3:30p, please call main pharmacy at: [phone] 08/21/2016 4:55 PM    ---------------------------------------------------------------------------------------------------------------------- Addendum:  The patient never started the heparin drip prior to intervention by VVS. Intra-op, the patient underwent a thromboembolectomy and was given intra-arterial alteplase  and heparin. Post-op, pharmacy has been consulted to resume heparin starting at 2100 however consult wasn't released until 2140 so will start as soon as possible.   Will hold bolus given intra-op (intra-arterial) heparin and alteplase utilized. Will start conservatively.  Goal range as above.   Plan 1. Start Heparin at a drip rate of 750 units/hr (7.5 ml/hr) 2. Daily HL, CBC 3. Will continue to monitor for any signs/symptoms of bleeding and will follow up with heparin level in 8 hours   Thank you for allowing pharmacy to be a part of this patient's care.  Alycia Rossetti, PharmD, BCPS Clinical Pharmacist Pager: [phone] Clinical phone for 08/21/2016 from 2p-11p: [phone] If after 3:30p, please call main pharmacy at: [phone] 08/21/2016 9:50 PM

## 2016-08-21 NOTE — Op Note (Signed)
Patient name: Edward Summers MRN: [id number] DOB: August 09, 1940 Sex: male  08/21/2016 Pre-operative Diagnosis:  Acute right lower extremity ischemia Post-operative diagnosis:  Same Surgeon:  Erlene Quan C. Donzetta Matters, MD Assistant: Gerri Lins, PA Procedure Performed: 1.  US guided cannulation of right common femoral artery 2.  Right lower extremity angiogram 3.  Thromboembolectomy of right popliteal, pt and AT arteries 4.  Intra-arterial administration of 5mg  tpa  Indications:  76 year old male without significant past vascular history present with an 8 day history of cold and painful right foot. He still maintained motor use of the foot but had some beginnings of diminished sensation consistent with Ruther[place] 2a acute limb ischemia. I discussed with him angiogram with likely open intervention and I likelihood of amputation during this hospitalization.  Findings: The right common femoral artery was patent without disease. SFA was patent with 50% stenosis near the adductor inflow briskly to the popliteal. Below the knee the posterior tibial artery was the dominant runoff although was very sluggish consistent with outflow obstruction and this cut off above the ankle with no contrast filling the foot on angiography. On open examination the below-knee popliteal artery was patent. We were able to return mix of chronic and acute clot from the anterior tibial artery from the below-knee exposure. 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 Posterior tibial artery had mostly acute appearing clot from the below-knee popliteal exposure. We then exposed the posterior tibial artery at ankle and had strong antegrade bleeding but could not get any retrograde bleeding. Similarly the anterior tibial artery was exposed at the ankle and had strong antegrade bleeding but again no retrograde bleeding. TPA was administered in both arteries totaling 5 mg.  At completion we had  high resistance outflow signals at both the anterior tibial and posterior tibial arteries at the level of the ankle.   Procedure:  The patient was identified in the holding area and taken to the operating room where he was placed supine on the operating table and general anesthesia was induced. He was given antibiotics and sterilely prepped and draped in the bilateral lower extremities. After timeout was called again with ultrasound-guided evaluation of the right common femoral artery. This was cannulated with micropuncture needle and wire micropuncture sheath were placed. Then performed right lower extremity angiogram with the above findings. 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 We then removed the micropuncture sheath and pressure was held for a period of greater than 5 minutes. Turned our attention to the below-knee area where a longitudinal incision was made 1 similar posterior to the tibia. We dissected down and divided the fascia and exposed the below-knee popliteal vein. The anterior tibial vein was divided and the vein was retracted laterally. The popliteal artery was then identified and traced out. We opened the deep fascia to expose the origin of our peroneal and posterior tibial arteries. Patient was heparinized at this time after circulating for several minutes we placed a bulldog clamp on the popliteal artery. Anterior tibial artery was encircled with vessel loop. Made a transverse incision at the level of the anterior tibial artery. We did have some backbleeding which was very dark. We passed a Fogarty down the anterior tibial artery multiple times during acute chronic thrombus. We did have some minimal backbleeding at this time and no further thrombus was retrieved. We cannot get the Fogarty directed down the peroneal artery. We could get it down the posterior  tibial artery did remove again mostly acute thrombus we did have some backbleeding. Given that they have  backbleeding was sluggish elected to cut down at the level of the ankle. First identified posterior tibial artery on ultrasound and then made a longitudinal incision overlying it. We dissected down to the level of the neurovascular bundle and identified the artery dissected free from the veins. We made a transverse incision had brisk antegrade bleeding from the posterior tibial artery. It was then clamped with bulldog clamp. We passed Fogarty catheter multiple times only retrieving mostly chronic appearing thrombus could not get any backbleeding. We flushed with heparinized saline. Then turned attention to the anterior tibial artery which was also identified with ultrasound. Again longitudinally incision was made we dissected down and divided the muscle. We identified the neurovascular bundle dissected the veins off of the artery. The artery was clamped with bulldog proximally opened transversely. We do not have any antegrade bleeding we did pass a Fogarty proximally and then we did have extremely brisk bleeding. The artery was clamped again. Distally we really could not get the Fogarty catheter passed to 4 and only returned chronic appearing thrombus and intima. We then administered to have milligrams TPA in both the anterior tibial and posterior tibial arteries. We then closed in both with running 7-0 Prolene sutures. There were palpable pulses at the level of the ankle in both arteries but they signals were high resistance outflow. We then obtained hemostasis at all our wounds ankle incisions were closed with interrupted Vicryl followed by staples and similarly the below-knee popliteal incision was closed with running Vicryl suture followed by staples. Dry dressings were placed. Patient be maintained on heparin drip postoperatively and allow the foot to demarcate.  Contrast: 30cc  Blood loss: 100cc     Pasha Gadison C. Donzetta Matters, MD [facility] Office: [phone] Pager:  [phone]

## 2016-08-21 NOTE — Transfer of Care (Signed)
Immediate Anesthesia Transfer of Care Note  Patient: Edward Summers  Procedure(s) Performed: Procedure(s): LOWER EXTREMITY ANGIOGRAM (Right) Right Lower Extremity Thromboembolectomy with Injection of Alteplase into Right Anterior Tibial Artery and Right Anterior Peroneal Artery (Right)  Patient Location: PACU  Anesthesia Type:General  Level of Consciousness: awake  Airway & Oxygen Therapy: Patient Spontanous Breathing and Patient connected to nasal cannula oxygen  Post-op Assessment: Report given to RN and Post -op Vital signs reviewed and stable  Post vital signs: Reviewed and stable  Last Vitals:  Vitals:   08/21/16 1545 08/21/16 1615  BP: 126/79 114/74  Pulse: 77 78  Resp: 16 (!) 23  Temp:    SpO2: 97% 97%    Last Pain:  Vitals:   08/21/16 1415  TempSrc: Oral  PainSc:          Complications: No apparent anesthesia complications

## 2016-08-21 NOTE — ED Notes (Signed)
Pt family friend stated the pt is hurting. Pt family friend also stated the having in feet in a downwards potion is making his feet swell. I asked if she wanted to the wheel him closer to a chair to prop up his feet she (family friend) stated that moving his feet up makes it hurt worst.

## 2016-08-21 NOTE — ED Triage Notes (Signed)
Pt here from podiatrist office with bilateral leg and foot swelling with some discoloration

## 2016-08-21 NOTE — ED Notes (Signed)
EDP at bedside  

## 2016-08-21 NOTE — ED Notes (Signed)
Vascular surgeon at bedside to explain surgical interventions.  Consent signed and witnessed by this RN

## 2016-08-21 NOTE — Anesthesia Preprocedure Evaluation (Addendum)
Anesthesia Evaluation  Patient identified by MRN, date of birth, ID band Patient awake    Reviewed: Allergy & Precautions, H&P , NPO status , Patient's Chart, lab work & pertinent test results  Airway Mallampati: II  TM Distance: >3 FB Neck ROM: Full    Dental no notable dental hx. (+) Edentulous Upper, Edentulous Lower, Dental Advisory Given   Pulmonary neg pulmonary ROS, former smoker,    Pulmonary exam normal breath sounds clear to auscultation       Cardiovascular hypertension, negative cardio ROS   Rhythm:Regular Rate:Normal     Neuro/Psych negative neurological ROS  negative psych ROS   GI/Hepatic negative GI ROS, Neg liver ROS,   Endo/Other  negative endocrine ROS  Renal/GU negative Renal ROS  negative genitourinary   Musculoskeletal   Abdominal   Peds  Hematology negative hematology ROS (+)   Anesthesia Other Findings   Reproductive/Obstetrics negative OB ROS                            Anesthesia Physical Anesthesia Plan  ASA: II and emergent  Anesthesia Plan: General   Post-op Pain Management:    Induction: Intravenous  PONV Risk Score and Plan: 3 and Ondansetron, Dexamethasone and Midazolam  Airway Management Planned: Oral ETT  Additional Equipment:   Intra-op Plan:   Post-operative Plan: Extubation in OR  Informed Consent: I have reviewed the patients History and Physical, chart, labs and discussed the procedure including the risks, benefits and alternatives for the proposed anesthesia with the patient or authorized representative who has indicated his/her understanding and acceptance.   Dental advisory given  Plan Discussed with: CRNA  Anesthesia Plan Comments:        Anesthesia Quick Evaluation

## 2016-08-22 ENCOUNTER — Encounter (HOSPITAL_COMMUNITY): Payer: Self-pay | Admitting: *Deleted

## 2016-08-22 ENCOUNTER — Inpatient Hospital Stay (HOSPITAL_COMMUNITY): Payer: Medicare HMO

## 2016-08-22 DIAGNOSIS — I4891 Unspecified atrial fibrillation: Secondary | ICD-10-CM

## 2016-08-22 DIAGNOSIS — J449 Chronic obstructive pulmonary disease, unspecified: Secondary | ICD-10-CM | POA: Diagnosis present

## 2016-08-22 DIAGNOSIS — I10 Essential (primary) hypertension: Secondary | ICD-10-CM | POA: Diagnosis present

## 2016-08-22 DIAGNOSIS — E785 Hyperlipidemia, unspecified: Secondary | ICD-10-CM | POA: Diagnosis present

## 2016-08-22 DIAGNOSIS — I998 Other disorder of circulatory system: Secondary | ICD-10-CM

## 2016-08-22 LAB — ECHOCARDIOGRAM COMPLETE
AVLVOTPG: 4 mmHg
E/e' ratio: 8.26
FS: 28 % (ref 28–44)
Height: 67 in
IVS/LV PW RATIO, ED: 0.8
LA ID, A-P, ES: 33 mm
LA diam end sys: 33 mm
LA diam index: 1.81 cm/m2
LA vol A4C: 34.1 ml
LDCA: 3.46 cm2
LV E/e' medial: 8.26
LV TDI E'LATERAL: 8.16
LV e' LATERAL: 8.16 cm/s
LVEEAVG: 8.26
LVOT VTI: 19.4 cm
LVOTD: 21 mm
LVOTPV: 101 cm/s
LVOTSV: 67 mL
MV pk E vel: 67.4 m/s
MVPKAVEL: 78.1 m/s
PW: 10 mm — AB (ref 0.6–1.1)
RV LATERAL S' VELOCITY: 20.3 cm/s
TAPSE: 26.5 mm
TDI e' medial: 8.7
Weight: 2510.4 oz

## 2016-08-22 LAB — HEPARIN LEVEL (UNFRACTIONATED)
Heparin Unfractionated: <0.1 IU/mL — ABNORMAL LOW (ref 0.30–0.70)
Heparin Unfractionated: 0.23 IU/mL — ABNORMAL LOW (ref 0.30–0.70)

## 2016-08-22 LAB — LIPID PANEL
Cholesterol: 89 mg/dL (ref 0–200)
HDL: 21 mg/dL — ABNORMAL LOW (ref >40)
LDL CALC: 55 mg/dL (ref 0–99)
Total CHOL/HDL Ratio: 4.2 RATIO
Triglycerides: 66 mg/dL (ref <150)
VLDL: 13 mg/dL (ref 0–40)

## 2016-08-22 LAB — CORTISOL-AM, BLOOD: Cortisol - AM: 40 ug/dL — ABNORMAL HIGH (ref 6.7–22.6)

## 2016-08-22 LAB — BASIC METABOLIC PANEL
ANION GAP: 10 (ref 5–15)
ANION GAP: 7 (ref 5–15)
BUN: 16 mg/dL (ref 6–20)
BUN: 16 mg/dL (ref 6–20)
CALCIUM: 7.7 mg/dL — AB (ref 8.9–10.3)
CO2: 20 mmol/L — ABNORMAL LOW (ref 22–32)
CO2: 22 mmol/L (ref 22–32)
Calcium: 7.7 mg/dL — ABNORMAL LOW (ref 8.9–10.3)
Chloride: 106 mmol/L (ref 101–111)
Chloride: 105 mmol/L (ref 101–111)
Creatinine, Ser: 0.83 mg/dL (ref 0.61–1.24)
Creatinine, Ser: 0.77 mg/dL (ref 0.61–1.24)
GFR calc Af Amer: >60 mL/min (ref >60)
GLUCOSE: 190 mg/dL — AB (ref 65–99)
GLUCOSE: 171 mg/dL — AB (ref 65–99)
POTASSIUM: 4.4 mmol/L (ref 3.5–5.1)
POTASSIUM: 4 mmol/L (ref 3.5–5.1)
Sodium: 136 mmol/L (ref 135–145)
Sodium: 134 mmol/L — ABNORMAL LOW (ref 135–145)

## 2016-08-22 LAB — CBC
HCT: 33.2 % — ABNORMAL LOW (ref 39.0–52.0)
HEMATOCRIT: 35.5 % — AB (ref 39.0–52.0)
HEMOGLOBIN: 11.8 g/dL — AB (ref 13.0–17.0)
HEMOGLOBIN: 10.8 g/dL — AB (ref 13.0–17.0)
MCH: 28.5 pg (ref 26.0–34.0)
MCH: 27.8 pg (ref 26.0–34.0)
MCHC: 33.2 g/dL (ref 30.0–36.0)
MCHC: 32.5 g/dL (ref 30.0–36.0)
MCV: 85.7 fL (ref 78.0–100.0)
MCV: 85.6 fL (ref 78.0–100.0)
PLATELETS: 210 10*3/uL (ref 150–400)
PLATELETS: 163 10*3/uL (ref 150–400)
RBC: 4.14 MIL/uL — AB (ref 4.22–5.81)
RBC: 3.88 MIL/uL — ABNORMAL LOW (ref 4.22–5.81)
RDW: 16.1 % — ABNORMAL HIGH (ref 11.5–15.5)
RDW: 16.1 % — ABNORMAL HIGH (ref 11.5–15.5)
WBC: 34.1 10*3/uL — ABNORMAL HIGH (ref 4.0–10.5)
WBC: 26.3 10*3/uL — AB (ref 4.0–10.5)

## 2016-08-22 LAB — BRAIN NATRIURETIC PEPTIDE: B Natriuretic Peptide: 202.3 pg/mL — ABNORMAL HIGH (ref 0.0–100.0)

## 2016-08-22 LAB — MAGNESIUM: Magnesium: 2 mg/dL (ref 1.7–2.4)

## 2016-08-22 LAB — RAPID URINE DRUG SCREEN, HOSP PERFORMED
Amphetamines: NOT DETECTED
BENZODIAZEPINES: NOT DETECTED
Barbiturates: NOT DETECTED
COCAINE: NOT DETECTED
OPIATES: POSITIVE — AB
Tetrahydrocannabinol: NOT DETECTED

## 2016-08-22 LAB — TROPONIN I
TROPONIN I: 0.06 ng/mL — AB (ref <0.03)
Troponin I: <0.03 ng/mL (ref <0.03)

## 2016-08-22 LAB — HEMOGLOBIN A1C
HEMOGLOBIN A1C: 6.5 % — AB (ref 4.8–5.6)
MEAN PLASMA GLUCOSE: 139.85 mg/dL

## 2016-08-22 LAB — TSH: TSH: 0.49 u[IU]/mL (ref 0.350–4.500)

## 2016-08-22 LAB — T4, FREE: Free T4: 1.52 ng/dL — ABNORMAL HIGH (ref 0.61–1.12)

## 2016-08-22 MED ORDER — DILTIAZEM LOAD VIA INFUSION
10.0000 mg | Freq: Once | INTRAVENOUS | Status: DC
  Filled 2016-08-22: qty 10

## 2016-08-22 MED ORDER — METHYLPREDNISOLONE SODIUM SUCC 40 MG IJ SOLR
40.0000 mg | Freq: Two times a day (BID) | INTRAMUSCULAR | Status: DC
  Administered 2016-08-22 – 2016-08-23 (×2): 40 mg via INTRAVENOUS
  Filled 2016-08-22 (×2): qty 1

## 2016-08-22 MED ORDER — DEXTROSE 5 % IV SOLN
5.0000 mg/h | INTRAVENOUS | Status: DC
  Filled 2016-08-22: qty 100

## 2016-08-22 MED ORDER — BOOST / RESOURCE BREEZE PO LIQD
1.0000 | Freq: Three times a day (TID) | ORAL | Status: DC
  Administered 2016-08-22 – 2016-09-04 (×21): 1 via ORAL

## 2016-08-22 MED ORDER — MOMETASONE FURO-FORMOTEROL FUM 100-5 MCG/ACT IN AERO
2.0000 | INHALATION_SPRAY | Freq: Two times a day (BID) | RESPIRATORY_TRACT | Status: DC
  Administered 2016-08-25 – 2016-09-16 (×36): 2 via RESPIRATORY_TRACT
  Filled 2016-08-22 (×4): qty 8.8

## 2016-08-22 MED FILL — Alteplase For Inj 2 MG: INTRAMUSCULAR | Qty: 10 | Status: AC

## 2016-08-22 NOTE — Progress Notes (Signed)
ANTICOAGULATION CONSULT NOTE - Follow Up Consult  Pharmacy Consult for heparin dosing Indication: ischemic foot, post-op afib  Allergies  Allergen Reactions  . Zithromax [Azithromycin] Other (See Comments)    Side pain    Patient Measurements: Height: 5\' 7"  (170.2 cm) Weight: 156 lb 14.4 oz (71.2 kg) IBW/kg (Calculated) : 66.1 Heparin Dosing Weight: 71.2 kg  Vital Signs: Temp: 97.9 F (36.6 C) (08/17 0958) Temp Source: Oral (08/17 0958) BP: 120/77 (08/17 1329) Pulse Rate: 77 (08/17 1329)  Labs:  Recent Labs  08/21/16 1628 08/21/16 2141 08/22/16 0225 08/22/16 0607 08/22/16 1036 08/22/16 1432  HGB 13.7  --  11.8* 10.8*  --   --   HCT 40.0  --  35.5* 33.2*  --   --   PLT 176  --  210 163  --   --   LABPROT 14.7  --   --   --   --   --   INR 1.14  --   --   --   --   --   HEPARINUNFRC  --   --   --  <0.10*  --  0.23*  CREATININE 0.78  --  0.83 0.77  --   --   TROPONINI  --  0.03* 0.06*  --  <0.03  --     Estimated Creatinine Clearance: 75.7 mL/min (by C-G formula based on SCr of 0.77 mg/dL).   Medications:  Scheduled:  . atorvastatin  40 mg Oral q1800  . dextromethorphan-guaiFENesin  1 tablet Oral BID  . diltiazem  10 mg Intravenous Once  . docusate sodium  100 mg Oral Daily  . doxycycline  100 mg Oral Q12H  . feeding supplement  1 Container Oral TID BM  . ipratropium  0.5 mg Nebulization TID  . levalbuterol  1.25 mg Nebulization TID  . methylPREDNISolone (SOLU-MEDROL) injection  40 mg Intravenous Q12H  . mometasone-formoterol  2 puff Inhalation BID  . pantoprazole  40 mg Oral Daily   Infusions:  . sodium chloride    . sodium chloride 125 mL/hr at 08/21/16 2045  . amiodarone 30 mg/hr (08/22/16 1120)  . diltiazem (CARDIZEM) infusion    . heparin 1,000 Units/hr (08/22/16 0746)  . lactated ringers 10 mL/hr at 08/21/16 1709  . magnesium sulfate 1 - 4 g bolus IVPB      Assessment: Edward Summers is a 41 YOM who presented to the ED on 8/16 with RLE ischemic  foot. Pt went to the OR 8/16 PM for revascularization and developed post-op afib.  Of note, pt converted into NSR on amio gtt 8/17.    Heparin was initiated post-op.  Recent anti Xa level was subtherapeutic at 0.23 on heparin 1000 units/hr. Plan to adjust heparin dosing accordingly.  Goal of Therapy:  Heparin level 0.3-0.7 units/ml Monitor platelets by anticoagulation protocol: Yes   Plan:  Increase heparin infusion to 1200 units/hr Check anti-Xa level in 6 hours and daily while on heparin Continue to monitor H&H and platelets  Amy Dorszynski 08/22/2016,5:21 PM  [employer], Pharm.D., BCPS Clinical Pharmacist 08/22/2016 6:06 PM

## 2016-08-22 NOTE — Progress Notes (Addendum)
[facility]  Subjective  - Pain is better.   Objective 111/79 68 97.6 F (36.4 C) (Oral) 19 99%  Intake/Output Summary (Last 24 hours) at 08/22/16 0729 Last data filed at 08/22/16 0310  Gross per 24 hour  Intake          5335.79 ml  Output              915 ml  Net          4420.79 ml   Right DP doppler Medial and anterior ankle incisions with minimal bloody drainage Popliteal incision minimal bloody Active range of motion intact right foot Heart A fib rate 116 Lungs non labored breathing   Assessment/Planning: POD # 1  Procedure Performed: 1.  US guided cannulation of right common femoral artery 2.  Right lower extremity angiogram 3.  Thromboembolectomy of right popliteal, pt and AT arteries 4.  Intra-arterial administration of 5mg  tpa Arterial flow patent with decreased pain and active range of motion of the right foot. Dry dressing PRN to incisions  Laurence Slate [facility] 08/22/2016 7:29 AM --  Laboratory Lab Results:  Recent Labs  08/22/16 0225 08/22/16 0607  WBC 34.1* 26.3*  HGB 11.8* 10.8*  HCT 35.5* 33.2*  PLT 210 163   BMET  Recent Labs  08/22/16 0225 08/22/16 0607  NA 136 134*  K 4.4 4.0  CL 106 105  CO2 20* 22  GLUCOSE 190* 171*  BUN 16 16  CREATININE 0.83 0.77  CALCIUM 7.7* 7.7*    COAG Lab Results  Component Value Date   INR 1.14 08/21/2016   No results found for: PTT    I have interviewed patient with PA and agree with assessment and plan above. Appreciate consulting services. Will allow right foot to demarcate.   Brandon C. Donzetta Matters, MD [facility] [place] Office: [phone] Pager: [phone]

## 2016-08-22 NOTE — Evaluation (Signed)
Physical Therapy Evaluation Patient Details Name: Edward Summers MRN: [id number] DOB: [birth year] Today's Date: 08/22/2016   History of Present Illness  76 year old male with history of hypertension, hyperlipidemia, emphysema who was admitted by vascular surgery for acute right leg ischemia and underwent surgical thromboembolectomy of right popliteal, PT and AT arteries. Hospitalist was consulted because patient developed postprocedure atrial fibrillation with rapid ventricular rate and hypotension. Cardiology was also consulted for the same.    PMH:  HLD, HTN, COPD     Clinical Impression  Patient presents with problems listed below.  Will benefit from acute PT to maximize functional independence prior to discharge home with family.  Patient limited by pain today.  As pain improves, mobility should advance more quickly.  Recommend HHPT for continued therapy at d/c.     If patient unable to progress, may need to consider SNF at d/c.    Follow Up Recommendations Home health PT;Supervision for mobility/OOB    Equipment Recommendations  Rolling walker with 5" wheels    Recommendations for Other Services       Precautions / Restrictions Precautions Precautions: Fall Restrictions Weight Bearing Restrictions: No      Mobility  Bed Mobility Overal bed mobility: Needs Assistance Bed Mobility: Supine to Sit;Sit to Supine     Supine to sit: Min assist Sit to supine: Min assist   General bed mobility comments: Min assist to raise trunk to upright sitting position.  Good sitting balance once upright.  Assist to bring RLE onto bed to return to supine.  Transfers Overall transfer level: Needs assistance Equipment used: 4-wheeled walker Transfers: Sit to/from Stand Sit to Stand: Mod assist         General transfer comment: Verbal cues for hand placement.  Mod assist to power up to stance relying on UE's.  Patient unable to bear weight on RLE.  Attempted to take steps, and patient  with increased pain.  Asked to return to sitting due to pain.  Ambulation/Gait             General Gait Details: Unable  Financial trader Rankin (Stroke Patients Only)       Balance Overall balance assessment: Needs assistance Sitting-balance support: No upper extremity supported;Feet unsupported Sitting balance-Leahy Scale: Good     Standing balance support: Bilateral upper extremity supported Standing balance-Leahy Scale: Poor                               Pertinent Vitals/Pain Pain Assessment: Faces Faces Pain Scale: Hurts whole lot Pain Location: Rt LE with stance Pain Descriptors / Indicators: Aching;Grimacing;Sharp;Sore Pain Intervention(s): Limited activity within patient's tolerance;Monitored during session;Repositioned    Home Living Family/patient expects to be discharged to:: Private residence Living Arrangements: Spouse/significant other Available Help at Discharge: Family;Friend(s);Available 24 hours/day Type of Home: House Home Access: Stairs to enter Entrance Stairs-Rails: Right Entrance Stairs-Number of Steps: 3 Home Layout: One level Home Equipment: Cane - single point;Wheelchair - manual      Prior Function Level of Independence: Independent with assistive device(s)         Comments: Uses cane due to pain.  Takes sponge baths.     Hand Dominance        Extremity/Trunk Assessment   Upper Extremity Assessment Upper Extremity Assessment: Defer to OT evaluation    Lower Extremity Assessment Lower Extremity Assessment:  RLE deficits/detail;LLE deficits/detail RLE Deficits / Details: Post-op bandaging on incision sites.  Decreased strength and ROM due to pain.  Color changes to toes and plantar surface of forefoot. RLE: Unable to fully assess due to pain RLE Coordination: decreased gross motor LLE Deficits / Details: Significant edema especially in foot. LLE Sensation: decreased  light touch       Communication   Communication: No difficulties  Cognition Arousal/Alertness: Awake/alert Behavior During Therapy: WFL for tasks assessed/performed Overall Cognitive Status: Within Functional Limits for tasks assessed                                        General Comments      Exercises     Assessment/Plan    PT Assessment Patient needs continued PT services  PT Problem List Decreased strength;Decreased activity tolerance;Decreased balance;Decreased mobility;Decreased knowledge of use of DME;Cardiopulmonary status limiting activity;Pain;Decreased skin integrity       PT Treatment Interventions DME instruction;Gait training;Stair training;Functional mobility training;Therapeutic activities;Therapeutic exercise;Balance training;Patient/family education    PT Goals (Current goals can be found in the Care Plan section)  Acute Rehab PT Goals Patient Stated Goal: To decrease pain PT Goal Formulation: With patient Time For Goal Achievement: 08/29/16 Potential to Achieve Goals: Good    Frequency Min 3X/week   Barriers to discharge        Co-evaluation               AM-PAC PT "6 Clicks" Daily Activity  Outcome Measure Difficulty turning over in bed (including adjusting bedclothes, sheets and blankets)?: None Difficulty moving from lying on back to sitting on the side of the bed? : Unable Difficulty sitting down on and standing up from a chair with arms (e.g., wheelchair, bedside commode, etc,.)?: Unable Help needed moving to and from a bed to chair (including a wheelchair)?: A Lot Help needed walking in hospital room?: A Lot Help needed climbing 3-5 steps with a railing? : A Lot 6 Click Score: 12    End of Session Equipment Utilized During Treatment: Gait belt;Oxygen Activity Tolerance: Patient limited by pain Patient left: in bed;with call bell/phone within reach;with bed alarm set;with family/visitor present (With LE's  elevated) Nurse Communication: Mobility status PT Visit Diagnosis: Unsteadiness on feet (R26.81);Muscle weakness (generalized) (M62.81);Difficulty in walking, not elsewhere classified (R26.2);Pain Pain - Right/Left: Right Pain - part of body: Leg;Ankle and joints of foot    Time: 9480-1655 PT Time Calculation (min) (ACUTE ONLY): 29 min   Charges:   PT Evaluation $PT Eval Moderate Complexity: 1 Mod PT Treatments $Therapeutic Activity: 8-22 mins   PT G Codes:        Carita Pian. Sanjuana Kava, [facility] Acute Rehab Services Pager [phone]   Despina Pole 08/22/2016, 10:08 PM

## 2016-08-22 NOTE — Progress Notes (Signed)
Called PA Tiffany Green to make aware HR continues to range 120-150's. MD aware ane will see patient. Pt resting with call bell within reach.  Will continue to monitor. Payton Emerald, RN

## 2016-08-22 NOTE — Progress Notes (Signed)
Patient ID: Edward Summers, male   DOB: November 17, 1940, 76 y.o.   MRN: [id number]  PROGRESS NOTE    Edward Summers  ZHY:[id number] DOB: 1940-12-21 DOA: 08/21/2016 PCP: Edward Panda, MD   Brief Narrative:  76 year old male with history of hypertension, hyperlipidemia, emphysema who was admitted by vascular surgery for acute right leg ischemia and underwent surgical thromboembolectomy of right popliteal, PT and AT arteries. Hospitalist was consulted because patient developed postprocedure atrial fibrillation with rapid ventricular rate and hypotension. Cardiology was also consulted for the same.  Assessment & Plan:   Principal Problem:   Ischemic pain of foot, right Active Problems:   New onset atrial fibrillation (HCC)   Atrial fibrillation with RVR (HCC)   HLD (hyperlipidemia)   Essential hypertension   COPD with acute exacerbation (HCC)  COPD exacerbation:  - Currently not much wheezing.  - Decrease Medrol to 40 mg IV every 12 hours.  - Add Dulera. Continue nebs - Counseled about tobacco cessation. Patient might need outpatient pulmonary evaluation - Incentive spirometry and repeat chest x-ray for tomorrow; if no signs of pneumonia, we will discontinue doxycycline  New onset atrial fibrillation with RVR: - Currently in amiodarone drip. Cardiology following.   Right lower extremity acute ischemia:  status post surgical thromboembolectomy of R popliteal,PT and AT arteries. On heparin drip. Management as per primary vascular surgery team  HTN: Bp is soft. -Hold home Bp meds  HLD:  -Lipitor  Leukocytosis - Probably reactive. Repeat a.m. labs   Subjective: Patient seen and examined at bedside. He denies any overnight fever, nausea or vomiting. He complains of some shortness of breath and cough.  Objective: Vitals:   08/22/16 0900 08/22/16 0958 08/22/16 1100 08/22/16 1137  BP:  116/64    Pulse: (!) 117 (!) 128 73   Resp: (!) 23 16 (!) 27 20  Temp:  97.9 F (36.6 C)      TempSrc:  Oral    SpO2: 100% 100% 99% 98%  Weight:      Height:        Intake/Output Summary (Last 24 hours) at 08/22/16 1201 Last data filed at 08/22/16 0830  Gross per 24 hour  Intake          5335.79 ml  Output             1565 ml  Net          3770.79 ml   Filed Weights   08/21/16 1716 08/21/16 2258  Weight: 62.6 kg (138 lb) 71.2 kg (156 lb 14.4 oz)    Examination:  General exam: Appears calm and comfortable  Respiratory system: Bilateral decreased breath sound at bases With scattered crackles no wheezing Cardiovascular system: S1 & S2 heard, tachycardic  Gastrointestinal system: Abdomen is nondistended, soft and nontender. Normal bowel sounds heard. Extremities: No cyanosis, clubbing; 1-2+ pitting edema   Data Reviewed: I have personally reviewed following labs and imaging studies  CBC:  Recent Labs Lab 08/21/16 1628 08/22/16 0225 08/22/16 0607  WBC 27.6* 34.1* 26.3*  NEUTROABS 22.1*  --   --   HGB 13.7 11.8* 10.8*  HCT 40.0 35.5* 33.2*  MCV 84.2 85.7 85.6  PLT 176 210 295   Basic Metabolic Panel:  Recent Labs Lab 08/21/16 1628 08/22/16 0225 08/22/16 0607  NA 134* 136 134*  K 4.3 4.4 4.0  CL 101 106 105  CO2 24 20* 22  GLUCOSE 113* 190* 171*  BUN 12 16 16   CREATININE 0.78 0.83 0.77  CALCIUM 8.2* 7.7* 7.7*  MG  --   --  2.0   GFR: Estimated Creatinine Clearance: 75.7 mL/min (by C-G formula based on SCr of 0.77 mg/dL). Liver Function Tests:  Recent Labs Lab 08/21/16 1628  AST 22  ALT 31  ALKPHOS 85  BILITOT 1.2  PROT 5.5*  ALBUMIN 2.2*   No results for input(s): LIPASE, AMYLASE in the last 168 hours. No results for input(s): AMMONIA in the last 168 hours. Coagulation Profile:  Recent Labs Lab 08/21/16 1628  INR 1.14   Cardiac Enzymes:  Recent Labs Lab 08/21/16 2141 08/22/16 0225  TROPONINI 0.03* 0.06*   BNP (last 3 results) No results for input(s): PROBNP in the last 8760 hours. HbA1C:  Recent Labs  08/22/16 0225   HGBA1C 6.5*   CBG: No results for input(s): GLUCAP in the last 168 hours. Lipid Profile:  Recent Labs  08/22/16 0225  CHOL 89  HDL 21*  LDLCALC 55  TRIG 66  CHOLHDL 4.2   Thyroid Function Tests:  Recent Labs  08/22/16 0225  TSH 0.490  FREET4 1.52*   Anemia Panel: No results for input(s): VITAMINB12, FOLATE, FERRITIN, TIBC, IRON, RETICCTPCT in the last 72 hours. Sepsis Labs: No results for input(s): PROCALCITON, LATICACIDVEN in the last 168 hours.  No results found for this or any previous visit (from the past 240 hour(s)).       Radiology Studies: Dg Chest Portable 1 View  Result Date: 08/21/2016 CLINICAL DATA:  Shortness of breath, bilateral lower extremity edema and discoloration. EXAM: PORTABLE CHEST 1 VIEW COMPARISON:  PA and lateral chest x-ray of July 04, 2016 FINDINGS: The left lung is well-expanded and clear. On the right there is increased density at the lung base. The heart is mildly enlarged. The pulmonary vascularity is normal. There is no pleural effusion. There is calcification in the wall of the aortic arch. The bony thorax exhibits no acute abnormality. IMPRESSION: Right lower lobe atelectasis or pneumonia. Probable underlying COPD or reactive airway disease. Followup PA and lateral chest X-ray is recommended in 3-4 weeks following trial of antibiotic therapy to ensure resolution and exclude underlying malignancy. No evidence of pulmonary edema or pleural effusions. Electronically Signed   By: Edward  Summers M.D.   On: 08/21/2016 15:33   Dg Foot 2 Views Left  Result Date: 08/21/2016 Please see detailed radiograph report in office note.  Dg Foot 2 Views Right  Result Date: 08/21/2016 Please see detailed radiograph report in office note.       Scheduled Meds: . atorvastatin  40 mg Oral q1800  . dextromethorphan-guaiFENesin  1 tablet Oral BID  . docusate sodium  100 mg Oral Daily  . doxycycline  100 mg Oral Q12H  . ipratropium  0.5 mg  Nebulization TID  . levalbuterol  1.25 mg Nebulization TID  . methylPREDNISolone (SOLU-MEDROL) injection  40 mg Intravenous Q12H  . mometasone-formoterol  2 puff Inhalation BID  . morphine   Intravenous Q4H  . pantoprazole  40 mg Oral Daily   Continuous Infusions: . sodium chloride    . sodium chloride 125 mL/hr at 08/21/16 2045  . amiodarone 30 mg/hr (08/22/16 1120)  . cefUROXime (ZINACEF)  IV 1.5 g (08/22/16 1132)  . heparin 1,000 Units/hr (08/22/16 0746)  . lactated ringers 10 mL/hr at 08/21/16 1709  . magnesium sulfate 1 - 4 g bolus IVPB       LOS: 1 day        Aline August, MD [facility] Pager [phone]  If 7PM-7AM, please contact night-coverage [email or web address] Password [email or web address] 08/22/2016, 12:01 PM

## 2016-08-22 NOTE — Progress Notes (Signed)
Spoke with patient concerning plan of care and while in room patient converted to normal sinus rhythm 70's.  Paged PA and MD to make aware. Orders for cardiazem on hold until speak with MD. Pt resting with call bell within reach.  Will continue to monitor. Payton Emerald, RN

## 2016-08-22 NOTE — Progress Notes (Signed)
[place] for Heparin Indication: Ischemic Foot  Allergies  Allergen Reactions  . Zithromax [Azithromycin] Other (See Comments)    Side pain    Patient Measurements: Height: 5\' 7"  (170.2 cm) Weight: 156 lb 14.4 oz (71.2 kg) IBW/kg (Calculated) : 66.1   Vital Signs: Temp: 97.6 F (36.4 C) (08/17 0532) Temp Source: Oral (08/17 0532) BP: 111/79 (08/17 0532) Pulse Rate: 68 (08/17 0532)  Labs:  Recent Labs  08/21/16 1628 08/21/16 2141 08/22/16 0225 08/22/16 0607  HGB 13.7  --  11.8* 10.8*  HCT 40.0  --  35.5* 33.2*  PLT 176  --  210 163  LABPROT 14.7  --   --   --   INR 1.14  --   --   --   HEPARINUNFRC  --   --   --  <0.10*  CREATININE 0.78  --  0.83 0.77  TROPONINI  --  0.03* 0.06*  --     Estimated Creatinine Clearance: 75.7 mL/min (by C-G formula based on SCr of 0.77 mg/dL).  Assessment: 76 YO Male with RLE ischemia s/p thrombectomy for heparin  Goal of Therapy:  Heparin level 0.3-0.7 units/ml  Monitor platelets by anticoagulation protocol: Yes   Plan:  Increase Heparin 1000 units/hr Check heparin level in 8 hours.   Phillis Knack, PharmD, BCPS

## 2016-08-22 NOTE — Progress Notes (Addendum)
Initial Nutrition Assessment  DOCUMENTATION CODES:   Not applicable  INTERVENTION:    Boost Breeze po TID, each supplement provides 250 kcal and 9 grams of protein  NUTRITION DIAGNOSIS:   Increased nutrient needs related to acute illness as evidenced by estimated needs  GOAL:   Patient will meet greater than or equal to 90% of their needs  MONITOR:   PO intake, Supplement acceptance, Labs, Weight trends, Skin, I & O's  REASON FOR ASSESSMENT:   Malnutrition Screening Tool  ASSESSMENT:   76 yo Male with history of hypertension, hyperlipidemia, emphysema who was admitted by vascular surgery for acute right leg ischemia and underwent surgical thromboembolectomy of right popliteal, PT and AT arteries. Hospitalist was consulted because patient developed postprocedure atrial fibrillation with rapid ventricular rate and hypotension. Cardiology was also consulted for the same.  Pt admitted with acute R lower extremity ischemia. Reports his appetite is "on and off". Would graze on food PTA. Does not like Boost and/or Ensure nutrition supplements.  Amenable to trying Boost Breeze supplement during hospitalization.  Also endorses a 10 lb weight loss. Time frame unknown. Labs and medications reviewed. Na 134 (L).  Nutrition focused physical exam completed.  No muscle or subcutaneous fat depletion noticed.  Diet Order:  Diet Heart Room service appropriate? Yes; Fluid consistency: Thin  Skin:  Reviewed, no issues  Last BM:  8/15  Height:   Ht Readings from Last 1 Encounters:  08/21/16 5\' 7"  (1.702 m)   Weight:   Wt Readings from Last 1 Encounters:  08/21/16 156 lb 14.4 oz (71.2 kg)   Ideal Body Weight:  67.2 kg  BMI:  Body mass index is 24.57 kg/m.  Estimated Nutritional Needs:   Kcal:  1800-2000  Protein:  90-110 gm  Fluid:  1.8-2.0 L  EDUCATION NEEDS:   No education needs identified at this time  Arthur Holms, RD, LDN Pager #: [phone] After-Hours  Pager #: [phone]

## 2016-08-22 NOTE — Progress Notes (Signed)
Progress Note  Patient Name: Edward Summers Date of Encounter: 08/22/2016  Primary Cardiologist: New  Subjective   This patient was seen by the Fellow overnight for post op Afib with RVR.   He is NPO, he is overall feeling well. No CP or SOB.   Inpatient Medications    Scheduled Meds: . atorvastatin  40 mg Oral q1800  . dextromethorphan-guaiFENesin  1 tablet Oral BID  . docusate sodium  100 mg Oral Daily  . doxycycline  100 mg Oral Q12H  . ipratropium  0.5 mg Nebulization TID  . levalbuterol  1.25 mg Nebulization TID  . methylPREDNISolone (SOLU-MEDROL) injection  60 mg Intravenous Q8H  . metoprolol tartrate      . morphine   Intravenous Q4H  . pantoprazole  40 mg Oral Daily   Continuous Infusions: . sodium chloride    . sodium chloride 125 mL/hr at 08/21/16 2045  . amiodarone 30 mg/hr (08/22/16 0440)  . cefUROXime (ZINACEF)  IV Stopped (08/21/16 2351)  . heparin 1,000 Units/hr (08/22/16 0746)  . lactated ringers 10 mL/hr at 08/21/16 1709  . magnesium sulfate 1 - 4 g bolus IVPB     PRN Meds: sodium chloride, acetaminophen **OR** acetaminophen, alum & mag hydroxide-simeth, bisacodyl, diphenhydrAMINE **OR** diphenhydrAMINE, guaiFENesin-dextromethorphan, hydrALAZINE, levalbuterol, magnesium sulfate 1 - 4 g bolus IVPB, metoprolol tartrate, naloxone **AND** sodium chloride flush, ondansetron, ondansetron (ZOFRAN) IV, oxyCODONE-acetaminophen, phenol, potassium chloride, senna-docusate   Vital Signs    Vitals:   08/22/16 0532 08/22/16 0700 08/22/16 0800 08/22/16 0811  BP: 111/79   103/83  Pulse: 68 90  (!) 130  Resp: 19 15 18 18   Temp: 97.6 F (36.4 C)   97.9 F (36.6 C)  TempSrc: Oral   Oral  SpO2: 99% 99% 99% 100%  Weight:      Height:        Intake/Output Summary (Last 24 hours) at 08/22/16 0826 Last data filed at 08/22/16 0310  Gross per 24 hour  Intake          5335.79 ml  Output              915 ml  Net          4420.79 ml   Filed Weights   08/21/16  1716 08/21/16 2258  Weight: 138 lb (62.6 kg) 156 lb 14.4 oz (71.2 kg)    Telemetry    Atrial fibrillation with rates previously 150- 160s, occasional PVC or dropped beat. Current rates are 120s - Personally Reviewed   Physical Exam   GEN: Well nourished, well developed  HEENT: normal  Neck: no JVD, carotid bruits, or masses Cardiac: Irregular rhythm,  no murmurs, rubs, or gallops,no edema. Intact distal pulses bilaterally.  Respiratory: clear to auscultation bilaterally, normal work of breathing GI: soft, nontender, nondistended, + BS MS: no deformity or atrophy. + right post op dressings Skin: warm and dry, no rash Neuro: Alert and Oriented x 3, Strength and sensation are intact Psych:   Full affect  Labs    Chemistry Recent Labs Lab 08/21/16 1628 08/22/16 0225 08/22/16 0607  NA 134* 136 134*  K 4.3 4.4 4.0  CL 101 106 105  CO2 24 20* 22  GLUCOSE 113* 190* 171*  BUN 12 16 16   CREATININE 0.78 0.83 0.77  CALCIUM 8.2* 7.7* 7.7*  PROT 5.5*  --   --   ALBUMIN 2.2*  --   --   AST 22  --   --   ALT  31  --   --   ALKPHOS 85  --   --   BILITOT 1.2  --   --   GFRNONAA >60 >60 >60  GFRAA >60 >60 >60  ANIONGAP 9 10 7      Hematology Recent Labs Lab 08/21/16 1628 08/22/16 0225 08/22/16 0607  WBC 27.6* 34.1* 26.3*  RBC 4.75 4.14* 3.88*  HGB 13.7 11.8* 10.8*  HCT 40.0 35.5* 33.2*  MCV 84.2 85.7 85.6  MCH 28.8 28.5 27.8  MCHC 34.3 33.2 32.5  RDW 15.9* 16.1* 16.1*  PLT 176 210 163    Cardiac Enzymes Recent Labs Lab 08/21/16 2141 08/22/16 0225  TROPONINI 0.03* 0.06*   No results for input(s): TROPIPOC in the last 168 hours.   BNP Recent Labs Lab 08/21/16 1628 08/22/16 0225  BNP 53.8 202.3*     DDimer No results for input(s): DDIMER in the last 168 hours.   Radiology    Dg Chest Portable 1 View  Result Date: 08/21/2016 CLINICAL DATA:  Shortness of breath, bilateral lower extremity edema and discoloration. EXAM: PORTABLE CHEST 1 VIEW COMPARISON:   PA and lateral chest x-ray of July 04, 2016 FINDINGS: The left lung is well-expanded and clear. On the right there is increased density at the lung base. The heart is mildly enlarged. The pulmonary vascularity is normal. There is no pleural effusion. There is calcification in the wall of the aortic arch. The bony thorax exhibits no acute abnormality. IMPRESSION: Right lower lobe atelectasis or pneumonia. Probable underlying COPD or reactive airway disease. Followup PA and lateral chest X-ray is recommended in 3-4 weeks following trial of antibiotic therapy to ensure resolution and exclude underlying malignancy. No evidence of pulmonary edema or pleural effusions. Electronically Signed   By: David  Martinique M.D.   On: 08/21/2016 15:33   Dg Foot 2 Views Left  Result Date: 08/21/2016 Please see detailed radiograph report in office note.  Dg Foot 2 Views Right  Result Date: 08/21/2016 Please see detailed radiograph report in office note.   Cardiac Studies   Echocardiogram pending  Patient Profile    Edward Summers is a 76 y.o. male who is currently admitted to the vascular surgery service for acute limb ischemia, now POD 0 after surgical thromboembolectomy of R popliteal PT, and AT arteries.  Cardiology is consulted for management of atrial fibrillation with RVR, which was noted in the PACU post operatively.  Per the patient, he has never had any notable history of cardiovascular issues.  In the PACU, pt was in AF with rates in the 150s-170s.  His SBP after 2.5 mg IV metoprolol was in the 90s, so IV amiodarone bolus and infusion were started, and he received 1 L IVF.  Pt is being anticoagulated with heparin post procedure.  Upon my exam, pt's heart rates were in the 150s, and BP was 118/84.  He denied any CP, palpitations or lightheadedness.  Pt did report very mild SOB.  Cardiovascular ROS was only notable for LE swelling in the previous 3-4 weeks (L > R).  Assessment & Plan    1.Atrial fibrillation  with RVR: The patient was admitted for operative intervention of acute limb ischemia, this may have been precipitated by his atrial fibrillation, pt denies having symptoms of CP, palpitations or dizziness. He has had some very mild SOB. HE does remember a few months ago having coughing spells. Denies having a hx of atrial fibrillation that he is aware of. A transthoracic echocardiogram is pending to evaluate  his heart.  His systolic BP dropped to the 90s last night with pushes of IV Metoprolol  so this was discontinued. He is currently on IV Amiodarone and IV Heparin and his rates have improved but are still elevated (120s). His blood pressures are still borderline low so I would not add back Metoprolol at this time and probably cannot use Dilt either. Will discuss choice of therapy with attending may need to try Digoxin or TEE cardioversion? -- If his AF persists and the patient does not convert, we may consider TEE-DCCV in 1-2 days.   CHA2DS2-VASc Score  = at least 2 ( HTN and AGE )  [1 point for hx CHF, HTN, DM, Vascular Dze, Age if 54-74, or Male] [2 points for Age > 65, or Stroke/TIA/TE]        Signed, Linus Mako, PA-C  08/22/2016, 8:26 AM

## 2016-08-22 NOTE — Progress Notes (Signed)
  Echocardiogram 2D Echocardiogram has been performed.  Johny Chess 08/22/2016, 3:35 PM

## 2016-08-23 ENCOUNTER — Inpatient Hospital Stay (HOSPITAL_COMMUNITY): Payer: Medicare HMO

## 2016-08-23 DIAGNOSIS — I999 Unspecified disorder of circulatory system: Secondary | ICD-10-CM

## 2016-08-23 DIAGNOSIS — M79671 Pain in right foot: Secondary | ICD-10-CM

## 2016-08-23 LAB — URINALYSIS, COMPLETE (UACMP) WITH MICROSCOPIC
Bacteria, UA: NONE SEEN
Bilirubin Urine: NEGATIVE
GLUCOSE, UA: NEGATIVE mg/dL
Ketones, ur: NEGATIVE mg/dL
Leukocytes, UA: NEGATIVE
NITRITE: NEGATIVE
PH: 5 (ref 5.0–8.0)
Protein, ur: NEGATIVE mg/dL
Specific Gravity, Urine: 1.023 (ref 1.005–1.030)

## 2016-08-23 LAB — HEPARIN LEVEL (UNFRACTIONATED)
HEPARIN UNFRACTIONATED: 0.52 [IU]/mL (ref 0.30–0.70)
Heparin Unfractionated: 0.43 IU/mL (ref 0.30–0.70)
Heparin Unfractionated: 0.57 IU/mL (ref 0.30–0.70)

## 2016-08-23 LAB — BASIC METABOLIC PANEL
Anion gap: 7 (ref 5–15)
BUN: 16 mg/dL (ref 6–20)
CHLORIDE: 103 mmol/L (ref 101–111)
CO2: 24 mmol/L (ref 22–32)
Calcium: 7.7 mg/dL — ABNORMAL LOW (ref 8.9–10.3)
Creatinine, Ser: 0.73 mg/dL (ref 0.61–1.24)
GFR calc non Af Amer: >60 mL/min (ref >60)
Glucose, Bld: 186 mg/dL — ABNORMAL HIGH (ref 65–99)
POTASSIUM: 3.9 mmol/L (ref 3.5–5.1)
SODIUM: 134 mmol/L — AB (ref 135–145)

## 2016-08-23 LAB — CBC WITH DIFFERENTIAL/PLATELET
BASOS ABS: 0 10*3/uL (ref 0.0–0.1)
BASOS PCT: 0 %
EOS PCT: 0 %
Eosinophils Absolute: 0 10*3/uL (ref 0.0–0.7)
HEMATOCRIT: 28.8 % — AB (ref 39.0–52.0)
HEMOGLOBIN: 9.5 g/dL — AB (ref 13.0–17.0)
LYMPHS PCT: 7 %
Lymphs Abs: 2 10*3/uL (ref 0.7–4.0)
MCH: 27.9 pg (ref 26.0–34.0)
MCHC: 33 g/dL (ref 30.0–36.0)
MCV: 84.7 fL (ref 78.0–100.0)
MONOS PCT: 4 %
Monocytes Absolute: 1.1 10*3/uL — ABNORMAL HIGH (ref 0.1–1.0)
NEUTROS ABS: 25.2 10*3/uL — AB (ref 1.7–7.7)
NEUTROS PCT: 89 %
Platelets: 171 10*3/uL (ref 150–400)
RBC: 3.4 MIL/uL — ABNORMAL LOW (ref 4.22–5.81)
RDW: 15.9 % — ABNORMAL HIGH (ref 11.5–15.5)
WBC: 28.3 10*3/uL — ABNORMAL HIGH (ref 4.0–10.5)

## 2016-08-23 LAB — T3, FREE: T3 FREE: 2.6 pg/mL (ref 2.0–4.4)

## 2016-08-23 LAB — MAGNESIUM: MAGNESIUM: 1.9 mg/dL (ref 1.7–2.4)

## 2016-08-23 MED ORDER — AMIODARONE HCL 200 MG PO TABS
200.0000 mg | ORAL_TABLET | Freq: Two times a day (BID) | ORAL | Status: DC
  Administered 2016-08-23 – 2016-08-31 (×17): 200 mg via ORAL
  Filled 2016-08-23 (×17): qty 1

## 2016-08-23 MED ORDER — LEVOFLOXACIN 750 MG PO TABS
750.0000 mg | ORAL_TABLET | Freq: Every day | ORAL | Status: AC
  Administered 2016-08-24 – 2016-08-27 (×4): 750 mg via ORAL
  Filled 2016-08-23 (×5): qty 1

## 2016-08-23 MED ORDER — APIXABAN 5 MG PO TABS
5.0000 mg | ORAL_TABLET | Freq: Two times a day (BID) | ORAL | Status: DC
  Administered 2016-08-23 – 2016-08-24 (×3): 5 mg via ORAL
  Filled 2016-08-23 (×3): qty 1

## 2016-08-23 MED ORDER — PREDNISONE 20 MG PO TABS
40.0000 mg | ORAL_TABLET | Freq: Every day | ORAL | Status: DC
  Administered 2016-08-24: 40 mg via ORAL
  Filled 2016-08-23: qty 2

## 2016-08-23 NOTE — Progress Notes (Signed)
Patient ID: Edward Summers, male   DOB: 1940/07/16, 76 y.o.   MRN: [id number]  PROGRESS NOTE    JOVANTE HAMMITT  CVE:[id number] DOB: 1940-12-17 DOA: 08/21/2016 PCP: Jilda Panda, MD   Brief Narrative:  76 year old male with history of hypertension, hyperlipidemia, emphysema who was admitted by vascular surgery for acute right leg ischemia and underwent surgical thromboembolectomy of right popliteal, PT and AT arteries. Hospitalist was consulted because patient developed postprocedure atrial fibrillation with rapid ventricular rate and hypotension. Cardiology was also consulted for the same.  Assessment & Plan:   Principal Problem:   Ischemic pain of foot, right Active Problems:   New onset atrial fibrillation (HCC)   Atrial fibrillation with RVR (HCC)   HLD (hyperlipidemia)   Essential hypertension   COPD with acute exacerbation (HCC)   Ischemic foot   COPD exacerbation:  - Currently not much wheezing.  -Switch Solu-Medrol to 40 mg prednisone daily and taper in the next few days - Continue Dulera. Continue nebs - Counseled about tobacco cessation. Patient might need outpatient pulmonary evaluation - Incentive spirometry  - Repeat chest x-ray shows persistent right lung opacity which might be atelectasis versus infiltrate. Change doxycycline to oral Levaquin  Paroxysmal atrial fibrillation with rapid ventricular rate - Currently in sinus rhythm. Cardiology is following   Right lower extremity acute ischemia: status post surgical thromboembolectomy of R popliteal,PT and AT arteries. On Eliquis.  Management as per primary vascular surgery team  HTN: Bp stable -Monitor blood pressure  HLD:  -Lipitor  Leukocytosis - Probably reactive versus secondary to pneumonia. Repeat a.m. labs     Subjective: Patient seen and examined at bedside. He denies any overnight fever, nausea or vomiting. He is still coughing intermittently  Objective: Vitals:   08/23/16 0400  08/23/16 0651 08/23/16 0735 08/23/16 0849  BP: (!) 124/56  127/72   Pulse: 63  65   Resp: 17  16   Temp:  98.4 F (36.9 C)  (!) 97.5 F (36.4 C)  TempSrc:  Oral  Oral  SpO2: 97%  98%   Weight:      Height:        Intake/Output Summary (Last 24 hours) at 08/23/16 1336 Last data filed at 08/23/16 0850  Gross per 24 hour  Intake              480 ml  Output              500 ml  Net              -20 ml   Filed Weights   08/21/16 1716 08/21/16 2258  Weight: 62.6 kg (138 lb) 71.2 kg (156 lb 14.4 oz)    Examination:  General exam: Appears calm and comfortable  Respiratory system: Bilateral decreased breath sound at bases With scattered crackles but no wheezing Cardiovascular system: S1 & S2 heard, rate controlled  Gastrointestinal system: Abdomen is nondistended, soft and nontender. Normal bowel sounds heard. Extremities: No cyanosis, clubbing; 1-2+ pitting edema     Data Reviewed: I have personally reviewed following labs and imaging studies  CBC:  Recent Labs Lab 08/21/16 1628 08/22/16 0225 08/22/16 0607 08/23/16 0120  WBC 27.6* 34.1* 26.3* 28.3*  NEUTROABS 22.1*  --   --  25.2*  HGB 13.7 11.8* 10.8* 9.5*  HCT 40.0 35.5* 33.2* 28.8*  MCV 84.2 85.7 85.6 84.7  PLT 176 210 163 025   Basic Metabolic Panel:  Recent Labs Lab 08/21/16 1628 08/22/16 0225 08/22/16 8527  08/23/16 0120  NA 134* 136 134* 134*  K 4.3 4.4 4.0 3.9  CL 101 106 105 103  CO2 24 20* 22 24  GLUCOSE 113* 190* 171* 186*  BUN 12 16 16 16   CREATININE 0.78 0.83 0.77 0.73  CALCIUM 8.2* 7.7* 7.7* 7.7*  MG  --   --  2.0 1.9   GFR: Estimated Creatinine Clearance: 75.7 mL/min (by C-G formula based on SCr of 0.73 mg/dL). Liver Function Tests:  Recent Labs Lab 08/21/16 1628  AST 22  ALT 31  ALKPHOS 85  BILITOT 1.2  PROT 5.5*  ALBUMIN 2.2*   No results for input(s): LIPASE, AMYLASE in the last 168 hours. No results for input(s): AMMONIA in the last 168 hours. Coagulation  Profile:  Recent Labs Lab 08/21/16 1628  INR 1.14   Cardiac Enzymes:  Recent Labs Lab 08/21/16 2141 08/22/16 0225 08/22/16 1036  TROPONINI 0.03* 0.06* <0.03   BNP (last 3 results) No results for input(s): PROBNP in the last 8760 hours. HbA1C:  Recent Labs  08/22/16 0225  HGBA1C 6.5*   CBG: No results for input(s): GLUCAP in the last 168 hours. Lipid Profile:  Recent Labs  08/22/16 0225  CHOL 89  HDL 21*  LDLCALC 55  TRIG 66  CHOLHDL 4.2   Thyroid Function Tests:  Recent Labs  08/22/16 0225  TSH 0.490  FREET4 1.52*  T3FREE 2.6   Anemia Panel: No results for input(s): VITAMINB12, FOLATE, FERRITIN, TIBC, IRON, RETICCTPCT in the last 72 hours. Sepsis Labs: No results for input(s): PROCALCITON, LATICACIDVEN in the last 168 hours.  No results found for this or any previous visit (from the past 240 hour(s)).       Radiology Studies: Dg Chest 2 View  Result Date: 08/23/2016 CLINICAL DATA:  Dyspnea. Hx of smoking. Emphysema of lung, HTN, PNA and diabetes. Pt came into hospital for LLE swelling and numbness. EXAM: CHEST  2 VIEW COMPARISON:  08/21/2016 FINDINGS: Cardiac silhouette is mildly enlarged. No mediastinal or hilar masses. Small, right greater than left, pleural effusions with associated lung base opacity, most likely atelectasis. Right lower lobe pneumonia should be considered likely if there are consistent clinical symptoms. Pleural effusions have increased when compared the prior exam. Remainder of the lungs is clear. No pneumothorax. IMPRESSION: 1. Mild increase in lung base opacity which is likely due to an increase in pleural effusions, right greater than left. 2. Persistent right lung base opacity which may reflect atelectasis, pneumonia or a combination. 3. No evidence of pulmonary edema. Electronically Signed   By: Lajean Manes M.D.   On: 08/23/2016 08:30   Dg Chest Portable 1 View  Result Date: 08/21/2016 CLINICAL DATA:  Shortness of breath,  bilateral lower extremity edema and discoloration. EXAM: PORTABLE CHEST 1 VIEW COMPARISON:  PA and lateral chest x-ray of July 04, 2016 FINDINGS: The left lung is well-expanded and clear. On the right there is increased density at the lung base. The heart is mildly enlarged. The pulmonary vascularity is normal. There is no pleural effusion. There is calcification in the wall of the aortic arch. The bony thorax exhibits no acute abnormality. IMPRESSION: Right lower lobe atelectasis or pneumonia. Probable underlying COPD or reactive airway disease. Followup PA and lateral chest X-ray is recommended in 3-4 weeks following trial of antibiotic therapy to ensure resolution and exclude underlying malignancy. No evidence of pulmonary edema or pleural effusions. Electronically Signed   By: David  Martinique M.D.   On: 08/21/2016 15:33  Scheduled Meds: . amiodarone  200 mg Oral BID  . apixaban  5 mg Oral BID  . atorvastatin  40 mg Oral q1800  . dextromethorphan-guaiFENesin  1 tablet Oral BID  . diltiazem  10 mg Intravenous Once  . docusate sodium  100 mg Oral Daily  . doxycycline  100 mg Oral Q12H  . feeding supplement  1 Container Oral TID BM  . ipratropium  0.5 mg Nebulization TID  . levalbuterol  1.25 mg Nebulization TID  . mometasone-formoterol  2 puff Inhalation BID  . pantoprazole  40 mg Oral Daily  . [START ON 08/24/2016] predniSONE  40 mg Oral Q breakfast   Continuous Infusions: . sodium chloride    . sodium chloride 125 mL/hr at 08/21/16 2045  . lactated ringers 10 mL/hr at 08/21/16 1709  . magnesium sulfate 1 - 4 g bolus IVPB       LOS: 2 days        Aline August, MD [facility] Pager [phone]  If 7PM-7AM, please contact night-coverage [email or web address] Password [email or web address] 08/23/2016, 1:36 PM

## 2016-08-23 NOTE — Progress Notes (Addendum)
  Progress Note  SUBJECTIVE:    POD #2  Right foot feels better. Complaining of some dysuria. Thinks its from his foley being removed. Had some chest discomfort last night with incentive spirometry.    OBJECTIVE:   Vitals:   08/23/16 0400 08/23/16 0651  BP: (!) 124/56   Pulse: 63   Resp: 17   Temp:  98.4 F (36.9 C)  SpO2: 97%     Intake/Output Summary (Last 24 hours) at 08/23/16 0832 Last data filed at 08/23/16 0651  Gross per 24 hour  Intake              240 ml  Output              500 ml  Net             -260 ml   Non labored respiratory effort. Lungs with decreased breath sounds at bases. Right groin no hematoma. Right leg incisions clean with staples intact. Minimal oozing from medial ankle incision.  Brisk right DP and PT doppler signals. Motor and sensory function intact. Right great toe a bit dusky.   ASSESSMENT/PLAN:   76 y.o. male is s/p: thromboembolictomy right popliteal, PT and AT arteries 2 Days Post-Op   Good doppler flow to right foot with intact motor and sensory functon.  New onset afib with RVR: NSR this am. Currently on IV amio. Continue on heparin. Appreciate cardiology following.  Still with leukocytosis. CXR with persistent right lower base opacity. Currently on doxy. Will check UA given dysuria.  Appreciate hospitalist service following.  Continue mobilizaiton and incentive spirometry.   Edward Summers 08/23/2016 8:32 AM -- LABS:   CBC    Component Value Date/Time   WBC 28.3 (H) 08/23/2016 0120   HGB 9.5 (L) 08/23/2016 0120   HCT 28.8 (L) 08/23/2016 0120   PLT 171 08/23/2016 0120    BMET    Component Value Date/Time   NA 134 (L) 08/23/2016 0120   K 3.9 08/23/2016 0120   CL 103 08/23/2016 0120   CO2 24 08/23/2016 0120   GLUCOSE 186 (H) 08/23/2016 0120   BUN 16 08/23/2016 0120   CREATININE 0.73 08/23/2016 0120   CALCIUM 7.7 (L) 08/23/2016 0120   GFRNONAA >60 08/23/2016 0120   GFRAA >60 08/23/2016 0120    COAG Lab Results    Component Value Date   INR 1.14 08/21/2016   No results found for: PTT  ANTIBIOTICS:   Anti-infectives    Start     Dose/Rate Route Frequency Ordered Stop   08/21/16 2359  cefUROXime (ZINACEF) 1.5 g in dextrose 5 % 50 mL IVPB     1.5 g 100 mL/hr over 30 Minutes Intravenous Every 12 hours 08/21/16 2254 08/22/16 1202   08/21/16 2300  doxycycline (VIBRA-TABS) tablet 100 mg     100 mg Oral Every 12 hours 08/21/16 2132         Virgina Jock, PA-C [facility] Office: [phone] Pager: [phone] 08/23/2016 8:32 AM   I agree with the above.  I have seen and evaluated the patient.  His foot shows no sign of ischemia.  He does have bilateral edema.  His incisions are healing nicely.  We are allowing his toes to demarcate.  Appreciate cardiology and hospitalist assistance.  Continue IV heparin.  UA is pending.  Annamarie Major

## 2016-08-23 NOTE — Progress Notes (Signed)
Progress Note  Patient Name: Edward Summers Date of Encounter: 08/23/2016  Primary Cardiologist: Dr. Debara Pickett  Subjective   No CP, no SOB. Did not feel his atrial fibrillation. He converted last evening. Right leg edema still noted.  Inpatient Medications    Scheduled Meds: . atorvastatin  40 mg Oral q1800  . dextromethorphan-guaiFENesin  1 tablet Oral BID  . diltiazem  10 mg Intravenous Once  . docusate sodium  100 mg Oral Daily  . doxycycline  100 mg Oral Q12H  . feeding supplement  1 Container Oral TID BM  . ipratropium  0.5 mg Nebulization TID  . levalbuterol  1.25 mg Nebulization TID  . methylPREDNISolone (SOLU-MEDROL) injection  40 mg Intravenous Q12H  . mometasone-formoterol  2 puff Inhalation BID  . pantoprazole  40 mg Oral Daily   Continuous Infusions: . sodium chloride    . sodium chloride 125 mL/hr at 08/21/16 2045  . amiodarone 30 mg/hr (08/22/16 2229)  . diltiazem (CARDIZEM) infusion    . heparin 1,200 Units/hr (08/22/16 2111)  . lactated ringers 10 mL/hr at 08/21/16 1709  . magnesium sulfate 1 - 4 g bolus IVPB     PRN Meds: sodium chloride, acetaminophen **OR** acetaminophen, alum & mag hydroxide-simeth, bisacodyl, guaiFENesin-dextromethorphan, hydrALAZINE, levalbuterol, magnesium sulfate 1 - 4 g bolus IVPB, metoprolol tartrate, ondansetron, oxyCODONE-acetaminophen, phenol, potassium chloride, senna-docusate   Vital Signs    Vitals:   08/23/16 0400 08/23/16 0651 08/23/16 0735 08/23/16 0849  BP: (!) 124/56  127/72   Pulse: 63  65   Resp: 17  16   Temp:  98.4 F (36.9 C)  (!) 97.5 F (36.4 C)  TempSrc:  Oral  Oral  SpO2: 97%  98%   Weight:      Height:        Intake/Output Summary (Last 24 hours) at 08/23/16 0959 Last data filed at 08/23/16 0850  Gross per 24 hour  Intake              480 ml  Output              500 ml  Net              -20 ml   Filed Weights   08/21/16 1716 08/21/16 2258  Weight: 138 lb (62.6 kg) 156 lb 14.4 oz (71.2 kg)     Telemetry    Now normal sinus rhythm, 60s - Personally Reviewed  ECG    Atrial fibrillation - Personally Reviewed  Physical Exam   GEN: No acute distress.   Neck: No JVD Cardiac: RRR, no murmurs, rubs, or gallops.  Respiratory: Clear to auscultation bilaterally. GI: Soft, nontender, non-distended  MS: No edema; No deformity. Right lower extremity edema, 2+, surgical wound noted Neuro:  Nonfocal  Psych: Normal affect   Labs    Chemistry Recent Labs Lab 08/21/16 1628 08/22/16 0225 08/22/16 0607 08/23/16 0120  NA 134* 136 134* 134*  K 4.3 4.4 4.0 3.9  CL 101 106 105 103  CO2 24 20* 22 24  GLUCOSE 113* 190* 171* 186*  BUN 12 16 16 16   CREATININE 0.78 0.83 0.77 0.73  CALCIUM 8.2* 7.7* 7.7* 7.7*  PROT 5.5*  --   --   --   ALBUMIN 2.2*  --   --   --   AST 22  --   --   --   ALT 31  --   --   --   ALKPHOS 85  --   --   --  BILITOT 1.2  --   --   --   GFRNONAA >60 >60 >60 >60  GFRAA >60 >60 >60 >60  ANIONGAP 9 10 7 7      Hematology Recent Labs Lab 08/22/16 0225 08/22/16 0607 08/23/16 0120  WBC 34.1* 26.3* 28.3*  RBC 4.14* 3.88* 3.40*  HGB 11.8* 10.8* 9.5*  HCT 35.5* 33.2* 28.8*  MCV 85.7 85.6 84.7  MCH 28.5 27.8 27.9  MCHC 33.2 32.5 33.0  RDW 16.1* 16.1* 15.9*  PLT 210 163 171    Cardiac Enzymes Recent Labs Lab 08/21/16 2141 08/22/16 0225 08/22/16 1036  TROPONINI 0.03* 0.06* <0.03   No results for input(s): TROPIPOC in the last 168 hours.   BNP Recent Labs Lab 08/21/16 1628 08/22/16 0225  BNP 53.8 202.3*     DDimer No results for input(s): DDIMER in the last 168 hours.   Radiology    Dg Chest 2 View  Result Date: 08/23/2016 CLINICAL DATA:  Dyspnea. Hx of smoking. Emphysema of lung, HTN, PNA and diabetes. Pt came into hospital for LLE swelling and numbness. EXAM: CHEST  2 VIEW COMPARISON:  08/21/2016 FINDINGS: Cardiac silhouette is mildly enlarged. No mediastinal or hilar masses. Small, right greater than left, pleural effusions with  associated lung base opacity, most likely atelectasis. Right lower lobe pneumonia should be considered likely if there are consistent clinical symptoms. Pleural effusions have increased when compared the prior exam. Remainder of the lungs is clear. No pneumothorax. IMPRESSION: 1. Mild increase in lung base opacity which is likely due to an increase in pleural effusions, right greater than left. 2. Persistent right lung base opacity which may reflect atelectasis, pneumonia or a combination. 3. No evidence of pulmonary edema. Electronically Signed   By: Lajean Manes M.D.   On: 08/23/2016 08:30   Dg Chest Portable 1 View  Result Date: 08/21/2016 CLINICAL DATA:  Shortness of breath, bilateral lower extremity edema and discoloration. EXAM: PORTABLE CHEST 1 VIEW COMPARISON:  PA and lateral chest x-ray of July 04, 2016 FINDINGS: The left lung is well-expanded and clear. On the right there is increased density at the lung base. The heart is mildly enlarged. The pulmonary vascularity is normal. There is no pleural effusion. There is calcification in the wall of the aortic arch. The bony thorax exhibits no acute abnormality. IMPRESSION: Right lower lobe atelectasis or pneumonia. Probable underlying COPD or reactive airway disease. Followup PA and lateral chest X-ray is recommended in 3-4 weeks following trial of antibiotic therapy to ensure resolution and exclude underlying malignancy. No evidence of pulmonary edema or pleural effusions. Electronically Signed   By: David  Martinique M.D.   On: 08/21/2016 15:33   Dg Foot 2 Views Left  Result Date: 08/21/2016 Please see detailed radiograph report in office note.  Dg Foot 2 Views Right  Result Date: 08/21/2016 Please see detailed radiograph report in office note.   Cardiac Studies   Echocardiogram 08/1716 - Left ventricle: The cavity size was normal. Systolic function was   normal. The estimated ejection fraction was in the range of 60%   to 65%. Wall motion was  normal; there were no regional wall   motion abnormalities. - Aortic valve: Transvalvular velocity was within the normal range.   There was no stenosis. There was no regurgitation. - Mitral valve: Transvalvular velocity was within the normal range.   There was no evidence for stenosis. There was no regurgitation. - Right ventricle: The cavity size was normal. Wall thickness was   normal. Systolic  function was normal. - Atrial septum: No defect or patent foramen ovale was identified   by color flow Doppler. - Tricuspid valve: There was no regurgitation.  Patient Profile     76 y.o. male with atrial fibrillation and rapid ventricular response in the setting of acute limb ischemia  Assessment & Plan    Paroxysmal atrial fibrillation  - Converted to sinus rhythm yesterday, 08/22/16 at around 6 PM.  - In the setting of acute limb ischemia  - Echocardiogram reassuring with normal EF.  - Amiodarone IV--we'll convert to by mouth 200 mg twice a day.   - Reevaluate need for amiodarone in approximately 4 weeks.  - I also think it would be a good idea to place him on Eliquis 5 mg twice a day as we do not know if his limb ischemia may have been secondary to embolic phenomenon as a result of atrial fibrillation. He was asymptomatic with his atrial fibrillation. I will stop his IV heparin.  Chronic anticoagulation  - I will place him on Eliquis for atrial fibrillation.  Peripheral vascular disease  - Right lower extremity embolic phenomenon status post thrombectomy.  Signed, Candee Furbish, MD  08/23/2016, 9:59 AM

## 2016-08-23 NOTE — Progress Notes (Signed)
PT Cancellation Note  Patient Details Name: Edward Summers MRN: [id number] DOB: [birth year]   Cancelled Treatment:    Reason Eval/Treat Not Completed: Pain limiting ability to participate Attempted to see patient x2 today.  Unable to participate due to pain.  Will return at later date.  Despina Pole 08/23/2016, 7:51 PM Carita Pian. Sanjuana Kava, [place] Pager [phone]

## 2016-08-23 NOTE — Progress Notes (Signed)
Late entry: Assumed care from off going RN  Patient alert & oriented; no signs of acute distress; c/o's shooting pain; pain medication given @ 1521  Reassessment completed; MD notified @1740  spoke with vascular MD; updated Dr. Trula Slade about patient pulses and assessment of right foot. Keep doppler ; no new order

## 2016-08-23 NOTE — Progress Notes (Signed)
ANTICOAGULATION CONSULT NOTE - Follow Up Consult  Pharmacy Consult for Heparin  Indication: ischemic foot/afib  Allergies  Allergen Reactions  . Zithromax [Azithromycin] Other (See Comments)    Side pain   Patient Measurements: Height: 5\' 7"  (170.2 cm) Weight: 156 lb 14.4 oz (71.2 kg) IBW/kg (Calculated) : 66.1  Vital Signs: Temp: 98 F (36.7 C) (08/17 2045) BP: 125/56 (08/18 0000) Pulse Rate: 60 (08/18 0000)  Labs:  Recent Labs  08/21/16 1628 08/21/16 2141 08/22/16 0225 08/22/16 0607 08/22/16 1036 08/22/16 1432 08/23/16 0120  HGB 13.7  --  11.8* 10.8*  --   --  9.5*  HCT 40.0  --  35.5* 33.2*  --   --  28.8*  PLT 176  --  210 163  --   --  171  LABPROT 14.7  --   --   --   --   --   --   INR 1.14  --   --   --   --   --   --   HEPARINUNFRC  --   --   --  <0.10*  --  0.23* 0.52  0.43  CREATININE 0.78  --  0.83 0.77  --   --  0.73  TROPONINI  --  0.03* 0.06*  --  <0.03  --   --     Estimated Creatinine Clearance: 75.7 mL/min (by C-G formula based on SCr of 0.73 mg/dL).  Assessment: 76 y/o s/p thromboembolectomy on 8/16, also developed post-op afib which has converted to NSR, heparin level is therapeutic x 1 after rate increase  Goal of Therapy:  Heparin level 0.3-0.7 units/ml Monitor platelets by anticoagulation protocol: Yes   Plan:  -Cont heparin 1200 units/hr -1000 HL  Ercole Georg 08/23/2016,2:48 AM

## 2016-08-23 NOTE — Progress Notes (Deleted)
ANTICOAGULATION CONSULT NOTE - Follow Up Consult  Pharmacy Consult for Heparin  Indication: ischemic foot/afib  Allergies  Allergen Reactions  . Zithromax [Azithromycin] Other (See Comments)    Side pain   Patient Measurements: Height: 5\' 7"  (170.2 cm) Weight: 156 lb 14.4 oz (71.2 kg) IBW/kg (Calculated) : 66.1  Vital Signs: Temp: 97.5 F (36.4 C) (08/18 0849) Temp Source: Oral (08/18 0849) BP: 127/72 (08/18 0735) Pulse Rate: 65 (08/18 0735)  Labs:  Recent Labs  08/21/16 1628 08/21/16 2141 08/22/16 0225  08/22/16 0607 08/22/16 1036 08/22/16 1432 08/23/16 0120 08/23/16 0954  HGB 13.7  --  11.8*  --  10.8*  --   --  9.5*  --   HCT 40.0  --  35.5*  --  33.2*  --   --  28.8*  --   PLT 176  --  210  --  163  --   --  171  --   LABPROT 14.7  --   --   --   --   --   --   --   --   INR 1.14  --   --   --   --   --   --   --   --   HEPARINUNFRC  --   --   --   < > <0.10*  --  0.23* 0.52  0.43 0.57  CREATININE 0.78  --  0.83  --  0.77  --   --  0.73  --   TROPONINI  --  0.03* 0.06*  --   --  <0.03  --   --   --   < > = values in this interval not displayed.  Estimated Creatinine Clearance: 75.7 mL/min (by C-G formula based on SCr of 0.73 mg/dL).  Assessment: 76 y/o s/p thromboembolectomy on 8/16, also developed post-op afib which has converted to NSR; HgB down 10.8>9.5; no bldding or infusion related issues documented  Heparin level therapeutic: 0.57  Goal of Therapy:  Heparin level 0.3-0.7 units/ml Monitor platelets by anticoagulation protocol: Yes   Plan:  -Continue heparin gtt at 1200 units/hr -Daily heparin level/CBC -Monitor for s/sx of bleeding  Blayne Garlick L Kambry Takacs 08/23/2016,1:19 PM

## 2016-08-24 DIAGNOSIS — I10 Essential (primary) hypertension: Secondary | ICD-10-CM

## 2016-08-24 LAB — BASIC METABOLIC PANEL
ANION GAP: 5 (ref 5–15)
BUN: 16 mg/dL (ref 6–20)
CALCIUM: 7.5 mg/dL — AB (ref 8.9–10.3)
CO2: 24 mmol/L (ref 22–32)
Chloride: 102 mmol/L (ref 101–111)
Creatinine, Ser: 0.71 mg/dL (ref 0.61–1.24)
GFR calc Af Amer: >60 mL/min (ref >60)
GFR calc non Af Amer: >60 mL/min (ref >60)
GLUCOSE: 104 mg/dL — AB (ref 65–99)
Potassium: 3.7 mmol/L (ref 3.5–5.1)
Sodium: 131 mmol/L — ABNORMAL LOW (ref 135–145)

## 2016-08-24 LAB — TYPE AND SCREEN
ABO/RH(D): A POS
Antibody Screen: NEGATIVE

## 2016-08-24 LAB — URINE CULTURE: Culture: NO GROWTH

## 2016-08-24 LAB — CBC
HCT: 29.9 % — ABNORMAL LOW (ref 39.0–52.0)
HEMATOCRIT: 29.8 % — AB (ref 39.0–52.0)
HEMOGLOBIN: 10 g/dL — AB (ref 13.0–17.0)
Hemoglobin: 9.8 g/dL — ABNORMAL LOW (ref 13.0–17.0)
MCH: 28 pg (ref 26.0–34.0)
MCH: 28 pg (ref 26.0–34.0)
MCHC: 32.9 g/dL (ref 30.0–36.0)
MCHC: 33.4 g/dL (ref 30.0–36.0)
MCV: 85.1 fL (ref 78.0–100.0)
MCV: 83.8 fL (ref 78.0–100.0)
Platelets: 157 10*3/uL (ref 150–400)
Platelets: 163 10*3/uL (ref 150–400)
RBC: 3.5 MIL/uL — ABNORMAL LOW (ref 4.22–5.81)
RBC: 3.57 MIL/uL — ABNORMAL LOW (ref 4.22–5.81)
RDW: 16.4 % — ABNORMAL HIGH (ref 11.5–15.5)
RDW: 15.5 % (ref 11.5–15.5)
WBC: 20.3 10*3/uL — AB (ref 4.0–10.5)
WBC: 27.5 10*3/uL — ABNORMAL HIGH (ref 4.0–10.5)

## 2016-08-24 LAB — ABO/RH: ABO/RH(D): A POS

## 2016-08-24 MED ORDER — LEVALBUTEROL HCL 0.63 MG/3ML IN NEBU
0.6300 mg | INHALATION_SOLUTION | RESPIRATORY_TRACT | Status: DC | PRN
  Administered 2016-09-05: 0.63 mg via RESPIRATORY_TRACT
  Filled 2016-08-24 (×2): qty 3

## 2016-08-24 MED ORDER — PREDNISONE 20 MG PO TABS
30.0000 mg | ORAL_TABLET | Freq: Every day | ORAL | Status: DC
  Administered 2016-08-26 – 2016-09-02 (×8): 30 mg via ORAL
  Filled 2016-08-24 (×8): qty 1

## 2016-08-24 MED ORDER — IPRATROPIUM BROMIDE 0.02 % IN SOLN
0.5000 mg | RESPIRATORY_TRACT | Status: DC | PRN
  Administered 2016-09-09: 0.5 mg via RESPIRATORY_TRACT
  Filled 2016-08-24 (×3): qty 2.5

## 2016-08-24 NOTE — Progress Notes (Addendum)
Patient had episode of coughing up red blood this afternoon.  When I saw him tonight it had resolved.  I discontinued his Eliquis.  Hb was unchanged after the episode.  A type and screen was sent.  He may need GI evaluation, however I suspect this is secondary to anticoagulation, intubation, and coughing.  He is having pain in his right foot.  He still has intact motor and sensory function to the foot, but the plantar surface is discolored, although not significantly changed over the weekend.  I discussed that we should proceed with angiography to define his anatomy.  I will try to arrange this for tomorrow morning.  He has only been off of the Eliquis since today, so there is a slightly higher risk of bleeding, however I feel the benefits outweigh the risks.  He understands the increased risk of bleeding.  He will be NPO after midnight.   Annamarie Major

## 2016-08-24 NOTE — Progress Notes (Addendum)
Patient coughing up bright red blood.  Per patient no history of hemoptysis.  Lung sounds clear, heart tones regular.  No distress or SOB.  BP 141/80  SR 98 RR 22 O2 sat 93% on [place].  Mouth care done, no observed source of bleeding.   RN notified MD.  Type and Screen, CBC, and hold eliquis

## 2016-08-24 NOTE — Discharge Instructions (Signed)

## 2016-08-24 NOTE — Progress Notes (Signed)
    Subjective  - POD #3  Some shooting pain in foot yesterday   Physical Exam:  Bottom of foot slightly more discolored Normal motor and sensory function Doppler PT signal       Assessment/Plan:  POD #3  On Eliquis UA negative Mobilize COPD exacerbation:  On steroid taper ID:  Doxy changed to levaquin WBC elevated, continue to follow  Edward Summers, [place] 08/24/2016 11:17 AM --  Vitals:   08/24/16 0400 08/24/16 0834  BP: 128/79 127/71  Pulse: 96 96  Resp: (!) 22 19  Temp:  98.5 F (36.9 C)  SpO2: 93% 95%    Intake/Output Summary (Last 24 hours) at 08/24/16 1117 Last data filed at 08/23/16 2000  Gross per 24 hour  Intake                0 ml  Output              200 ml  Net             -200 ml     Laboratory CBC    Component Value Date/Time   WBC 20.3 (H) 08/24/2016 0233   HGB 9.8 (L) 08/24/2016 0233   HCT 29.8 (L) 08/24/2016 0233   PLT 157 08/24/2016 0233    BMET    Component Value Date/Time   NA 131 (L) 08/24/2016 0233   K 3.7 08/24/2016 0233   CL 102 08/24/2016 0233   CO2 24 08/24/2016 0233   GLUCOSE 104 (H) 08/24/2016 0233   BUN 16 08/24/2016 0233   CREATININE 0.71 08/24/2016 0233   CALCIUM 7.5 (L) 08/24/2016 0233   GFRNONAA >60 08/24/2016 0233   GFRAA >60 08/24/2016 0233    COAG Lab Results  Component Value Date   INR 1.14 08/21/2016   No results found for: PTT  Antibiotics Anti-infectives    Start     Dose/Rate Route Frequency Ordered Stop   08/23/16 1345  levofloxacin (LEVAQUIN) tablet 750 mg     750 mg Oral Daily 08/23/16 1344     08/21/16 2359  cefUROXime (ZINACEF) 1.5 g in dextrose 5 % 50 mL IVPB     1.5 g 100 mL/hr over 30 Minutes Intravenous Every 12 hours 08/21/16 2254 08/22/16 1202   08/21/16 2300  doxycycline (VIBRA-TABS) tablet 100 mg  Status:  Discontinued     100 mg Oral Every 12 hours 08/21/16 2132 08/23/16 1344       V. Leia Alf, M.D. [facility] Office:  [phone] Pager:  [phone]

## 2016-08-24 NOTE — Progress Notes (Signed)
Progress Note  Patient Name: Edward Summers Date of Encounter: 08/24/2016  Primary Cardiologist: Dr. Debara Pickett  Subjective   No CP,  No SOB. Remember, was asymptomatic with his AFIB. Converted evening of 8/17. Up in chair now.   Inpatient Medications    Scheduled Meds: . amiodarone  200 mg Oral BID  . apixaban  5 mg Oral BID  . atorvastatin  40 mg Oral q1800  . dextromethorphan-guaiFENesin  1 tablet Oral BID  . diltiazem  10 mg Intravenous Once  . docusate sodium  100 mg Oral Daily  . feeding supplement  1 Container Oral TID BM  . levofloxacin  750 mg Oral Daily  . mometasone-formoterol  2 puff Inhalation BID  . pantoprazole  40 mg Oral Daily  . predniSONE  40 mg Oral Q breakfast   Continuous Infusions: . sodium chloride    . sodium chloride 125 mL/hr at 08/21/16 2045  . lactated ringers 10 mL/hr at 08/21/16 1709  . magnesium sulfate 1 - 4 g bolus IVPB     PRN Meds: sodium chloride, acetaminophen **OR** acetaminophen, alum & mag hydroxide-simeth, bisacodyl, guaiFENesin-dextromethorphan, hydrALAZINE, ipratropium, levalbuterol, magnesium sulfate 1 - 4 g bolus IVPB, metoprolol tartrate, ondansetron, oxyCODONE-acetaminophen, phenol, potassium chloride, senna-docusate   Vital Signs    Vitals:   08/23/16 2133 08/24/16 0000 08/24/16 0400 08/24/16 0834  BP: 128/73 (!) 106/44 128/79 127/71  Pulse: 88 67 96 96  Resp: 18 15 (!) 22 19  Temp: 98.3 F (36.8 C)   98.5 F (36.9 C)  TempSrc: Oral   Oral  SpO2: 95% 94% 93% 95%  Weight:      Height:        Intake/Output Summary (Last 24 hours) at 08/24/16 0949 Last data filed at 08/23/16 2000  Gross per 24 hour  Intake                0 ml  Output              200 ml  Net             -200 ml   Filed Weights   08/21/16 1716 08/21/16 2258  Weight: 138 lb (62.6 kg) 156 lb 14.4 oz (71.2 kg)    Telemetry    Now normal sinus rhythm, 60-80s - Personally Reviewed  ECG    Atrial fibrillation - Personally Reviewed  Physical  Exam   GEN: Well nourished, well developed, in no acute distress  HEENT: normal  Neck: no JVD, carotid bruits, or masses Cardiac: RRR; no murmurs, rubs, or gallops,no edema  Respiratory:  clear to auscultation bilaterally, normal work of breathing GI: soft, nontender, nondistended, + BS MS: wounds noted, c/d/i, mild blood around incision.  Skin: warm and dry, no rash Neuro:  Alert and Oriented x 3, Strength and sensation are intact Psych: euthymic mood, full affect   Labs    Chemistry Recent Labs Lab 08/21/16 1628  08/22/16 0607 08/23/16 0120 08/24/16 0233  NA 134*  < > 134* 134* 131*  K 4.3  < > 4.0 3.9 3.7  CL 101  < > 105 103 102  CO2 24  < > 22 24 24   GLUCOSE 113*  < > 171* 186* 104*  BUN 12  < > 16 16 16   CREATININE 0.78  < > 0.77 0.73 0.71  CALCIUM 8.2*  < > 7.7* 7.7* 7.5*  PROT 5.5*  --   --   --   --   ALBUMIN  2.2*  --   --   --   --   AST 22  --   --   --   --   ALT 31  --   --   --   --   ALKPHOS 85  --   --   --   --   BILITOT 1.2  --   --   --   --   GFRNONAA >60  < > >60 >60 >60  GFRAA >60  < > >60 >60 >60  ANIONGAP 9  < > 7 7 5   < > = values in this interval not displayed.   Hematology  Recent Labs Lab 08/22/16 0607 08/23/16 0120 08/24/16 0233  WBC 26.3* 28.3* 20.3*  RBC 3.88* 3.40* 3.50*  HGB 10.8* 9.5* 9.8*  HCT 33.2* 28.8* 29.8*  MCV 85.6 84.7 85.1  MCH 27.8 27.9 28.0  MCHC 32.5 33.0 32.9  RDW 16.1* 15.9* 16.4*  PLT 163 171 157    Cardiac Enzymes  Recent Labs Lab 08/21/16 2141 08/22/16 0225 08/22/16 1036  TROPONINI 0.03* 0.06* <0.03   No results for input(s): TROPIPOC in the last 168 hours.   BNP  Recent Labs Lab 08/21/16 1628 08/22/16 0225  BNP 53.8 202.3*     DDimer No results for input(s): DDIMER in the last 168 hours.   Radiology    Dg Chest 2 View  Result Date: 08/23/2016 CLINICAL DATA:  Dyspnea. Hx of smoking. Emphysema of lung, HTN, PNA and diabetes. Pt came into hospital for LLE swelling and numbness. EXAM:  CHEST  2 VIEW COMPARISON:  08/21/2016 FINDINGS: Cardiac silhouette is mildly enlarged. No mediastinal or hilar masses. Small, right greater than left, pleural effusions with associated lung base opacity, most likely atelectasis. Right lower lobe pneumonia should be considered likely if there are consistent clinical symptoms. Pleural effusions have increased when compared the prior exam. Remainder of the lungs is clear. No pneumothorax. IMPRESSION: 1. Mild increase in lung base opacity which is likely due to an increase in pleural effusions, right greater than left. 2. Persistent right lung base opacity which may reflect atelectasis, pneumonia or a combination. 3. No evidence of pulmonary edema. Electronically Signed   By: Lajean Manes M.D.   On: 08/23/2016 08:30    Cardiac Studies   Echocardiogram 08/1716 - Left ventricle: The cavity size was normal. Systolic function was   normal. The estimated ejection fraction was in the range of 60%   to 65%. Wall motion was normal; there were no regional wall   motion abnormalities. - Aortic valve: Transvalvular velocity was within the normal range.   There was no stenosis. There was no regurgitation. - Mitral valve: Transvalvular velocity was within the normal range.   There was no evidence for stenosis. There was no regurgitation. - Right ventricle: The cavity size was normal. Wall thickness was   normal. Systolic function was normal. - Atrial septum: No defect or patent foramen ovale was identified   by color flow Doppler. - Tricuspid valve: There was no regurgitation.  Patient Profile     76 y.o. male with atrial fibrillation and rapid ventricular response in the setting of acute limb ischemia  Assessment & Plan    Paroxysmal atrial fibrillation  - Converted to sinus rhythm 2 days ago, 08/22/16 at around 6 PM.  - In the setting of acute limb ischemia  - Echocardiogram reassuring with normal EF.  - Amiodarone 200 mg twice a day. In 2 weeks change  to 200 mg PO QD.   - Reevaluate need for amiodarone in approximately 4 weeks.  - Eliquis 5 mg twice a day as we do not know if his limb ischemia may have been secondary to embolic phenomenon as a result of atrial fibrillation. He was asymptomatic with his atrial fibrillation.  Chronic anticoagulation  - Eliquis for atrial fibrillation/ anticoagulation in place of heparin.  Peripheral vascular disease  - Right lower extremity embolic phenomenon status post thrombectomy.  - Eliquis for oral anticoagulation.   - Dr. Trula Slade note reviewed.   Signed, Candee Furbish, MD  08/24/2016, 9:49 AM

## 2016-08-24 NOTE — Progress Notes (Addendum)
Late entry: assumed care from off going RN ; morning shift patient has been alert & oriented; assisted patient to bedside commode; then patient up in chair; patient had no c/o's or denies pain while in chair; visitors just left and now patient cough up bright blood; patient was trying to clear chest from congestion; patient has a good size of blood in cup; charge nurse aware and assessed patient with me; MD paged; MD returned call; orders received; see epic  @[phone]  apixaban  discontinued per verbal order

## 2016-08-24 NOTE — Progress Notes (Signed)
Called to patient room. Patient with complaints of coughing up blood. Patient appears to be coughing up bright red blood. Lungs sounds are Rhonchi. Patient  sats 92 on room air RR 19. Breathing is unlabored at this time. MD on call Paged will await call back. Will monitor patient. Kidada Ging, Bettina Gavia rN

## 2016-08-24 NOTE — Progress Notes (Signed)
PT Cancellation Note  Patient Details Name: Edward Summers MRN: [id number] DOB: 1940-03-07   Cancelled Treatment:    Reason Eval/Treat Not Completed: Medical issues which prohibited therapy.  Attempted to see patient this pm.  Patient coughing up bright red blood.  RN aware and working with patient.  Will return at later date.   Despina Pole 08/24/2016, 7:55 PM Carita Pian. Sanjuana Kava, [place] Pager [phone]

## 2016-08-24 NOTE — Progress Notes (Signed)
Patient ID: Edward Summers, male   DOB: 1940-08-25, 76 y.o.   MRN: [id number]  PROGRESS NOTE    Edward Summers  WUJ:[id number] DOB: 28-May-1940 DOA: 08/21/2016 PCP: Jilda Panda, MD   Brief Narrative:  76 year old male with history of hypertension, hyperlipidemia, emphysema who was admitted by vascular surgery for acute right leg ischemia and underwent surgical thromboembolectomy of right popliteal, PT and ATarteries. Hospitalist was consulted because patient developed postprocedure atrial fibrillation with rapid ventricular rate and hypotension. Cardiology was also consulted for the same.   Assessment & Plan:   Principal Problem:   Ischemic pain of foot, right Active Problems:   New onset atrial fibrillation (HCC)   Atrial fibrillation with RVR (HCC)   HLD (hyperlipidemia)   Essential hypertension   COPD with acute exacerbation (HCC)   Ischemic foot   COPD exacerbation:  - Improving. Decrease prednisone to 30 mg daily and taper off in the next few days - Continue Dulera. Continue nebs. Patient will need to continue [facility] upon discharge along with when necessary albuterol. - Counseled about tobacco cessation. Patient might need outpatient pulmonary evaluation - Incentive spirometry   Right-sided atelectasis versus pneumonia - Finish 5 day of Levaquin therapy -Recommend discontinuing IV fluids  Paroxysmal atrial fibrillation with rapid ventricular rate - Currently in sinus rhythm. Cardiology is following   Right lower extremity acute ischemia:status postsurgical thromboembolectomy of R popliteal,PT and AT arteries. On Eliquis.  Management as per primary vascular surgery team   HTN: Bp stable -Monitor blood pressure  HLD:  -Lipitor  Leukocytosis - Probably reactive versus secondary to pneumonia. Improving  Hospitalist service will sign off. please recall [place] if needed  Subjective: Patient seen and examined at bedside. No overnight fever. Slightly nauseous  this morning.  Objective: Vitals:   08/23/16 2133 08/24/16 0000 08/24/16 0400 08/24/16 0834  BP: 128/73 (!) 106/44 128/79 127/71  Pulse: 88 67 96 96  Resp: 18 15 (!) 22 19  Temp: 98.3 F (36.8 C)   98.5 F (36.9 C)  TempSrc: Oral   Oral  SpO2: 95% 94% 93% 95%  Weight:      Height:        Intake/Output Summary (Last 24 hours) at 08/24/16 1143 Last data filed at 08/23/16 2000  Gross per 24 hour  Intake                0 ml  Output              200 ml  Net             -200 ml   Filed Weights   08/21/16 1716 08/21/16 2258  Weight: 62.6 kg (138 lb) 71.2 kg (156 lb 14.4 oz)    Examination:  General exam: Appears calm and comfortable  Respiratory system: Bilateral decreased breath sound at bases With some scattered crackles but no wheezing Cardiovascular system: S1 & S2 heard, rate controlled  Gastrointestinal system: Abdomen is nondistended, soft and nontender. Normal bowel sounds heard. Extremities: No cyanosis, clubbing; 1-2+ pitting edema     Data Reviewed: I have personally reviewed following labs and imaging studies  CBC:  Recent Labs Lab 08/21/16 1628 08/22/16 0225 08/22/16 0607 08/23/16 0120 08/24/16 0233  WBC 27.6* 34.1* 26.3* 28.3* 20.3*  NEUTROABS 22.1*  --   --  25.2*  --   HGB 13.7 11.8* 10.8* 9.5* 9.8*  HCT 40.0 35.5* 33.2* 28.8* 29.8*  MCV 84.2 85.7 85.6 84.7 85.1  PLT 176 210 163 171  546   Basic Metabolic Panel:  Recent Labs Lab 08/21/16 1628 08/22/16 0225 08/22/16 0607 08/23/16 0120 08/24/16 0233  NA 134* 136 134* 134* 131*  K 4.3 4.4 4.0 3.9 3.7  CL 101 106 105 103 102  CO2 24 20* 22 24 24   GLUCOSE 113* 190* 171* 186* 104*  BUN 12 16 16 16 16   CREATININE 0.78 0.83 0.77 0.73 0.71  CALCIUM 8.2* 7.7* 7.7* 7.7* 7.5*  MG  --   --  2.0 1.9  --    GFR: Estimated Creatinine Clearance: 75.7 mL/min (by C-G formula based on SCr of 0.71 mg/dL). Liver Function Tests:  Recent Labs Lab 08/21/16 1628  AST 22  ALT 31  ALKPHOS 85  BILITOT  1.2  PROT 5.5*  ALBUMIN 2.2*   No results for input(s): LIPASE, AMYLASE in the last 168 hours. No results for input(s): AMMONIA in the last 168 hours. Coagulation Profile:  Recent Labs Lab 08/21/16 1628  INR 1.14   Cardiac Enzymes:  Recent Labs Lab 08/21/16 2141 08/22/16 0225 08/22/16 1036  TROPONINI 0.03* 0.06* <0.03   BNP (last 3 results) No results for input(s): PROBNP in the last 8760 hours. HbA1C:  Recent Labs  08/22/16 0225  HGBA1C 6.5*   CBG: No results for input(s): GLUCAP in the last 168 hours. Lipid Profile:  Recent Labs  08/22/16 0225  CHOL 89  HDL 21*  LDLCALC 55  TRIG 66  CHOLHDL 4.2   Thyroid Function Tests:  Recent Labs  08/22/16 0225  TSH 0.490  FREET4 1.52*  T3FREE 2.6   Anemia Panel: No results for input(s): VITAMINB12, FOLATE, FERRITIN, TIBC, IRON, RETICCTPCT in the last 72 hours. Sepsis Labs: No results for input(s): PROCALCITON, LATICACIDVEN in the last 168 hours.  No results found for this or any previous visit (from the past 240 hour(s)).       Radiology Studies: Dg Chest 2 View  Result Date: 08/23/2016 CLINICAL DATA:  Dyspnea. Hx of smoking. Emphysema of lung, HTN, PNA and diabetes. Pt came into hospital for LLE swelling and numbness. EXAM: CHEST  2 VIEW COMPARISON:  08/21/2016 FINDINGS: Cardiac silhouette is mildly enlarged. No mediastinal or hilar masses. Small, right greater than left, pleural effusions with associated lung base opacity, most likely atelectasis. Right lower lobe pneumonia should be considered likely if there are consistent clinical symptoms. Pleural effusions have increased when compared the prior exam. Remainder of the lungs is clear. No pneumothorax. IMPRESSION: 1. Mild increase in lung base opacity which is likely due to an increase in pleural effusions, right greater than left. 2. Persistent right lung base opacity which may reflect atelectasis, pneumonia or a combination. 3. No evidence of pulmonary  edema. Electronically Signed   By: Lajean Manes M.D.   On: 08/23/2016 08:30        Scheduled Meds: . amiodarone  200 mg Oral BID  . apixaban  5 mg Oral BID  . atorvastatin  40 mg Oral q1800  . dextromethorphan-guaiFENesin  1 tablet Oral BID  . diltiazem  10 mg Intravenous Once  . docusate sodium  100 mg Oral Daily  . feeding supplement  1 Container Oral TID BM  . levofloxacin  750 mg Oral Daily  . mometasone-formoterol  2 puff Inhalation BID  . pantoprazole  40 mg Oral Daily  . [START ON 08/25/2016] predniSONE  30 mg Oral Q breakfast   Continuous Infusions: . sodium chloride    . sodium chloride 125 mL/hr at 08/21/16 2045  .  lactated ringers 10 mL/hr at 08/21/16 1709  . magnesium sulfate 1 - 4 g bolus IVPB       LOS: 3 days        Aline August, MD [facility] Pager [phone]  If 7PM-7AM, please contact night-coverage [email or web address] Password [facility] 08/24/2016, 11:43 AM

## 2016-08-24 NOTE — Evaluation (Signed)
Occupational Therapy Evaluation Patient Details Name: Edward Summers MRN: [id number] DOB: 1940-06-25 Today's Date: 08/24/2016    History of Present Illness 76 year old male with history of hypertension, hyperlipidemia, emphysema who was admitted by vascular surgery for acute right leg ischemia and underwent surgical thromboembolectomy of right popliteal, PT and AT arteries. Hospitalist was consulted because patient developed postprocedure atrial fibrillation with rapid ventricular rate and hypotension. Cardiology was also consulted for the same.    PMH:  HLD, HTN, COPD   Clinical Impression   Pt reports he was independent with ADL PTA. Currently pt requires min assist for short distance functional mobility and mod-max assist for LB ADL. Pt planning to d/c home with supervision from family. Recommending HHOT for follow up to maximize independence and safety with ADL and functional mobility upon return home. Pt would benefit from continued skilled OT to address established goals.    Follow Up Recommendations  Home health OT;Supervision/Assistance - 24 hour    Equipment Recommendations  3 in 1 bedside commode    Recommendations for Other Services       Precautions / Restrictions Precautions Precautions: Fall Restrictions Weight Bearing Restrictions: No      Mobility Bed Mobility               General bed mobility comments: Pt OOB in chair upon arrival  Transfers Overall transfer level: Needs assistance Equipment used: Rolling walker (2 wheeled) Transfers: Sit to/from Stand Sit to Stand: Mod assist         General transfer comment: to boost up from chair. Cues for hand placement and technique    Balance Overall balance assessment: Needs assistance Sitting-balance support: Feet supported Sitting balance-Leahy Scale: Good     Standing balance support: Bilateral upper extremity supported Standing balance-Leahy Scale: Poor Standing balance comment: RW for support                           ADL either performed or assessed with clinical judgement   ADL Overall ADL's : Needs assistance/impaired Eating/Feeding: Set up;Sitting   Grooming: Set up;Sitting   Upper Body Bathing: Set up;Sitting   Lower Body Bathing: Moderate assistance;Sit to/from stand   Upper Body Dressing : Set up;Sitting   Lower Body Dressing: Maximal assistance;Sit to/from stand Lower Body Dressing Details (indicate cue type and reason): to don R sock Toilet Transfer: Minimal assistance;Stand-pivot;RW Toilet Transfer Details (indicate cue type and reason): simulated by sit to stand from chair with few steps forward/backward         Functional mobility during ADLs: Minimal assistance;Rolling walker General ADL Comments: limited mobility due to pain and weakness     Vision         Perception     Praxis      Pertinent Vitals/Pain Pain Assessment: Faces Faces Pain Scale: Hurts even more Pain Location: RLE Pain Descriptors / Indicators: Aching;Sore Pain Intervention(s): Monitored during session;Repositioned     Hand Dominance     Extremity/Trunk Assessment Upper Extremity Assessment Upper Extremity Assessment: Generalized weakness   Lower Extremity Assessment Lower Extremity Assessment: Defer to PT evaluation       Communication Communication Communication: No difficulties   Cognition Arousal/Alertness: Awake/alert Behavior During Therapy: WFL for tasks assessed/performed Overall Cognitive Status: Within Functional Limits for tasks assessed  General Comments       Exercises     Shoulder Instructions      Home Living Family/patient expects to be discharged to:: Private residence Living Arrangements: Spouse/significant other Available Help at Discharge: Family;Friend(s);Available 24 hours/day Type of Home: House Home Access: Stairs to enter [employer] of Steps: 3 Entrance  Stairs-Rails: Right Home Layout: One level     Bathroom Shower/Tub: Tub/shower unit;Curtain   Biochemist, clinical: Standard     Home Equipment: Cane - single point;Wheelchair - manual          Prior Functioning/Environment Level of Independence: Independent with assistive device(s)        Comments: Uses cane due to pain.  Takes sponge baths.        OT Problem List: Decreased strength;Decreased activity tolerance;Impaired balance (sitting and/or standing);Decreased knowledge of use of DME or AE;Pain;Increased edema      OT Treatment/Interventions: Self-care/ADL training;Energy conservation;DME and/or AE instruction;Therapeutic activities;Patient/family education;Balance training    OT Goals(Current goals can be found in the care plan section) Acute Rehab OT Goals Patient Stated Goal: return home OT Goal Formulation: With patient Time For Goal Achievement: 09/07/16 Potential to Achieve Goals: Good ADL Goals Pt Will Perform Lower Body Bathing: with supervision;sit to/from stand Pt Will Perform Lower Body Dressing: with supervision;sit to/from stand Pt Will Transfer to Toilet: with supervision;ambulating;bedside commode Pt Will Perform Toileting - Clothing Manipulation and hygiene: with supervision;sit to/from stand  OT Frequency: Min 2X/week   Barriers to D/C:            Co-evaluation              AM-PAC PT "6 Clicks" Daily Activity     Outcome Measure Help from another person eating meals?: None Help from another person taking care of personal grooming?: None Help from another person toileting, which includes using toliet, bedpan, or urinal?: A Little Help from another person bathing (including washing, rinsing, drying)?: A Lot Help from another person to put on and taking off regular upper body clothing?: A Little Help from another person to put on and taking off regular lower body clothing?: A Lot 6 Click Score: 18   End of Session Equipment Utilized During  Treatment: Gait belt;Rolling walker Nurse Communication: Mobility status  Activity Tolerance: Patient tolerated treatment well;Patient limited by pain Patient left: in chair;with call bell/phone within reach  OT Visit Diagnosis: Unsteadiness on feet (R26.81);Other abnormalities of gait and mobility (R26.89);Pain Pain - Right/Left: Right Pain - part of body: Leg                Time: 0762-2633 OT Time Calculation (min): 13 min Charges:  OT General Charges $OT Visit: 1 Procedure OT Evaluation $OT Eval Moderate Complexity: 1 Procedure G-Codes:     Alexandru Moorer A. Ulice Brilliant, M.S., OTR/L Pager: [place] 08/24/2016, 1:43 PM

## 2016-08-25 ENCOUNTER — Encounter (HOSPITAL_COMMUNITY)
Admission: EM | Disposition: A | Discharge status: Skilled Nursing Facility | Payer: Self-pay | Source: Home / Self Care | Attending: Vascular Surgery

## 2016-08-25 ENCOUNTER — Encounter (HOSPITAL_COMMUNITY): Payer: Self-pay | Admitting: Vascular Surgery

## 2016-08-25 HISTORY — PX: LOWER EXTREMITY ANGIOGRAPHY: CATH118251

## 2016-08-25 LAB — CBC
HCT: 28.1 % — ABNORMAL LOW (ref 39.0–52.0)
Hemoglobin: 9.4 g/dL — ABNORMAL LOW (ref 13.0–17.0)
MCH: 27.8 pg (ref 26.0–34.0)
MCHC: 33.5 g/dL (ref 30.0–36.0)
MCV: 83.1 fL (ref 78.0–100.0)
PLATELETS: 166 10*3/uL (ref 150–400)
RBC: 3.38 MIL/uL — AB (ref 4.22–5.81)
RDW: 15.5 % (ref 11.5–15.5)
WBC: 21.2 10*3/uL — ABNORMAL HIGH (ref 4.0–10.5)

## 2016-08-25 LAB — BASIC METABOLIC PANEL
Anion gap: 4 — ABNORMAL LOW (ref 5–15)
BUN: 18 mg/dL (ref 6–20)
CO2: 26 mmol/L (ref 22–32)
CREATININE: 0.71 mg/dL (ref 0.61–1.24)
Calcium: 7.6 mg/dL — ABNORMAL LOW (ref 8.9–10.3)
Chloride: 99 mmol/L — ABNORMAL LOW (ref 101–111)
GFR calc Af Amer: >60 mL/min (ref >60)
Glucose, Bld: 122 mg/dL — ABNORMAL HIGH (ref 65–99)
POTASSIUM: 3.9 mmol/L (ref 3.5–5.1)
SODIUM: 129 mmol/L — AB (ref 135–145)

## 2016-08-25 LAB — PROTIME-INR
INR: 2.07
PROTHROMBIN TIME: 23.6 s — AB (ref 11.4–15.2)

## 2016-08-25 SURGERY — LOWER EXTREMITY ANGIOGRAPHY
Anesthesia: LOCAL

## 2016-08-25 MED ORDER — HEPARIN (PORCINE) IN NACL 2-0.9 UNIT/ML-% IJ SOLN
INTRAMUSCULAR | Status: AC
  Filled 2016-08-25: qty 500

## 2016-08-25 MED ORDER — HYDRALAZINE HCL 20 MG/ML IJ SOLN
5.0000 mg | INTRAMUSCULAR | Status: DC | PRN
  Filled 2016-08-25: qty 0.25

## 2016-08-25 MED ORDER — SODIUM CHLORIDE 0.9 % IV SOLN: 250.0000 mL | INTRAVENOUS | Status: DC | PRN

## 2016-08-25 MED ORDER — LABETALOL HCL 5 MG/ML IV SOLN
10.0000 mg | INTRAVENOUS | Status: DC | PRN
  Filled 2016-08-25: qty 4

## 2016-08-25 MED ORDER — SODIUM CHLORIDE 0.9% FLUSH
3.0000 mL | Freq: Two times a day (BID) | INTRAVENOUS | Status: DC
  Administered 2016-08-25 – 2016-09-16 (×36): 3 mL via INTRAVENOUS

## 2016-08-25 MED ORDER — SODIUM CHLORIDE 0.9 % WEIGHT BASED INFUSION: 1.0000 mL/kg/h | INTRAVENOUS | Status: AC

## 2016-08-25 MED ORDER — ONDANSETRON HCL 4 MG/2ML IJ SOLN: 4.0000 mg | Freq: Four times a day (QID) | INTRAMUSCULAR | Status: DC | PRN

## 2016-08-25 MED ORDER — HEPARIN (PORCINE) IN NACL 2-0.9 UNIT/ML-% IJ SOLN
INTRAMUSCULAR | Status: AC | PRN
  Administered 2016-08-25: 1000 mL

## 2016-08-25 MED ORDER — LIDOCAINE HCL (PF) 1 % IJ SOLN
INTRAMUSCULAR | Status: DC | PRN
  Administered 2016-08-25: 8 mL

## 2016-08-25 MED ORDER — IODIXANOL 320 MG/ML IV SOLN
INTRAVENOUS | Status: DC | PRN
  Administered 2016-08-25: 75 mL via INTRA_ARTERIAL

## 2016-08-25 MED ORDER — ACETAMINOPHEN 325 MG PO TABS: 650.0000 mg | ORAL_TABLET | ORAL | Status: DC | PRN

## 2016-08-25 MED ORDER — LIDOCAINE HCL (PF) 1 % IJ SOLN
INTRAMUSCULAR | Status: AC
  Filled 2016-08-25: qty 30

## 2016-08-25 MED ORDER — SODIUM CHLORIDE 0.9 % IV SOLN
INTRAVENOUS | Status: AC | PRN
  Administered 2016-08-25: 10 mL/h via INTRAVENOUS

## 2016-08-25 MED ORDER — SODIUM CHLORIDE 0.9% FLUSH
3.0000 mL | INTRAVENOUS | Status: DC | PRN
  Administered 2016-09-01 – 2016-09-09 (×3): 3 mL via INTRAVENOUS
  Filled 2016-08-25 (×3): qty 3

## 2016-08-25 SURGICAL SUPPLY — 13 items
CATH CROSS OVER TEMPO 5F (CATHETERS) ×2 IMPLANT
CATH OMNI FLUSH 5F 65CM (CATHETERS) ×2 IMPLANT
CATH STRAIGHT 5FR 65CM (CATHETERS) ×2 IMPLANT
COVER PRB 48X5XTLSCP FOLD TPE (BAG) ×1 IMPLANT
COVER PROBE 5X48 (BAG) ×1
GUIDEWIRE ANGLED .035X150CM (WIRE) ×2 IMPLANT
KIT MICROINTRODUCER STIFF 5F (SHEATH) ×2 IMPLANT
KIT PV (KITS) ×2 IMPLANT
SHEATH PINNACLE 5F 10CM (SHEATH) ×2 IMPLANT
SYR MEDRAD MARK V 150ML (SYRINGE) ×2 IMPLANT
TRANSDUCER W/STOPCOCK (MISCELLANEOUS) ×2 IMPLANT
TRAY PV CATH (CUSTOM PROCEDURE TRAY) ×2 IMPLANT
WIRE HITORQ VERSACORE ST 145CM (WIRE) ×2 IMPLANT

## 2016-08-25 NOTE — Progress Notes (Signed)
Progress Note  Patient Name: Edward Summers Date of Encounter: 08/25/2016  Primary Cardiologist: Dr. Debara Pickett  Subjective   Had coughing with hemoptysis yesterday. Eliquis stopped. Rest pain right foot, no CP  Inpatient Medications    Scheduled Meds: . amiodarone  200 mg Oral BID  . atorvastatin  40 mg Oral q1800  . dextromethorphan-guaiFENesin  1 tablet Oral BID  . diltiazem  10 mg Intravenous Once  . docusate sodium  100 mg Oral Daily  . feeding supplement  1 Container Oral TID BM  . levofloxacin  750 mg Oral Daily  . mometasone-formoterol  2 puff Inhalation BID  . pantoprazole  40 mg Oral Daily  . predniSONE  30 mg Oral Q breakfast   Continuous Infusions: . sodium chloride    . sodium chloride 125 mL/hr at 08/21/16 2045  . lactated ringers 10 mL/hr at 08/21/16 1709  . magnesium sulfate 1 - 4 g bolus IVPB     PRN Meds: sodium chloride, acetaminophen **OR** acetaminophen, alum & mag hydroxide-simeth, bisacodyl, guaiFENesin-dextromethorphan, hydrALAZINE, ipratropium, levalbuterol, magnesium sulfate 1 - 4 g bolus IVPB, metoprolol tartrate, ondansetron, oxyCODONE-acetaminophen, phenol, potassium chloride, senna-docusate   Vital Signs    Vitals:   08/25/16 0400 08/25/16 0555 08/25/16 0814 08/25/16 0839  BP: 117/77  126/77   Pulse: 67     Resp: 13     Temp:  98.2 F (36.8 C) 98.3 F (36.8 C)   TempSrc:  Oral Oral   SpO2: 93%   96%  Weight:      Height:        Intake/Output Summary (Last 24 hours) at 08/25/16 3716 Last data filed at 08/25/16 9678  Gross per 24 hour  Intake              150 ml  Output              675 ml  Net             -525 ml   Filed Weights   08/21/16 1716 08/21/16 2258  Weight: 138 lb (62.6 kg) 156 lb 14.4 oz (71.2 kg)    Telemetry    Now normal sinus rhythm, 60-80s - Personally Reviewed  ECG    Atrial fibrillation - Personally Reviewed  Physical Exam   GEN: Well nourished, well developed, in no acute distress  HEENT: normal    Neck: no JVD, carotid bruits, or masses Cardiac: RRR; no murmurs, rubs, or gallops,no edema  Respiratory:  clear to auscultation bilaterally, normal work of breathing GI: soft, nontender, nondistended, + BS MS: post surgical foot changes noted.  Skin: warm and dry, no rash Neuro:  Alert and Oriented x 3, Strength and sensation are intact Psych: euthymic mood, full affect    Labs    Chemistry Recent Labs Lab 08/21/16 1628  08/23/16 0120 08/24/16 0233 08/25/16 0128  NA 134*  < > 134* 131* 129*  K 4.3  < > 3.9 3.7 3.9  CL 101  < > 103 102 99*  CO2 24  < > 24 24 26   GLUCOSE 113*  < > 186* 104* 122*  BUN 12  < > 16 16 18   CREATININE 0.78  < > 0.73 0.71 0.71  CALCIUM 8.2*  < > 7.7* 7.5* 7.6*  PROT 5.5*  --   --   --   --   ALBUMIN 2.2*  --   --   --   --   AST 22  --   --   --   --  ALT 31  --   --   --   --   ALKPHOS 85  --   --   --   --   BILITOT 1.2  --   --   --   --   GFRNONAA >60  < > >60 >60 >60  GFRAA >60  < > >60 >60 >60  ANIONGAP 9  < > 7 5 4*  < > = values in this interval not displayed.   Hematology  Recent Labs Lab 08/24/16 0233 08/24/16 1619 08/25/16 0128  WBC 20.3* 27.5* 21.2*  RBC 3.50* 3.57* 3.38*  HGB 9.8* 10.0* 9.4*  HCT 29.8* 29.9* 28.1*  MCV 85.1 83.8 83.1  MCH 28.0 28.0 27.8  MCHC 32.9 33.4 33.5  RDW 16.4* 15.5 15.5  PLT 157 163 166    Cardiac Enzymes  Recent Labs Lab 08/21/16 2141 08/22/16 0225 08/22/16 1036  TROPONINI 0.03* 0.06* <0.03   No results for input(s): TROPIPOC in the last 168 hours.   BNP  Recent Labs Lab 08/21/16 1628 08/22/16 0225  BNP 53.8 202.3*     DDimer No results for input(s): DDIMER in the last 168 hours.   Radiology    No results found.  Cardiac Studies   Echocardiogram 08/1716 - Left ventricle: The cavity size was normal. Systolic function was   normal. The estimated ejection fraction was in the range of 60%   to 65%. Wall motion was normal; there were no regional wall   motion  abnormalities. - Aortic valve: Transvalvular velocity was within the normal range.   There was no stenosis. There was no regurgitation. - Mitral valve: Transvalvular velocity was within the normal range.   There was no evidence for stenosis. There was no regurgitation. - Right ventricle: The cavity size was normal. Wall thickness was   normal. Systolic function was normal. - Atrial septum: No defect or patent foramen ovale was identified   by color flow Doppler. - Tricuspid valve: There was no regurgitation.  Patient Profile     76 y.o. male with atrial fibrillation and rapid ventricular response in the setting of acute limb ischemia  Assessment & Plan    Paroxysmal atrial fibrillation  - Converted to sinus rhythm 3 days ago, 08/22/16 at around 6 PM.  - In the setting of acute limb ischemia  - Echocardiogram reassuring with normal EF.  - Amiodarone 200 mg twice a day. In 1 weeks change to 200 mg PO QD.   - Reevaluate need for amiodarone in approximately 4 weeks.  - Eliquis 5 mg twice a day was started on 8/18 (had only 3 doses prior to discontinuation yesterday - just barely at steady state). Stopped because of hemoptysis which resolved spontaneously. See Dr. Stephens Shire note.   - He was asymptomatic with his atrial fibrillation.  Chronic anticoagulation  - holding now. See Above.  Hemoptysis  - brief associated with cough. Hg stable.    Peripheral vascular disease  - Right lower extremity embolic phenomenon status post thrombectomy.  - Dr. Carloyn Jaeger note reviewed.   - Going for arteriogram for further evaluation.   Signed, Candee Furbish, MD  08/25/2016, 9:22 AM

## 2016-08-25 NOTE — Progress Notes (Signed)
Site area: left groin fa sheath pulled by LMurphy; pressure held by [facility] Prior to Removal:  Level 0 Pressure Applied For: 20 minutes Manual: yes   Patient Status During Pull:  stable Post Pull Site:  Level 0 Post Pull Instructions Given:  yes Post Pull Pulses Present: dopplered Dressing Applied:  Gauze and tegaderm Bedrest begins @ 1325 Comments:

## 2016-08-25 NOTE — Care Management Important Message (Signed)
Important Message  Patient Details  Name: Edward Summers MRN: [id number] Date of Birth: [birth year]   Medicare Important Message Given:  Yes    Nathen May 08/25/2016, 11:35 AM

## 2016-08-25 NOTE — Progress Notes (Signed)
PT Cancellation Note  Patient Details Name: CHANEY MACLAREN MRN: [id number] DOB: Nov 16, 1940   Cancelled Treatment:    Reason Eval/Treat Not Completed: Patient at procedure or test/unavailable.  Will return at later date.   Despina Pole 08/25/2016, 1:36 PM Carita Pian. Sanjuana Kava, [place] Pager [phone]

## 2016-08-25 NOTE — Op Note (Signed)
   PATIENT: Edward Summers      MRN: [id number] DOB: 02/25/1940    DATE OF PROCEDURE: 08/25/2016  INDICATIONS:    [name] is a 76 y.o. male who is undergone previous right popliteal and tibial embolectomy but had some persistent ischemia of his right foot. I was asked to perform arteriography to see if there were any further options for revascularization.  PROCEDURE:    1. Ultrasound-guided access to the left common femoral artery 2. Aortogram with bilateral iliac arteriogram 3. Selective catheterization of the right external iliac artery with right lower extremity runoff  SURGEON: Judeth Cornfield. Scot Dock, MD, FACS  ANESTHESIA: Local   EBL: Minimal  TECHNIQUE: The patient was taken to the peripheral vascular lab. Both groins were prepped and draped in usual sterile fashion. After the skin was anesthetized and function lidocaine, under ultrasound guidance, the left common femoral artery was cannulated with a micro-puncture needle and a micropunch sheath introduced over a wire. This was exchanged for a 5 French sheath over a versa core wire. Omni Flush catheter was positioned at the L1 vertebral body and flush aortogram obtained. The cath was in position above the aortic bifurcation and exchanged for a crossover catheter. This was positioned into the right common iliac artery. Wire was advanced into the common femoral artery and a crossover catheter exchanged for a straight catheter. Selective right external iliac arterial gram was obtained with right lower extremity runoff.  At the completion of the procedure, the catheter was removed. Patient was transferred to the holding area for removal of the sheath. No medial complications were noted.  FINDINGS:   1. There are single renal arteries bilaterally with no significant renal artery stenosis identified. 2. The infrarenal aorta is widely patent as are both common iliac arteries and external iliac arteries and hypogastric arteries. There  is a mild eccentric plaque in the right common iliac artery which is not flow limiting. 3. The right common femoral and deep femoral arteries are patent. The superficial femoral artery is patent with mild stenosis at the adductor canal. The popliteal artery is patent. There is single-vessel runoff via the posterior tibial artery which is occluded in the distal to mid calf. There are no named vessels that reconstitute distally. The anterior tibial and peroneal arteries are occluded.  CLINICAL NOTE: Based on these images I do not think there are any further options for revascularization.  Deitra Mayo, MD, FACS [facility]  DATE OF DICTATION:   08/25/2016

## 2016-08-25 NOTE — Progress Notes (Signed)
   VASCULAR SURGERY ASSESSMENT & PLAN:   4 Days Post-Op s/p: Thromboembolectomy right popliteal artery, posterior tibial artery, and anterior tibial artery, and administration of intravenous arterial TPA.  The patient continues to have rest pain of the right foot with some mottling on the plantar aspect of the foot. I have been asked to proceed with arteriography to see if there are any further options for revascularization. I have discussed the indications for the procedure with the patient and the potential complications. He is agreeable to proceed. If we find disease amenable to angioplasty then we could potentially proceed at the same time, however, more likely we will see if there is any options for a bypass.  The patient did receive a dose of Eliquis yesterday morning however I agree with Dr. Trula Slade that the increased risk of bleeding associated with proceeding with his arteriogram today outweighs the risk of waiting. I have again reviewed with the patient that this is potentially a limb threatening situation.  In reviewing the op note, the patient was noted to have a mix of chronic and acute clot in the tibial vessels. 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 It sounds like the saphenous vein was spared.  I will make further recommendations pending results of his arteriogram.   SUBJECTIVE:   Still with some rest pain in the right foot.  PHYSICAL EXAM:   Vitals:   08/24/16 2104 08/25/16 0000 08/25/16 0400 08/25/16 0555  BP:  123/63 117/77   Pulse:  67 67   Resp:  13 13   Temp: 98.2 F (36.8 C)   98.2 F (36.8 C)  TempSrc: Oral   Oral  SpO2:  91% 93%   Weight:      Height:       Right foot is slightly cool. Some mottling on the plantar aspect of the foot.  LABS:   Lab Results  Component Value Date   WBC 21.2 (H) 08/25/2016   HGB 9.4 (L) 08/25/2016   HCT 28.1 (L) 08/25/2016   MCV 83.1 08/25/2016   PLT 166 08/25/2016   Lab Results  Component  Value Date   CREATININE 0.71 08/25/2016   Lab Results  Component Value Date   INR 2.07 08/25/2016   CBG (last 3)  No results for input(s): GLUCAP in the last 72 hours.  PROBLEM LIST:    Principal Problem:   Ischemic pain of foot, right Active Problems:   New onset atrial fibrillation (HCC)   Atrial fibrillation with RVR (HCC)   HLD (hyperlipidemia)   Essential hypertension   COPD with acute exacerbation (HCC)   Ischemic foot   CURRENT MEDS:   . amiodarone  200 mg Oral BID  . atorvastatin  40 mg Oral q1800  . dextromethorphan-guaiFENesin  1 tablet Oral BID  . diltiazem  10 mg Intravenous Once  . docusate sodium  100 mg Oral Daily  . feeding supplement  1 Container Oral TID BM  . levofloxacin  750 mg Oral Daily  . mometasone-formoterol  2 puff Inhalation BID  . pantoprazole  40 mg Oral Daily  . predniSONE  30 mg Oral Q breakfast    Gae Gallop Beeper: [phone] Office: [phone] 08/25/2016

## 2016-08-25 NOTE — Progress Notes (Signed)
OT Cancellation Note  Patient Details Name: Edward Summers MRN: [id number] DOB: May 28, 1940   Cancelled Treatment:    Reason Eval/Treat Not Completed: Medical issues which prohibited therapy;Patient at procedure or test/ unavailable.  Pt undergoing arteriography earlier today, and now on post procedure bedrest.  Will check back.  Ponce, OTR/L [phone]   Lucille Passy M 08/25/2016, 5:39 PM

## 2016-08-26 NOTE — Progress Notes (Signed)
Physical Therapy Treatment Patient Details Name: Edward Summers MRN: [id number] DOB: [birth year] Today's Date: 08/26/2016    History of Present Illness 76 year old male with history of hypertension, hyperlipidemia, emphysema who was admitted by vascular surgery for acute right leg ischemia and underwent surgical thromboembolectomy of right popliteal, PT and AT arteries. Hospitalist was consulted because patient developed postprocedure atrial fibrillation with rapid ventricular rate and hypotension. Cardiology was also consulted for the same.    PMH:  HLD, HTN, COPD    PT Comments    Patient tolerated increased gait distance this session and reported that pain in R LE has improved today. Continue to progress as tolerated.    Follow Up Recommendations  Home health PT;Supervision for mobility/OOB     Equipment Recommendations  Rolling walker with 5" wheels    Recommendations for Other Services       Precautions / Restrictions Precautions Precautions: Fall Restrictions Weight Bearing Restrictions: No    Mobility  Bed Mobility Overal bed mobility: Modified Independent Bed Mobility: Supine to Sit;Sit to Supine           General bed mobility comments: increased time and effort  Transfers Overall transfer level: Needs assistance Equipment used: Rolling walker (2 wheeled) Transfers: Sit to/from Stand Sit to Stand: Mod assist;+2 safety/equipment         General transfer comment: cues for hand placement; assist to power up into standing with minimal weightbearing on R LE when coming into standing  Ambulation/Gait Ambulation/Gait assistance: Min assist;+2 safety/equipment Ambulation Distance (Feet): 65 Feet Assistive device: Rolling walker (2 wheeled) Gait Pattern/deviations: Step-to pattern;Decreased stance time - right;Decreased step length - left;Decreased weight shift to right;Antalgic     General Gait Details: cues for sequencing and technique to decrease weight on  R LE; 3 brief standing rest breaks due to SOB; cues for pursed lip breathing; vitals WNL    Stairs            Wheelchair Mobility    Modified Rankin (Stroke Patients Only)       Balance Overall balance assessment: Needs assistance Sitting-balance support: No upper extremity supported;Feet unsupported Sitting balance-Leahy Scale: Good     Standing balance support: Bilateral upper extremity supported Standing balance-Leahy Scale: Poor                              Cognition Arousal/Alertness: Awake/alert Behavior During Therapy: WFL for tasks assessed/performed Overall Cognitive Status: Within Functional Limits for tasks assessed                                        Exercises      General Comments        Pertinent Vitals/Pain Pain Assessment: Faces Faces Pain Scale: Hurts little more Pain Location: Rt LE with weightbearing Pain Descriptors / Indicators: Grimacing;Sore;Guarding Pain Intervention(s): Limited activity within patient's tolerance;Monitored during session;Premedicated before session;Repositioned    Home Living                      Prior Function            PT Goals (current goals can now be found in the care plan section) Acute Rehab PT Goals PT Goal Formulation: With patient Time For Goal Achievement: 08/29/16 Potential to Achieve Goals: Good Progress towards PT goals: Progressing toward goals    Frequency  Min 3X/week      PT Plan Current plan remains appropriate    Co-evaluation              AM-PAC PT "6 Clicks" Daily Activity  Outcome Measure  Difficulty turning over in bed (including adjusting bedclothes, sheets and blankets)?: None Difficulty moving from lying on back to sitting on the side of the bed? : Unable Difficulty sitting down on and standing up from a chair with arms (e.g., wheelchair, bedside commode, etc,.)?: Unable Help needed moving to and from a bed to chair  (including a wheelchair)?: A Little Help needed walking in hospital room?: A Lot Help needed climbing 3-5 steps with a railing? : A Lot 6 Click Score: 13    End of Session Equipment Utilized During Treatment: Gait belt Activity Tolerance: Patient tolerated treatment well Patient left: in bed;with call bell/phone within reach (With LE's elevated) Nurse Communication: Mobility status PT Visit Diagnosis: Unsteadiness on feet (R26.81);Muscle weakness (generalized) (M62.81);Difficulty in walking, not elsewhere classified (R26.2);Pain Pain - Right/Left: Right Pain - part of body: Leg;Ankle and joints of foot     Time: 3559-7416 PT Time Calculation (min) (ACUTE ONLY): 23 min  Charges:  $Gait Training: 8-22 mins $Therapeutic Activity: 8-22 mins                    G Codes:       Earney Navy, PTA Pager: [phone]     Darliss Cheney 08/26/2016, 4:28 PM

## 2016-08-26 NOTE — Progress Notes (Signed)
Progress Note  Patient Name: Edward Summers Date of Encounter: 08/26/2016  Primary Cardiologist: Dr. Debara Pickett  Subjective   Had coughing 2 days ago with mild hemoptysis. Spontaneously resolved. No further bleeding. Eliquis stopped. Rest pain right foot, no CP. Non weight bearing.   Inpatient Medications    Scheduled Meds: . amiodarone  200 mg Oral BID  . atorvastatin  40 mg Oral q1800  . dextromethorphan-guaiFENesin  1 tablet Oral BID  . diltiazem  10 mg Intravenous Once  . docusate sodium  100 mg Oral Daily  . feeding supplement  1 Container Oral TID BM  . levofloxacin  750 mg Oral Daily  . mometasone-formoterol  2 puff Inhalation BID  . pantoprazole  40 mg Oral Daily  . predniSONE  30 mg Oral Q breakfast  . sodium chloride flush  3 mL Intravenous Q12H   Continuous Infusions: . sodium chloride    . sodium chloride 125 mL/hr at 08/21/16 2045  . sodium chloride    . lactated ringers 10 mL/hr at 08/21/16 1709  . magnesium sulfate 1 - 4 g bolus IVPB     PRN Meds: sodium chloride, sodium chloride, acetaminophen **OR** acetaminophen, acetaminophen, alum & mag hydroxide-simeth, bisacodyl, guaiFENesin-dextromethorphan, hydrALAZINE, hydrALAZINE, ipratropium, labetalol, levalbuterol, magnesium sulfate 1 - 4 g bolus IVPB, metoprolol tartrate, ondansetron (ZOFRAN) IV, oxyCODONE-acetaminophen, phenol, potassium chloride, senna-docusate, sodium chloride flush   Vital Signs    Vitals:   08/26/16 0000 08/26/16 0013 08/26/16 0400 08/26/16 0747  BP: (!) 90/45 121/62 119/77   Pulse: 60 67 68 71  Resp:   16 19  Temp: 98.1 F (36.7 C)  98.4 F (36.9 C)   TempSrc: Oral  Oral   SpO2: 93% 90% 95% 94%  Weight:      Height:        Intake/Output Summary (Last 24 hours) at 08/26/16 1024 Last data filed at 08/26/16 0300  Gross per 24 hour  Intake              240 ml  Output                0 ml  Net              240 ml   Filed Weights   08/21/16 1716 08/21/16 2258  Weight: 138 lb  (62.6 kg) 156 lb 14.4 oz (71.2 kg)    Telemetry    NSR, 60-80s - Personally Reviewed  ECG    Atrial fibrillation - Personally Reviewed  Physical Exam   GEN: Well nourished, well developed, in no acute distress  HEENT: normal  Neck: no JVD, carotid bruits, or masses Cardiac: RRR; no murmurs, rubs, or gallops,no edema  Respiratory:  clear to auscultation bilaterally, normal work of breathing GI: soft, nontender, nondistended, + BS MS: right foot mottled Skin: warm and dry, no rash Neuro:  Alert and Oriented x 3 Psych: euthymic mood, full affect     Labs    Chemistry Recent Labs Lab 08/21/16 1628  08/23/16 0120 08/24/16 0233 08/25/16 0128  NA 134*  < > 134* 131* 129*  K 4.3  < > 3.9 3.7 3.9  CL 101  < > 103 102 99*  CO2 24  < > 24 24 26   GLUCOSE 113*  < > 186* 104* 122*  BUN 12  < > 16 16 18   CREATININE 0.78  < > 0.73 0.71 0.71  CALCIUM 8.2*  < > 7.7* 7.5* 7.6*  PROT 5.5*  --   --   --   --  ALBUMIN 2.2*  --   --   --   --   AST 22  --   --   --   --   ALT 31  --   --   --   --   ALKPHOS 85  --   --   --   --   BILITOT 1.2  --   --   --   --   GFRNONAA >60  < > >60 >60 >60  GFRAA >60  < > >60 >60 >60  ANIONGAP 9  < > 7 5 4*  < > = values in this interval not displayed.   Hematology  Recent Labs Lab 08/24/16 0233 08/24/16 1619 08/25/16 0128  WBC 20.3* 27.5* 21.2*  RBC 3.50* 3.57* 3.38*  HGB 9.8* 10.0* 9.4*  HCT 29.8* 29.9* 28.1*  MCV 85.1 83.8 83.1  MCH 28.0 28.0 27.8  MCHC 32.9 33.4 33.5  RDW 16.4* 15.5 15.5  PLT 157 163 166    Cardiac Enzymes  Recent Labs Lab 08/21/16 2141 08/22/16 0225 08/22/16 1036  TROPONINI 0.03* 0.06* <0.03   No results for input(s): TROPIPOC in the last 168 hours.   BNP  Recent Labs Lab 08/21/16 1628 08/22/16 0225  BNP 53.8 202.3*     DDimer No results for input(s): DDIMER in the last 168 hours.   Radiology    No results found.  Cardiac Studies   Echocardiogram 08/1716 - Left ventricle: The  cavity size was normal. Systolic function was   normal. The estimated ejection fraction was in the range of 60%   to 65%. Wall motion was normal; there were no regional wall   motion abnormalities. - Aortic valve: Transvalvular velocity was within the normal range.   There was no stenosis. There was no regurgitation. - Mitral valve: Transvalvular velocity was within the normal range.   There was no evidence for stenosis. There was no regurgitation. - Right ventricle: The cavity size was normal. Wall thickness was   normal. Systolic function was normal. - Atrial septum: No defect or patent foramen ovale was identified   by color flow Doppler. - Tricuspid valve: There was no regurgitation.  Patient Profile     76 y.o. male with atrial fibrillation and rapid ventricular response in the setting of acute limb ischemia  Assessment & Plan    Paroxysmal atrial fibrillation  - Converted to sinus rhythm 08/22/16 at around 6 PM.  - In the setting of acute limb ischemia  - Echocardiogram reassuring with normal EF.  - Amiodarone 200 mg twice a day. 4 days change to 200 mg PO QD.   - Reevaluate need for amiodarone in approximately 4 weeks.  - Eliquis 5 mg twice a day was started on 8/18 (had only 3 doses prior to discontinuation - just barely at steady state). Stopped because of hemoptysis which resolved spontaneously. See Dr. Stephens Shire note.   - He was asymptomatic with his atrial fibrillation.  Chronic anticoagulation  - holding now. See Above.  - possible BKA Thursday   Hemoptysis  - brief associated with cough. Hg stable.   - no further  Peripheral vascular disease   - amputation appears inevitable, possibly Thursday  Signed, Candee Furbish, MD  08/26/2016, 10:24 AM

## 2016-08-26 NOTE — Progress Notes (Signed)
Notified MD of inability to obtain Posterior Tibial pulse foot with increased mottling and coolness to touch. Will continue to monitor.

## 2016-08-26 NOTE — Progress Notes (Signed)
Progress Note  SUBJECTIVE:    Can't put weight on right foot. Shooting pain in right foot is tolerable.   OBJECTIVE:   Vitals:   08/26/16 0400 08/26/16 0747  BP: 119/77   Pulse: 68   Resp: 16   Temp: 98.4 F (36.9 C)   SpO2: 95% 94%    Intake/Output Summary (Last 24 hours) at 08/26/16 0748 Last data filed at 08/26/16 0300  Gross per 24 hour  Intake              240 ml  Output                0 ml  Net              240 ml   Right foot is cold and mottled up to above right ankle. Right calf is warm and non tender.  Able to move right toes. Minimal sensation right foot. Brisk PT signal 3 cm proximal to medial malleolus.  Incisions clean and intact. Left groin without hematoma.    ASSESSMENT/PLAN:   76 y.o. male is s/p: thromboembolictomy right popliteal, PT and AT arteries (08/21/16) Right lower extremity arteriogram 1 Day Post-Op   No further options for revascularization right leg. Single vessel runoff via posterior tibial which occludes in the distal to mid-calf.  Right foot is ischemic with diminished sensory function. Has some motor function. Currently able to tolerate pain and does not want amputation.   PAF: remains NSR. On amiodarone currently. Eliquis held due to arteriogram yesterday and prior episode of hemoptysis.   Dr. Donzetta Matters to see.   Alvia Grove 08/26/2016 7:48 AM -- LABS:   CBC    Component Value Date/Time   WBC 21.2 (H) 08/25/2016 0128   HGB 9.4 (L) 08/25/2016 0128   HCT 28.1 (L) 08/25/2016 0128   PLT 166 08/25/2016 0128    BMET    Component Value Date/Time   NA 129 (L) 08/25/2016 0128   K 3.9 08/25/2016 0128   CL 99 (L) 08/25/2016 0128   CO2 26 08/25/2016 0128   GLUCOSE 122 (H) 08/25/2016 0128   BUN 18 08/25/2016 0128   CREATININE 0.71 08/25/2016 0128   CALCIUM 7.6 (L) 08/25/2016 0128   GFRNONAA >60 08/25/2016 0128   GFRAA >60 08/25/2016 0128    COAG Lab Results  Component Value Date   INR 2.07 08/25/2016   INR 1.14  08/21/2016   No results found for: PTT  ANTIBIOTICS:   Anti-infectives    Start     Dose/Rate Route Frequency Ordered Stop   08/23/16 1345  levofloxacin (LEVAQUIN) tablet 750 mg     750 mg Oral Daily 08/23/16 1344 08/28/16 0959   08/21/16 2359  cefUROXime (ZINACEF) 1.5 g in dextrose 5 % 50 mL IVPB     1.5 g 100 mL/hr over 30 Minutes Intravenous Every 12 hours 08/21/16 2254 08/22/16 1202   08/21/16 2300  doxycycline (VIBRA-TABS) tablet 100 mg  Status:  Discontinued     100 mg Oral Every 12 hours 08/21/16 2132 08/23/16 Ubly, PA-C [facility] Office: [phone] Pager: [phone] 08/26/2016 7:48 AM   I have independently interviewed patient and agree with PA assessment and plan above. Discussed below knee amputation and patient seems more amenable now but wants to talk to family first. Hold anticoagulation for possible amputation on Thursday.   Benjimin Hadden C. Donzetta Matters, MD [facility] [place] Office: [phone] Pager: [phone]

## 2016-08-27 ENCOUNTER — Telehealth: Payer: Self-pay | Admitting: Vascular Surgery

## 2016-08-27 MED ORDER — CEFAZOLIN SODIUM-DEXTROSE 1-4 GM/50ML-% IV SOLN
1.0000 g | INTRAVENOUS | Status: AC
  Administered 2016-08-28: 1 g via INTRAVENOUS
  Filled 2016-08-27 (×2): qty 50

## 2016-08-27 NOTE — Progress Notes (Addendum)
  Progress Note  SUBJECTIVE:    Still with pain right foot.   OBJECTIVE:   Vitals:   08/27/16 0809 08/27/16 0822  BP: 125/69   Pulse: 69   Resp: 18   Temp: 98.2 F (36.8 C)   SpO2: 93% 98%    Intake/Output Summary (Last 24 hours) at 08/27/16 1035 Last data filed at 08/27/16 0800  Gross per 24 hour  Intake              240 ml  Output              300 ml  Net              -60 ml   Right foot is cool and mottled. It is warm at the right ankle. No tenderness right calf. Brisk PT doppler signal.  Incisions clean and intact.   ASSESSMENT/PLAN:   76 y.o. male is s/p: thromboembolictomy right popliteal, PT and AT arteries (08/21/16) Right lower extremity arteriogram 2 Days Post-Op   Right foot remains ischemic. Patient considering right below knee amputation but still wants to discuss with family.  Continue to hold anticoagulation in anticipation of surgery tomorrow.   Alvia Grove 08/27/2016 10:35 AM -- LABS:   CBC    Component Value Date/Time   WBC 21.2 (H) 08/25/2016 0128   HGB 9.4 (L) 08/25/2016 0128   HCT 28.1 (L) 08/25/2016 0128   PLT 166 08/25/2016 0128    BMET    Component Value Date/Time   NA 129 (L) 08/25/2016 0128   K 3.9 08/25/2016 0128   CL 99 (L) 08/25/2016 0128   CO2 26 08/25/2016 0128   GLUCOSE 122 (H) 08/25/2016 0128   BUN 18 08/25/2016 0128   CREATININE 0.71 08/25/2016 0128   CALCIUM 7.6 (L) 08/25/2016 0128   GFRNONAA >60 08/25/2016 0128   GFRAA >60 08/25/2016 0128    COAG Lab Results  Component Value Date   INR 2.07 08/25/2016   INR 1.14 08/21/2016   No results found for: PTT  ANTIBIOTICS:   Anti-infectives    Start     Dose/Rate Route Frequency Ordered Stop   08/23/16 1345  levofloxacin (LEVAQUIN) tablet 750 mg     750 mg Oral Daily 08/23/16 1344 08/28/16 0959   08/21/16 2359  cefUROXime (ZINACEF) 1.5 g in dextrose 5 % 50 mL IVPB     1.5 g 100 mL/hr over 30 Minutes Intravenous Every 12 hours 08/21/16 2254 08/22/16 1202     08/21/16 2300  doxycycline (VIBRA-TABS) tablet 100 mg  Status:  Discontinued     100 mg Oral Every 12 hours 08/21/16 2132 08/23/16 [place], PA-C [facility] Office: [phone] Pager: [phone] 08/27/2016 10:35 AM  I have independently interviewed and examined patient and agree with PA assessment and plan above. Will tentatively plan for below knee amputation tomorrow.   Brighten Orndoff C. Donzetta Matters, MD [facility] [place] Office: [phone] Pager: [phone]

## 2016-08-27 NOTE — Progress Notes (Signed)
Physical Therapy Treatment Patient Details Name: Edward Summers MRN: [id number] DOB: [birth year] Today's Date: 08/27/2016    History of Present Illness 76 year old male with history of hypertension, hyperlipidemia, emphysema who was admitted by vascular surgery for acute right leg ischemia and underwent surgical thromboembolectomy of right popliteal, PT and AT arteries. Hospitalist was consulted because patient developed postprocedure atrial fibrillation with rapid ventricular rate and hypotension. Cardiology was also consulted for the same.    PMH:  HLD, HTN, COPD    PT Comments    Pt is progressing towards goals and is currently minA for bed mobility,  modAx2 for transfers and minA for ambulation of 75 feet with close chair follow. Pt initially experienced some L LE weakness with gait that improved with ambulation. Pt requires skilled PT to progress mobility and to improve LE strength and endurance to safely mobilize in his discharge environment.    Follow Up Recommendations  Home health PT;Supervision for mobility/OOB     Equipment Recommendations  Rolling walker with 5" wheels       Precautions / Restrictions Precautions Precautions: Fall Restrictions Weight Bearing Restrictions: No    Mobility  Bed Mobility Overal bed mobility: Needs Assistance Bed Mobility: Supine to Sit     Supine to sit: Min assist     General bed mobility comments: minA for pad scoot to EoB secondary to new pressure injuries on bottom  Transfers Overall transfer level: Needs assistance Equipment used: Rolling walker (2 wheeled) Transfers: Sit to/from Stand Sit to Stand: Mod assist;+2 safety/equipment         General transfer comment: cues for hand placement; assist to power up into standing, L knee buckling at upright requiring modA to steady  Ambulation/Gait Ambulation/Gait assistance: Min assist;+2 safety/equipment   Assistive device: Rolling walker (2 wheeled) Gait Pattern/deviations:  Decreased stance time - right;Decreased step length - left;Antalgic;Step-through pattern     General Gait Details: cues for sequencing and technique to decrease weight on R LE; DoE with ambulation, vc for deeper breathing, vitals WNL          Balance Overall balance assessment: Needs assistance Sitting-balance support: No upper extremity supported;Feet unsupported Sitting balance-Leahy Scale: Good     Standing balance support: Bilateral upper extremity supported Standing balance-Leahy Scale: Poor Standing balance comment: RW for support                            Cognition Arousal/Alertness: Awake/alert Behavior During Therapy: WFL for tasks assessed/performed Overall Cognitive Status: Within Functional Limits for tasks assessed                                           General Comments General comments (skin integrity, edema, etc.): VSS      Pertinent Vitals/Pain Pain Assessment: 0-10 Pain Score: 5  Pain Location: Rt LE with weightbearing Pain Descriptors / Indicators: Grimacing;Sore;Guarding Pain Intervention(s): Monitored during session;Limited activity within patient's tolerance           PT Goals (current goals can now be found in the care plan section) Acute Rehab PT Goals PT Goal Formulation: With patient Time For Goal Achievement: 08/29/16 Potential to Achieve Goals: Good Progress towards PT goals: Progressing toward goals    Frequency    Min 3X/week      PT Plan Current plan remains appropriate  AM-PAC PT "6 Clicks" Daily Activity  Outcome Measure  Difficulty turning over in bed (including adjusting bedclothes, sheets and blankets)?: None Difficulty moving from lying on back to sitting on the side of the bed? : Unable Difficulty sitting down on and standing up from a chair with arms (e.g., wheelchair, bedside commode, etc,.)?: Unable Help needed moving to and from a bed to chair (including a wheelchair)?: A  Little Help needed walking in hospital room?: A Lot Help needed climbing 3-5 steps with a railing? : A Lot 6 Click Score: 13    End of Session Equipment Utilized During Treatment: Gait belt Activity Tolerance: Patient tolerated treatment well Patient left: in bed;in chair;with chair alarm set;with call bell/phone within reach (With LE's elevated) Nurse Communication: Mobility status PT Visit Diagnosis: Unsteadiness on feet (R26.81);Muscle weakness (generalized) (M62.81);Difficulty in walking, not elsewhere classified (R26.2);Pain Pain - Right/Left: Right Pain - part of body: Leg;Ankle and joints of foot     Time: 1530-1550 PT Time Calculation (min) (ACUTE ONLY): 20 min  Charges:  $Gait Training: 8-22 mins                    G Codes:       Catilyn Boggus B. Migdalia Dk PT, DPT Acute Rehabilitation  [phone] Pager [phone]     [place] 08/27/2016, 4:05 PM

## 2016-08-27 NOTE — Telephone Encounter (Signed)
-----   Message from Mena Goes, RN sent at 08/27/2016 12:30 PM EDT ----- Regarding: 4-6 weeks w/ duplex   ----- Message ----- From: Ansel Bong Sent: 08/27/2016  10:27 AM To: Vvs Charge Pool  S/p left radial-cephalic AV fistula 1/74/71  F/u with Dr. Oneida Alar in 4-6 weeks with duplex  Thanks Maudie Mercury

## 2016-08-27 NOTE — Progress Notes (Signed)
Progress Note  Patient Name: Edward Summers Date of Encounter: 08/27/2016  Primary Cardiologist: Dr. Debara Pickett  Subjective   No SOB, no CP  Inpatient Medications    Scheduled Meds: . amiodarone  200 mg Oral BID  . atorvastatin  40 mg Oral q1800  . dextromethorphan-guaiFENesin  1 tablet Oral BID  . diltiazem  10 mg Intravenous Once  . docusate sodium  100 mg Oral Daily  . feeding supplement  1 Container Oral TID BM  . levofloxacin  750 mg Oral Daily  . mometasone-formoterol  2 puff Inhalation BID  . pantoprazole  40 mg Oral Daily  . predniSONE  30 mg Oral Q breakfast  . sodium chloride flush  3 mL Intravenous Q12H   Continuous Infusions: . sodium chloride    . sodium chloride 125 mL/hr at 08/21/16 2045  . sodium chloride    . lactated ringers 10 mL/hr at 08/21/16 1709  . magnesium sulfate 1 - 4 g bolus IVPB     PRN Meds: sodium chloride, sodium chloride, acetaminophen **OR** acetaminophen, acetaminophen, alum & mag hydroxide-simeth, bisacodyl, guaiFENesin-dextromethorphan, hydrALAZINE, hydrALAZINE, ipratropium, labetalol, levalbuterol, magnesium sulfate 1 - 4 g bolus IVPB, metoprolol tartrate, ondansetron (ZOFRAN) IV, oxyCODONE-acetaminophen, phenol, potassium chloride, senna-docusate, sodium chloride flush   Vital Signs    Vitals:   08/27/16 0017 08/27/16 0428 08/27/16 0809 08/27/16 0822  BP: 131/66 127/80 125/69   Pulse:  74 69   Resp:   18   Temp: 98 F (36.7 C) 97.8 F (36.6 C) 98.2 F (36.8 C)   TempSrc: Oral Oral Oral   SpO2:   93% 98%  Weight:      Height:        Intake/Output Summary (Last 24 hours) at 08/27/16 5732 Last data filed at 08/27/16 0800  Gross per 24 hour  Intake              240 ml  Output              300 ml  Net              -60 ml   Filed Weights   08/21/16 1716 08/21/16 2258  Weight: 138 lb (62.6 kg) 156 lb 14.4 oz (71.2 kg)    Telemetry    NSR, 60-80s stable- Personally Reviewed  ECG    Atrial fibrillation - Personally  Reviewed  Physical Exam   GEN: Well nourished, well developed, in no acute distress  HEENT: normal  Neck: no JVD, carotid bruits, or masses Cardiac: RRR; no murmurs, rubs, or gallops,no edema  Respiratory:  clear to auscultation bilaterally, normal work of breathing GI: soft, nontender, nondistended, + BS MS: cool right foot Skin: cool foot Neuro:  Alert and Oriented x 3, decreased right foot sensation Psych: euthymic mood, full affect      Labs    Chemistry Recent Labs Lab 08/21/16 1628  08/23/16 0120 08/24/16 0233 08/25/16 0128  NA 134*  < > 134* 131* 129*  K 4.3  < > 3.9 3.7 3.9  CL 101  < > 103 102 99*  CO2 24  < > 24 24 26   GLUCOSE 113*  < > 186* 104* 122*  BUN 12  < > 16 16 18   CREATININE 0.78  < > 0.73 0.71 0.71  CALCIUM 8.2*  < > 7.7* 7.5* 7.6*  PROT 5.5*  --   --   --   --   ALBUMIN 2.2*  --   --   --   --  AST 22  --   --   --   --   ALT 31  --   --   --   --   ALKPHOS 85  --   --   --   --   BILITOT 1.2  --   --   --   --   GFRNONAA >60  < > >60 >60 >60  GFRAA >60  < > >60 >60 >60  ANIONGAP 9  < > 7 5 4*  < > = values in this interval not displayed.   Hematology  Recent Labs Lab 08/24/16 0233 08/24/16 1619 08/25/16 0128  WBC 20.3* 27.5* 21.2*  RBC 3.50* 3.57* 3.38*  HGB 9.8* 10.0* 9.4*  HCT 29.8* 29.9* 28.1*  MCV 85.1 83.8 83.1  MCH 28.0 28.0 27.8  MCHC 32.9 33.4 33.5  RDW 16.4* 15.5 15.5  PLT 157 163 166    Cardiac Enzymes  Recent Labs Lab 08/21/16 2141 08/22/16 0225 08/22/16 1036  TROPONINI 0.03* 0.06* <0.03   No results for input(s): TROPIPOC in the last 168 hours.   BNP  Recent Labs Lab 08/21/16 1628 08/22/16 0225  BNP 53.8 202.3*     DDimer No results for input(s): DDIMER in the last 168 hours.   Radiology    No results found.  Cardiac Studies   Echocardiogram 08/1716 - Left ventricle: The cavity size was normal. Systolic function was   normal. The estimated ejection fraction was in the range of 60%   to  65%. Wall motion was normal; there were no regional wall   motion abnormalities. - Aortic valve: Transvalvular velocity was within the normal range.   There was no stenosis. There was no regurgitation. - Mitral valve: Transvalvular velocity was within the normal range.   There was no evidence for stenosis. There was no regurgitation. - Right ventricle: The cavity size was normal. Wall thickness was   normal. Systolic function was normal. - Atrial septum: No defect or patent foramen ovale was identified   by color flow Doppler. - Tricuspid valve: There was no regurgitation.  Patient Profile     76 y.o. male with atrial fibrillation and rapid ventricular response in the setting of acute limb ischemia  Assessment & Plan    Paroxysmal atrial fibrillation  - Converted to sinus rhythm 08/22/16 at around 6 PM.  - In the setting of acute limb ischemia  - Echocardiogram reassuring with normal EF.  - Amiodarone 200 mg twice a day. 3 days change to 200 mg PO QD.   - Reevaluate need for amiodarone in approximately 4 weeks.  - Eliquis 5 mg twice a day was started on 8/18 (had only 3 doses prior to discontinuation - just barely at steady state). Stopped because of hemoptysis which resolved spontaneously. See Dr. Stephens Shire note.   - He was asymptomatic with his atrial fibrillation.  - currently stable  Chronic anticoagulation  - holding now. See Above.  - possible BKA tomorrow   Hemoptysis  - brief associated with cough. Hg stable.   - no further occurrence   Peripheral vascular disease   - amputation appears inevitable, possibly Thursday  Signed, Candee Furbish, MD  08/27/2016, 8:28 AM

## 2016-08-27 NOTE — Telephone Encounter (Signed)
Sched lab 09/25/16 at 4:00 and MD 10/02/16 at 11:00. Spoke to significant other, she was very confused and did not know what I was talking about. Lm on son's # to call us back.

## 2016-08-28 ENCOUNTER — Encounter (HOSPITAL_COMMUNITY)
Admission: EM | Disposition: A | Discharge status: Skilled Nursing Facility | Payer: Self-pay | Source: Home / Self Care | Attending: Vascular Surgery

## 2016-08-28 ENCOUNTER — Inpatient Hospital Stay (HOSPITAL_COMMUNITY): Payer: Medicare HMO | Admitting: Anesthesiology

## 2016-08-28 ENCOUNTER — Encounter (HOSPITAL_COMMUNITY): Payer: Self-pay | Admitting: *Deleted

## 2016-08-28 HISTORY — PX: AMPUTATION: SHX166

## 2016-08-28 LAB — BASIC METABOLIC PANEL
Anion gap: 4 — ABNORMAL LOW (ref 5–15)
BUN: 9 mg/dL (ref 6–20)
CHLORIDE: 101 mmol/L (ref 101–111)
CO2: 25 mmol/L (ref 22–32)
Calcium: 7.4 mg/dL — ABNORMAL LOW (ref 8.9–10.3)
Creatinine, Ser: 0.63 mg/dL (ref 0.61–1.24)
GFR calc Af Amer: >60 mL/min (ref >60)
GFR calc non Af Amer: >60 mL/min (ref >60)
Glucose, Bld: 93 mg/dL (ref 65–99)
POTASSIUM: 3.5 mmol/L (ref 3.5–5.1)
SODIUM: 130 mmol/L — AB (ref 135–145)

## 2016-08-28 LAB — CBC
HEMATOCRIT: 27.9 % — AB (ref 39.0–52.0)
HEMOGLOBIN: 9.3 g/dL — AB (ref 13.0–17.0)
MCH: 28.5 pg (ref 26.0–34.0)
MCHC: 33.3 g/dL (ref 30.0–36.0)
MCV: 85.6 fL (ref 78.0–100.0)
PLATELETS: 159 10*3/uL (ref 150–400)
RBC: 3.26 MIL/uL — AB (ref 4.22–5.81)
RDW: 17.1 % — ABNORMAL HIGH (ref 11.5–15.5)
WBC: 17.4 10*3/uL — AB (ref 4.0–10.5)

## 2016-08-28 LAB — MRSA PCR SCREENING: MRSA by PCR: NEGATIVE

## 2016-08-28 SURGERY — AMPUTATION BELOW KNEE
Anesthesia: General | Laterality: Right

## 2016-08-28 MED ORDER — PROMETHAZINE HCL 25 MG/ML IJ SOLN: 6.2500 mg | INTRAMUSCULAR | Status: DC | PRN

## 2016-08-28 MED ORDER — ONDANSETRON HCL 4 MG/2ML IJ SOLN
INTRAMUSCULAR | Status: DC | PRN
  Administered 2016-08-28: 4 mg via INTRAVENOUS

## 2016-08-28 MED ORDER — 0.9 % SODIUM CHLORIDE (POUR BTL) OPTIME
TOPICAL | Status: DC | PRN
  Administered 2016-08-28: 1000 mL

## 2016-08-28 MED ORDER — HYDROMORPHONE HCL 1 MG/ML IJ SOLN: 0.2500 mg | INTRAMUSCULAR | Status: DC | PRN

## 2016-08-28 MED ORDER — FENTANYL CITRATE (PF) 100 MCG/2ML IJ SOLN
INTRAMUSCULAR | Status: DC | PRN
  Administered 2016-08-28 (×4): 50 ug via INTRAVENOUS

## 2016-08-28 MED ORDER — EPHEDRINE SULFATE 50 MG/ML IJ SOLN
INTRAMUSCULAR | Status: DC | PRN
  Administered 2016-08-28: 10 mg via INTRAVENOUS

## 2016-08-28 MED ORDER — MIDAZOLAM HCL 2 MG/2ML IJ SOLN
INTRAMUSCULAR | Status: AC
  Filled 2016-08-28: qty 2

## 2016-08-28 MED ORDER — FENTANYL CITRATE (PF) 250 MCG/5ML IJ SOLN
INTRAMUSCULAR | Status: AC
  Filled 2016-08-28: qty 5

## 2016-08-28 MED ORDER — LACTATED RINGERS IV SOLN
INTRAVENOUS | Status: DC
  Administered 2016-08-28: 11:00:00 via INTRAVENOUS

## 2016-08-28 MED ORDER — MIDAZOLAM HCL 5 MG/5ML IJ SOLN
INTRAMUSCULAR | Status: DC | PRN
  Administered 2016-08-28: 1 mg via INTRAVENOUS

## 2016-08-28 MED ORDER — SODIUM CHLORIDE 0.9 % IV SOLN
INTRAVENOUS | Status: DC | PRN
  Administered 2016-08-28: 80 ug via INTRAVENOUS
  Administered 2016-08-28: 120 ug via INTRAVENOUS

## 2016-08-28 MED ORDER — PROPOFOL 10 MG/ML IV BOLUS
INTRAVENOUS | Status: DC | PRN
  Administered 2016-08-28: 110 mg via INTRAVENOUS

## 2016-08-28 MED ORDER — LIDOCAINE HCL (CARDIAC) 20 MG/ML IV SOLN
INTRAVENOUS | Status: DC | PRN
  Administered 2016-08-28: 100 mg via INTRAVENOUS

## 2016-08-28 SURGICAL SUPPLY — 51 items
BANDAGE ACE 4X5 VEL STRL LF (GAUZE/BANDAGES/DRESSINGS) ×3 IMPLANT
BANDAGE ACE 6X5 VEL STRL LF (GAUZE/BANDAGES/DRESSINGS) IMPLANT
BANDAGE ESMARK 6X9 LF (GAUZE/BANDAGES/DRESSINGS) IMPLANT
BLADE SAW GIGLI 510 (BLADE) ×2 IMPLANT
BLADE SAW GIGLI 510MM (BLADE) ×1
BNDG COHESIVE 6X5 TAN STRL LF (GAUZE/BANDAGES/DRESSINGS) IMPLANT
BNDG ESMARK 6X9 LF (GAUZE/BANDAGES/DRESSINGS)
BNDG GAUZE ELAST 4 BULKY (GAUZE/BANDAGES/DRESSINGS) ×3 IMPLANT
CANISTER SUCT 3000ML PPV (MISCELLANEOUS) ×3 IMPLANT
CLIP VESOCCLUDE MED 6/CT (CLIP) ×3 IMPLANT
COVER SURGICAL LIGHT HANDLE (MISCELLANEOUS) ×3 IMPLANT
CUFF TOURNIQUET SINGLE 34IN LL (TOURNIQUET CUFF) IMPLANT
CUFF TOURNIQUET SINGLE 44IN (TOURNIQUET CUFF) IMPLANT
DRAIN CHANNEL 19F RND (DRAIN) IMPLANT
DRAPE HALF SHEET 40X57 (DRAPES) ×3 IMPLANT
DRAPE ORTHO SPLIT 77X108 STRL (DRAPES) ×4
DRAPE SURG ORHT 6 SPLT 77X108 (DRAPES) ×2 IMPLANT
DRSG ADAPTIC 3X8 NADH LF (GAUZE/BANDAGES/DRESSINGS) ×3 IMPLANT
ELECT REM PT RETURN 9FT ADLT (ELECTROSURGICAL) ×3
ELECTRODE REM PT RTRN 9FT ADLT (ELECTROSURGICAL) ×1 IMPLANT
EVACUATOR SILICONE 100CC (DRAIN) IMPLANT
GAUZE SPONGE 4X4 12PLY STRL (GAUZE/BANDAGES/DRESSINGS) IMPLANT
GAUZE SPONGE 4X4 12PLY STRL LF (GAUZE/BANDAGES/DRESSINGS) ×3 IMPLANT
GLOVE BIO SURGEON STRL SZ7.5 (GLOVE) ×3 IMPLANT
GLOVE BIOGEL PI IND STRL 6.5 (GLOVE) ×1 IMPLANT
GLOVE BIOGEL PI IND STRL 7.0 (GLOVE) ×2 IMPLANT
GLOVE BIOGEL PI INDICATOR 6.5 (GLOVE) ×2
GLOVE BIOGEL PI INDICATOR 7.0 (GLOVE) ×4
GOWN STRL REUS W/ TWL LRG LVL3 (GOWN DISPOSABLE) ×2 IMPLANT
GOWN STRL REUS W/ TWL XL LVL3 (GOWN DISPOSABLE) ×1 IMPLANT
GOWN STRL REUS W/TWL LRG LVL3 (GOWN DISPOSABLE) ×4
GOWN STRL REUS W/TWL XL LVL3 (GOWN DISPOSABLE) ×2
KIT BASIN OR (CUSTOM PROCEDURE TRAY) ×3 IMPLANT
KIT ROOM TURNOVER OR (KITS) ×3 IMPLANT
NS IRRIG 1000ML POUR BTL (IV SOLUTION) ×3 IMPLANT
PACK GENERAL/GYN (CUSTOM PROCEDURE TRAY) ×3 IMPLANT
PAD ARMBOARD 7.5X6 YLW CONV (MISCELLANEOUS) ×6 IMPLANT
STAPLER VISISTAT 35W (STAPLE) ×3 IMPLANT
STOCKINETTE IMPERVIOUS LG (DRAPES) ×3 IMPLANT
SUT BONE WAX W31G (SUTURE) IMPLANT
SUT ETHILON 3 0 PS 1 (SUTURE) IMPLANT
SUT SILK 0 TIES 10X30 (SUTURE) ×3 IMPLANT
SUT SILK 2 0 (SUTURE) ×2
SUT SILK 2 0 SH CR/8 (SUTURE) ×3 IMPLANT
SUT SILK 2-0 18XBRD TIE 12 (SUTURE) ×1 IMPLANT
SUT SILK 3 0 (SUTURE)
SUT SILK 3-0 18XBRD TIE 12 (SUTURE) IMPLANT
SUT VIC AB 2-0 CT1 18 (SUTURE) ×6 IMPLANT
TOWEL GREEN STERILE (TOWEL DISPOSABLE) ×3 IMPLANT
UNDERPAD 30X30 (UNDERPADS AND DIAPERS) ×3 IMPLANT
WATER STERILE IRR 1000ML POUR (IV SOLUTION) ×3 IMPLANT

## 2016-08-28 NOTE — Op Note (Signed)
    Patient name: BERTIL BRICKEY MRN: [id number] DOB: December 03, 1940 Sex: male   08/28/2016 Pre-operative Diagnosis: critical limb ischemia right lower extremity Post-operative diagnosis:  Same Surgeon:  Erlene Quan C. Donzetta Matters, MD Assistant: Silva Bandy, PA Procedure Performed: Right below knee amputation  Indications:  76 year old male recently underwent right lower extremity thromboembolectomy for acute ischemia. Postoperatively he had marginal blood flow to his right foot and underwent angiography which demonstrated cut off the mid calf with no flow to the foot at all. He has progressive remodeling of his right foot with sensory changes and not likely to have a functional lower extremity. He is therefore indicated for right below-knee amputation to which she has agreed and we have discussed risks benefits and alternatives.  Findings: There was adequate bleeding and implantation bed for healing. There was acute-appearing thrombus in his tibial vessels. The flap supposed well   Procedure:  The patient was identified in the holding area and taken to the operating room and was placed supine on the operating table and general anesthesia was induced. He was given antibiotics and sterilely prepped and draped in the right lower extremity for amputation. Timeout was called. We began by marking out a two thirds one third amputation incorporating his previous below-knee incision. The leg was then exsanguinated with Esmarch and tourniquet inflated. Total tourniquet time was 6 minutes. We then traced our previously marked incision. We carried this down to the level of the tibia with cautery and divided the fascia laterally as well as posteriorly. We then divided the tibia with Gigli saw after elevating the periosteum with anterior bevel. We then transected the fibula. Posterior flap was created with amputation knife. Visible vessels were clamped and the tourniquet was allowed down. We ligated our tibial vessels with silk  ties. The nerve was pulled on tension and ligated with Vicryl tie and divided. The wound was irrigated and hemostasis obtained. Rasp was used to smooth the bone edges. We had to remove excess muscle as well as excess skin that we had attempted to preserve to be in line with his previous incision. Hemostasis was also obtained and these wound beds. We then reapproximated the flaps with 2-0 Vicryl suture. Skin was reapproximated with skin clips. Dry dressings were placed. Patient was then awakened from anesthesia having tolerated procedure well. All counts were correct at completion.  Blood loss: 50cc  Tula Schryver C. Donzetta Matters, MD [facility] [place] Office: [phone] Pager: [phone]

## 2016-08-28 NOTE — Anesthesia Preprocedure Evaluation (Signed)
Anesthesia Evaluation  Patient identified by MRN, date of birth, ID band Patient awake    Reviewed: Allergy & Precautions, H&P , NPO status , Patient's Chart, lab work & pertinent test results  Airway Mallampati: II  TM Distance: >3 FB Neck ROM: Full    Dental no notable dental hx. (+) Edentulous Upper, Dental Advisory Given, Partial Lower   Pulmonary COPD, former smoker,    Pulmonary exam normal breath sounds clear to auscultation       Cardiovascular hypertension, negative cardio ROS   Rhythm:Regular Rate:Normal     Neuro/Psych negative neurological ROS  negative psych ROS   GI/Hepatic negative GI ROS, Neg liver ROS,   Endo/Other  negative endocrine ROS  Renal/GU negative Renal ROS  negative genitourinary   Musculoskeletal   Abdominal   Peds  Hematology negative hematology ROS (+)   Anesthesia Other Findings   Reproductive/Obstetrics negative OB ROS                             Anesthesia Physical  Anesthesia Plan  ASA: III and emergent  Anesthesia Plan: General   Post-op Pain Management:    Induction: Intravenous  PONV Risk Score and Plan: 3 and Ondansetron, Dexamethasone and Midazolam  Airway Management Planned: LMA  Additional Equipment:   Intra-op Plan:   Post-operative Plan: Extubation in OR  Informed Consent: I have reviewed the patients History and Physical, chart, labs and discussed the procedure including the risks, benefits and alternatives for the proposed anesthesia with the patient or authorized representative who has indicated his/her understanding and acceptance.   Dental advisory given  Plan Discussed with: CRNA and Anesthesiologist  Anesthesia Plan Comments:         Anesthesia Quick Evaluation

## 2016-08-28 NOTE — Progress Notes (Signed)
  Progress Note    08/28/2016 8:22 AM 3 Days Post-Op  Subjective:  No acute issues  Vitals:   08/28/16 0739 08/28/16 0813  BP: 120/82   Pulse: 80   Resp: 16   Temp: 98 F (36.7 C)   SpO2: 94% 94%    Physical Exam: aaox3 Right peroneal and pt signals to mid calf No signals in foot Progressive mottling of right foot, motor is in tact but sensation out past mid foot  CBC    Component Value Date/Time   WBC 17.4 (H) 08/28/2016 0320   RBC 3.26 (L) 08/28/2016 0320   HGB 9.3 (L) 08/28/2016 0320   HCT 27.9 (L) 08/28/2016 0320   PLT 159 08/28/2016 0320   MCV 85.6 08/28/2016 0320   MCH 28.5 08/28/2016 0320   MCHC 33.3 08/28/2016 0320   RDW 17.1 (H) 08/28/2016 0320   LYMPHSABS 2.0 08/23/2016 0120   MONOABS 1.1 (H) 08/23/2016 0120   EOSABS 0.0 08/23/2016 0120   BASOSABS 0.0 08/23/2016 0120    BMET    Component Value Date/Time   NA 130 (L) 08/28/2016 0320   K 3.5 08/28/2016 0320   CL 101 08/28/2016 0320   CO2 25 08/28/2016 0320   GLUCOSE 93 08/28/2016 0320   BUN 9 08/28/2016 0320   CREATININE 0.63 08/28/2016 0320   CALCIUM 7.4 (L) 08/28/2016 0320   GFRNONAA >60 08/28/2016 0320   GFRAA >60 08/28/2016 0320    INR    Component Value Date/Time   INR 2.07 08/25/2016 0128     Intake/Output Summary (Last 24 hours) at 08/28/16 0822 Last data filed at 08/28/16 1610  Gross per 24 hour  Intake              480 ml  Output             1190 ml  Net             -710 ml     Assessment:  76 y.o. male is s/p attempted limb salvage with thromboembolectomy of rle.   Plan: OR today for right bka We have discussed risks involved specifically non healing, benefits including getting fitted for prosthetic earlier and alternatives of not proceeding. He agrees to right bka today.   Aslyn Cottman C. Donzetta Matters, MD [facility] [place] Office: [phone] Pager: [phone]  08/28/2016 8:22 AM

## 2016-08-28 NOTE — Care Management Note (Signed)
Case Management Note Marvetta Gibbons RN, BSN Unit 4E-Case Manager [phone]  Patient Details  Name: Edward Summers MRN: [id number] Date of Birth: 08/23/1940  Subjective/Objective:    Pt admitted with ischemic foot pain- s/p embolectomy                 Action/Plan: PTA pt lived at home, Per PT eval recommendations for [facility]- CM to follow  Expected Discharge Date:                  Expected Discharge Plan:  Skilled Nursing Facility  In-House Referral:  Clinical Social Work  Discharge planning Services  CM Consult  Post Acute Care Choice:    Choice offered to:     DME Arranged:    DME Agency:     HH Arranged:    [place] Agency:     Status of Service:  In process, will continue to follow  If discussed at Long Length of Stay Meetings, dates discussed:  8/21, 8/23  Discharge Disposition:   Additional Comments:  08/28/16- 1620- Marvetta Gibbons RN, CM- post op pt continued to have rest pain- s/p BKA today- will need repeat PT/OT evals- post BKA to determine best d/c plan for pt- pt most likely will need rehab ?SNF- CM will f/u post op  Dawayne Patricia, RN 08/28/2016, 4:26 PM

## 2016-08-28 NOTE — Anesthesia Postprocedure Evaluation (Signed)
Anesthesia Post Note  Patient: Edward Summers  Procedure(s) Performed: Procedure(s) (LRB): AMPUTATION BELOW KNEE RIGHT (Right)     Patient location during evaluation: PACU Anesthesia Type: General Level of consciousness: sedated Pain management: pain level controlled Vital Signs Assessment: post-procedure vital signs reviewed and stable Respiratory status: spontaneous breathing and respiratory function stable Cardiovascular status: stable Anesthetic complications: no    Last Vitals:  Vitals:   08/28/16 1410 08/28/16 1414  BP: 128/63   Pulse: 73 72  Resp: 14 16  Temp:  (!) 36.1 C  SpO2: 97% 97%    Last Pain:  Vitals:   08/28/16 1414  TempSrc:   PainSc: 0-No pain                 [name]

## 2016-08-28 NOTE — Transfer of Care (Signed)
Immediate Anesthesia Transfer of Care Note  Patient: Edward Summers  Procedure(s) Performed: Procedure(s): AMPUTATION BELOW KNEE RIGHT (Right)  Patient Location: PACU  Anesthesia Type:General  Level of Consciousness: awake, alert  and oriented  Airway & Oxygen Therapy: Patient Spontanous Breathing and Patient connected to nasal cannula oxygen  Post-op Assessment: Report given to RN and Post -op Vital signs reviewed and stable  Post vital signs: Reviewed and stable  Last Vitals:  Vitals:   08/28/16 0813 08/28/16 1339  BP:  130/71  Pulse:  76  Resp:  12  Temp:  (!) 36.3 C  SpO2: 94% 98%    Last Pain:  Vitals:   08/28/16 1339  TempSrc:   PainSc: 0-No pain      Patients Stated Pain Goal: 0 (35/52/17 4715)  Complications: No apparent anesthesia complications

## 2016-08-28 NOTE — Anesthesia Procedure Notes (Signed)
Procedure Name: LMA Insertion Date/Time: 08/28/2016 12:18 PM Performed by: Lavell Luster Pre-anesthesia Checklist: Patient identified, Emergency Drugs available, Suction available, Patient being monitored and Timeout performed Patient Re-evaluated:Patient Re-evaluated prior to induction Oxygen Delivery Method: Circle system utilized Preoxygenation: Pre-oxygenation with 100% oxygen Induction Type: IV induction Ventilation: Mask ventilation without difficulty LMA: LMA inserted LMA Size: 4.0 Number of attempts: 1 Placement Confirmation: positive ETCO2 and breath sounds checked- equal and bilateral Tube secured with: Tape Dental Injury: Teeth and Oropharynx as per pre-operative assessment

## 2016-08-28 NOTE — Progress Notes (Signed)
Progress Note  Patient Name: Edward Summers Date of Encounter: 08/28/2016  Primary Cardiologist: Dr. Debara Pickett  Subjective   Nervous about BKA. No CP, no SOB. Spoke with son.  Inpatient Medications    Scheduled Meds: . amiodarone  200 mg Oral BID  . atorvastatin  40 mg Oral q1800  . dextromethorphan-guaiFENesin  1 tablet Oral BID  . diltiazem  10 mg Intravenous Once  . docusate sodium  100 mg Oral Daily  . feeding supplement  1 Container Oral TID BM  . mometasone-formoterol  2 puff Inhalation BID  . pantoprazole  40 mg Oral Daily  . predniSONE  30 mg Oral Q breakfast  . sodium chloride flush  3 mL Intravenous Q12H   Continuous Infusions: . sodium chloride    . sodium chloride 125 mL/hr at 08/21/16 2045  . sodium chloride    .  ceFAZolin (ANCEF) IV    . lactated ringers 10 mL/hr at 08/21/16 1709  . magnesium sulfate 1 - 4 g bolus IVPB     PRN Meds: sodium chloride, sodium chloride, acetaminophen **OR** acetaminophen, acetaminophen, alum & mag hydroxide-simeth, bisacodyl, guaiFENesin-dextromethorphan, hydrALAZINE, hydrALAZINE, ipratropium, labetalol, levalbuterol, magnesium sulfate 1 - 4 g bolus IVPB, metoprolol tartrate, ondansetron (ZOFRAN) IV, oxyCODONE-acetaminophen, phenol, potassium chloride, senna-docusate, sodium chloride flush   Vital Signs    Vitals:   08/28/16 0348 08/28/16 0400 08/28/16 0739 08/28/16 0813  BP: 130/70  120/82   Pulse:  (!) 58 80   Resp:  13 16   Temp: 97.8 F (36.6 C)  98 F (36.7 C)   TempSrc: Oral  Oral   SpO2:  92% 94% 94%  Weight:      Height:        Intake/Output Summary (Last 24 hours) at 08/28/16 1012 Last data filed at 08/28/16 4656  Gross per 24 hour  Intake              240 ml  Output             1090 ml  Net             -850 ml   Filed Weights   08/21/16 1716 08/21/16 2258  Weight: 138 lb (62.6 kg) 156 lb 14.4 oz (71.2 kg)    Telemetry    NSR, 60-80s stable- Personally Reviewed  ECG    Atrial fibrillation -  Personally Reviewed  Physical Exam   GEN: Well nourished, well developed, in no acute distress  HEENT: normal  Neck: no JVD, carotid bruits, or masses Cardiac: RRR; no murmurs, rubs, or gallops,no edema  Respiratory:  clear to auscultation bilaterally, normal work of breathing GI: soft, nontender, nondistended, + BS MS: no deformity or atrophy  Skin: right foot mottled.  Neuro:  Alert and Oriented x 3, Strength and sensation are intact Psych: euthymic mood, full affect       Labs    Chemistry Recent Labs Lab 08/21/16 1628  08/24/16 0233 08/25/16 0128 08/28/16 0320  NA 134*  < > 131* 129* 130*  K 4.3  < > 3.7 3.9 3.5  CL 101  < > 102 99* 101  CO2 24  < > 24 26 25   GLUCOSE 113*  < > 104* 122* 93  BUN 12  < > 16 18 9   CREATININE 0.78  < > 0.71 0.71 0.63  CALCIUM 8.2*  < > 7.5* 7.6* 7.4*  PROT 5.5*  --   --   --   --  ALBUMIN 2.2*  --   --   --   --   AST 22  --   --   --   --   ALT 31  --   --   --   --   ALKPHOS 85  --   --   --   --   BILITOT 1.2  --   --   --   --   GFRNONAA >60  < > >60 >60 >60  GFRAA >60  < > >60 >60 >60  ANIONGAP 9  < > 5 4* 4*  < > = values in this interval not displayed.   Hematology  Recent Labs Lab 08/24/16 1619 08/25/16 0128 08/28/16 0320  WBC 27.5* 21.2* 17.4*  RBC 3.57* 3.38* 3.26*  HGB 10.0* 9.4* 9.3*  HCT 29.9* 28.1* 27.9*  MCV 83.8 83.1 85.6  MCH 28.0 27.8 28.5  MCHC 33.4 33.5 33.3  RDW 15.5 15.5 17.1*  PLT 163 166 159    Cardiac Enzymes  Recent Labs Lab 08/21/16 2141 08/22/16 0225 08/22/16 1036  TROPONINI 0.03* 0.06* <0.03   No results for input(s): TROPIPOC in the last 168 hours.   BNP  Recent Labs Lab 08/21/16 1628 08/22/16 0225  BNP 53.8 202.3*     DDimer No results for input(s): DDIMER in the last 168 hours.   Radiology    No results found.  Cardiac Studies   Echocardiogram 08/1716 - Left ventricle: The cavity size was normal. Systolic function was   normal. The estimated ejection  fraction was in the range of 60%   to 65%. Wall motion was normal; there were no regional wall   motion abnormalities. - Aortic valve: Transvalvular velocity was within the normal range.   There was no stenosis. There was no regurgitation. - Mitral valve: Transvalvular velocity was within the normal range.   There was no evidence for stenosis. There was no regurgitation. - Right ventricle: The cavity size was normal. Wall thickness was   normal. Systolic function was normal. - Atrial septum: No defect or patent foramen ovale was identified   by color flow Doppler. - Tricuspid valve: There was no regurgitation.  Patient Profile     76 y.o. male with atrial fibrillation and rapid ventricular response in the setting of acute limb ischemia  Assessment & Plan    Paroxysmal atrial fibrillation  - Converted to sinus rhythm 08/22/16 at around 6 PM.  - In the setting of acute limb ischemia  - Echocardiogram reassuring with normal EF.  - Amiodarone 200 mg twice a day. 2 days change to 200 mg PO QD.   - Reevaluate need for amiodarone in approximately 4 weeks.  - Eliquis 5 mg twice a day was started on 8/18 (had only 3 doses prior to discontinuation - just barely at steady state). Stopped because of hemoptysis which resolved spontaneously. See Dr. Stephens Shire note.   - He was asymptomatic with his atrial fibrillation.  - stable  Chronic anticoagulation  - holding now. See Above.  - possible BKA tomorrow   Hemoptysis  - brief associated with cough. Hg stable.   - no further occurrence   Peripheral vascular disease   - amputation/ BKA today  Signed, Candee Furbish, MD  08/28/2016, 10:12 AM

## 2016-08-29 ENCOUNTER — Encounter (HOSPITAL_COMMUNITY): Payer: Self-pay | Admitting: Vascular Surgery

## 2016-08-29 DIAGNOSIS — Z89511 Acquired absence of right leg below knee: Secondary | ICD-10-CM

## 2016-08-29 LAB — HEPARIN LEVEL (UNFRACTIONATED): Heparin Unfractionated: 0.3 IU/mL (ref 0.30–0.70)

## 2016-08-29 MED ORDER — SODIUM CHLORIDE 0.9 % IV BOLUS (SEPSIS)
500.0000 mL | Freq: Once | INTRAVENOUS | Status: AC
  Administered 2016-08-29: 500 mL via INTRAVENOUS

## 2016-08-29 MED ORDER — DILTIAZEM HCL 25 MG/5ML IV SOLN
10.0000 mg | INTRAVENOUS | Status: AC
  Administered 2016-08-29: 10 mg via INTRAVENOUS
  Filled 2016-08-29: qty 5

## 2016-08-29 MED ORDER — HEPARIN (PORCINE) IN NACL 100-0.45 UNIT/ML-% IJ SOLN
1250.0000 [IU]/h | INTRAMUSCULAR | Status: DC
  Administered 2016-08-31 (×2): 1250 [IU]/h via INTRAVENOUS
  Filled 2016-08-29 (×3): qty 250

## 2016-08-29 MED ORDER — HEPARIN (PORCINE) IN NACL 100-0.45 UNIT/ML-% IJ SOLN
1200.0000 [IU]/h | INTRAMUSCULAR | Status: DC
  Administered 2016-08-29: 1200 [IU]/h via INTRAVENOUS
  Filled 2016-08-29: qty 250

## 2016-08-29 NOTE — Progress Notes (Signed)
ANTICOAGULATION CONSULT NOTE - Follow Up Consult  Pharmacy Consult for Heparin Indication: atrial fibrillation  Allergies  Allergen Reactions  . Zithromax [Azithromycin] Other (See Comments)    Side pain    Patient Measurements: Height: 5\' 7"  (170.2 cm) Weight: 156 lb (70.8 kg) IBW/kg (Calculated) : 66.1  Vital Signs: Temp: 98.2 F (36.8 C) (08/24 1144) Temp Source: Oral (08/24 1144) BP: 98/68 (08/24 1144) Pulse Rate: 67 (08/24 1144)  Labs:  Recent Labs  08/28/16 0320 08/29/16 1640  HGB 9.3*  --   HCT 27.9*  --   PLT 159  --   HEPARINUNFRC  --  0.30  CREATININE 0.63  --     Estimated Creatinine Clearance: 75.7 mL/min (by C-G formula based on SCr of 0.63 mg/dL).   Medications:  Heparin @ 1200 units/hr  Assessment: 75yom POD #1 BKA was resumed on heparin for afib (eliquis pta). Initial heparin level is therapeutic on the low end of goal at 0.30.  Goal of Therapy:  Heparin level 0.3-0.7 units/ml Monitor platelets by anticoagulation protocol: Yes   Plan:  1) Increase heparin slightly to 1250 units/hr 2) Follow up daily heparin level and CBC  Deboraha Sprang 08/29/2016,5:58 PM

## 2016-08-29 NOTE — Progress Notes (Signed)
On call physician called again that pain HR continues to be elevated and Bp systolic in the 90'Z after 10mg  of Cardizem.

## 2016-08-29 NOTE — Consult Note (Signed)
Physical Medicine and Rehabilitation Consult Reason for Consult: Decreased functional mobility after right BKA Referring Physician: Dr. Donzetta Matters   HPI: Edward Summers is a 76 y.o. right handed male with history of hypertension and previous tobacco abuse, peripheral vascular disease. Per chart review patient lives with a male companion. Reported to be independent prior to admission. Good support at home. One level home with 3 steps to entry. Presented 08/21/2016 with increasing right lower extremity leg swelling 2 weeks with rest foot pain. Underwent right lower extremity angiogram findings of occlusion with thrombectomy of right popliteal artery 08/21/2016. Hospital course pain management. Ongoing ischemic changes of right lower extremity. 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 Donzetta Matters. Postoperative atrial fibrillation and placed on intravenous Lopressor transition to amiodarone with Cardizem. Acute blood loss anemia 9.3 and monitored as well as leukocytosis 17,400. Physical and occupational therapy evaluations pending. M.D. has requested physical medicine rehabilitation consult.   Review of Systems  Constitutional: Negative for chills and fever.  HENT: Negative for hearing loss.   Eyes: Negative for blurred vision and double vision.  Respiratory: Positive for cough and shortness of breath.   Cardiovascular: Positive for leg swelling. Negative for chest pain and palpitations.  Gastrointestinal: Positive for constipation. Negative for nausea and vomiting.  Genitourinary: Positive for urgency. Negative for dysuria, flank pain and hematuria.  Musculoskeletal: Positive for joint pain and myalgias.  Skin: Negative for rash.  Neurological: Positive for weakness. Negative for seizures.  All other systems reviewed and are negative.  Past Medical History:  Diagnosis Date  . Arthritis    "probably; get sore all over" (08/21/2016)  . Dyspnea   . Emphysema of lung ([place])    "from smoking"  (08/21/2016)  . History of gout X 1  . HLD (hyperlipidemia)    [name] 08/21/2016  . Hypertension   . Pneumonia 1970s   Past Surgical History:  Procedure Laterality Date  . AMPUTATION Right 08/28/2016   Procedure: AMPUTATION BELOW KNEE RIGHT;  Surgeon: Waynetta Sandy, MD;  Location: [facility];  Service: Vascular;  Laterality: Right;  . BACK SURGERY    . CATARACT EXTRACTION W/ INTRAOCULAR LENS IMPLANT Right   . EMBOLECTOMY Right 08/21/2016   Procedure: Right Lower Extremity Thromboembolectomy with Injection of Alteplase into Right Anterior Tibial Artery and Right Anterior Peroneal Artery;  Surgeon: Waynetta Sandy, MD;  Location: [facility];  Service: Vascular;  Laterality: Right;  . LOWER EXTREMITY ANGIOGRAM Right 08/21/2016   [name] 08/21/2016  . LOWER EXTREMITY ANGIOGRAM Right 08/21/2016   Procedure: LOWER EXTREMITY ANGIOGRAM;  Surgeon: Waynetta Sandy, MD;  Location: [facility];  Service: Vascular;  Laterality: Right;  . LOWER EXTREMITY ANGIOGRAPHY N/A 08/25/2016   Procedure: Lower Extremity Angiography;  Surgeon: Angelia Mould, MD;  Location: [facility];  Service: Cardiovascular;  Laterality: N/A;  . LUMBAR DISC SURGERY     Dr. Arnoldo Morale  . THROMBECTOMY Right 08/21/2016   Thromboembolectomy of right popliteal, pt and AT arteries/notes 08/21/2016  . TONSILLECTOMY     History reviewed. No pertinent family history. Social History:  reports that he quit smoking about 7 weeks ago. His smoking use included Cigarettes. He has a 85.50 pack-year smoking history. He has never used smokeless tobacco. He reports that he drinks alcohol. He reports that he does not use drugs. Allergies:  Allergies  Allergen Reactions  . Zithromax [Azithromycin] Other (See Comments)    Side pain   Medications Prior to Admission  Medication Sig Dispense Refill  .  atorvastatin (LIPITOR) 40 MG tablet Take 40 mg by mouth at bedtime.     Marland Kitchen lisinopril (PRINIVIL,ZESTRIL) 10 MG tablet Take  10 mg by mouth at bedtime.     . predniSONE (DELTASONE) 10 MG tablet Take 10-30 mg by mouth See admin instructions. Take 30 mg by mouth daily for 3 days, take 20 mg by mouth daily for 3 days, then take 10 mg by mouth daily for 3 days.      Home: Home Living Family/patient expects to be discharged to:: Private residence Living Arrangements: Spouse/significant other Available Help at Discharge: Family, Friend(s), Available 24 hours/day Type of Home: House Home Access: Stairs to enter Technical brewer of Steps: 3 Entrance Stairs-Rails: Right Home Layout: One level Bathroom Shower/Tub: Tub/shower unit, Architectural technologist: Standard Home Equipment: Cane - single point, Wheelchair - manual  Functional History: Prior Function Level of Independence: Independent with assistive device(s) Comments: Uses cane due to pain.  Takes sponge baths. Functional Status:  Mobility: Bed Mobility Overal bed mobility: Needs Assistance Bed Mobility: Supine to Sit Supine to sit: Min assist Sit to supine: Min assist General bed mobility comments: minA for pad scoot to EoB secondary to new pressure injuries on bottom Transfers Overall transfer level: Needs assistance Equipment used: Rolling walker (2 wheeled) Transfers: Sit to/from Stand Sit to Stand: Mod assist, +2 safety/equipment General transfer comment: cues for hand placement; assist to power up into standing, L knee buckling at upright requiring modA to steady Ambulation/Gait Ambulation/Gait assistance: Min assist, +2 safety/equipment Ambulation Distance (Feet): 65 Feet Assistive device: Rolling walker (2 wheeled) Gait Pattern/deviations: Decreased stance time - right, Decreased step length - left, Antalgic, Step-through pattern General Gait Details: cues for sequencing and technique to decrease weight on R LE; DoE with ambulation, vc for deeper breathing, vitals WNL     ADL: ADL Overall ADL's : Needs  assistance/impaired Eating/Feeding: Set up, Sitting Grooming: Set up, Sitting Upper Body Bathing: Set up, Sitting Lower Body Bathing: Moderate assistance, Sit to/from stand Upper Body Dressing : Set up, Sitting Lower Body Dressing: Maximal assistance, Sit to/from stand Lower Body Dressing Details (indicate cue type and reason): to don R sock Toilet Transfer: Minimal assistance, Stand-pivot, RW Toilet Transfer Details (indicate cue type and reason): simulated by sit to stand from chair with few steps forward/backward Functional mobility during ADLs: Minimal assistance, Rolling walker General ADL Comments: limited mobility due to pain and weakness  Cognition: Cognition Overall Cognitive Status: Within Functional Limits for tasks assessed Orientation Level: Oriented X4 Cognition Arousal/Alertness: Awake/alert Behavior During Therapy: WFL for tasks assessed/performed Overall Cognitive Status: Within Functional Limits for tasks assessed  Blood pressure (!) 100/56, pulse 63, temperature 98.3 F (36.8 C), temperature source Oral, resp. rate 15, height 5\' 7"  (1.702 m), weight 70.8 kg (156 lb), SpO2 94 %. Physical Exam  Vitals reviewed. Constitutional: He is oriented to person, place, and time. He appears well-developed.  HENT:  Head: Normocephalic.  Eyes: EOM are normal.  Neck: Normal range of motion. Neck supple. No thyromegaly present.  Cardiovascular:  Cardiac rate controlled  Respiratory: Effort normal and breath sounds normal.  GI: Soft. Bowel sounds are normal. He exhibits no distension.  Musculoskeletal:  Right leg wrapped. Cannot fully extend. Stump tender. Left foot with 2+ edema  Neurological: He is alert and oriented to person, place, and time.  Motor 5/5 UE and 4/5 LLE, RLE limited due to pain. Sensation decreased to LT in left foot  Skin:  Right BKA is dressed appropriately tender  Psychiatric: He has a normal mood and affect. His behavior is normal. Thought content  normal.    No results found for this or any previous visit (from the past 24 hour(s)). No results found.  Assessment/Plan: Diagnosis: Right BKA 1. Does the need for close, 24 hr/day medical supervision in concert with the patient's rehab needs make it unreasonable for this patient to be served in a less intensive setting? Yes 2. Co-Morbidities requiring supervision/potential complications: HTN, COPD/exercise tolerance, Afib,  3. Due to bladder management, bowel management, safety, skin/wound care, disease management, medication administration, pain management and patient education, does the patient require 24 hr/day rehab nursing? Yes 4. Does the patient require coordinated care of a physician, rehab nurse, PT (1-2 hrs/day, 5 days/week) and OT (1-2 hrs/day, 5 days/week) to address physical and functional deficits in the context of the above medical diagnosis(es)? Yes Addressing deficits in the following areas: balance, endurance, locomotion, strength, transferring, bowel/bladder control, bathing, dressing, feeding, grooming and toileting 5. Can the patient actively participate in an intensive therapy program of at least 3 hrs of therapy per day at least 5 days per week? Yes 6. The potential for patient to make measurable gains while on inpatient rehab is excellent 7. Anticipated functional outcomes upon discharge from inpatient rehab are modified independent  with PT, modified independent and supervision with OT, n/a with SLP. 8. Estimated rehab length of stay to reach the above functional goals is: 8-12 days potentially 9. Anticipated D/C setting: Home 10. Anticipated post D/C treatments: HH therapy and Outpatient therapy 11. Overall Rehab/Functional Prognosis: excellent  RECOMMENDATIONS: This patient's condition is appropriate for continued rehabilitative care in the following setting: CIR Patient has agreed to participate in recommended program. Yes Note that insurance prior authorization  may be required for reimbursement for recommended care.  Comment: Therapy post-op assessments pending. Rehab Admissions Coordinator to follow up.  Thanks,  Meredith Staggers, MD, Mellody Drown    Cathlyn Parsons., PA-C 08/29/2016

## 2016-08-29 NOTE — Progress Notes (Signed)
Cardiologist paged again concerning patient increased HR and RRT paged. Order for 500 Bolus and Cardizem 10mg  ordered

## 2016-08-29 NOTE — Progress Notes (Addendum)
  Progress Note  SUBJECTIVE:    Pain is a little better today.  OBJECTIVE:   Vitals:   08/29/16 0432 08/29/16 0445  BP: (!) 100/56   Pulse: (!) 124 63  Resp: 13 15  Temp: 98.3 F (36.8 C)   SpO2: 93% 94%    Intake/Output Summary (Last 24 hours) at 08/29/16 0750 Last data filed at 08/29/16 0511  Gross per 24 hour  Intake             1540 ml  Output              640 ml  Net              900 ml   RRR, lungs clear Right BKA dressing clean  ASSESSMENT/PLAN:   76 y.o. male is s/p: right below knee amputation 1 Day Post-Op   Went into afib with rvr early this am. Received 500 cc fluid bolus then 10 mg IV cardizem. Back in NSR this am around 0450. Remains in NSR rate 60s.  PT/OT today.  CIR consult pending.  Dressing down tomorrow.   Alvia Grove 08/29/2016 7:50 AM -- LABS:   CBC    Component Value Date/Time   WBC 17.4 (H) 08/28/2016 0320   HGB 9.3 (L) 08/28/2016 0320   HCT 27.9 (L) 08/28/2016 0320   PLT 159 08/28/2016 0320    BMET    Component Value Date/Time   NA 130 (L) 08/28/2016 0320   K 3.5 08/28/2016 0320   CL 101 08/28/2016 0320   CO2 25 08/28/2016 0320   GLUCOSE 93 08/28/2016 0320   BUN 9 08/28/2016 0320   CREATININE 0.63 08/28/2016 0320   CALCIUM 7.4 (L) 08/28/2016 0320   GFRNONAA >60 08/28/2016 0320   GFRAA >60 08/28/2016 0320    COAG Lab Results  Component Value Date   INR 2.07 08/25/2016   INR 1.14 08/21/2016   No results found for: PTT  ANTIBIOTICS:   Anti-infectives    Start     Dose/Rate Route Frequency Ordered Stop   08/28/16 0000  ceFAZolin (ANCEF) IVPB 1 g/50 mL premix    Comments:  Send with pt to OR   1 g 100 mL/hr over 30 Minutes Intravenous On call 08/27/16 1039 08/28/16 1258   08/23/16 1345  levofloxacin (LEVAQUIN) tablet 750 mg     750 mg Oral Daily 08/23/16 1344 08/28/16 0959   08/21/16 2359  cefUROXime (ZINACEF) 1.5 g in dextrose 5 % 50 mL IVPB     1.5 g 100 mL/hr over 30 Minutes Intravenous Every 12 hours  08/21/16 2254 08/22/16 1202   08/21/16 2300  doxycycline (VIBRA-TABS) tablet 100 mg  Status:  Discontinued     100 mg Oral Every 12 hours 08/21/16 2132 08/23/16 [place], PA-C [facility] Office: [phone] Pager: [phone] 08/29/2016 7:50 AM   I have independently interviewed and examined patient and agree with PA assessment and plan above. CIR evaluation. [place] for anticoagulation, heparin drip ordered. Dressing down tomorrow.   Brandon C. Donzetta Matters, MD [facility] Office: [phone] Pager: [phone]

## 2016-08-29 NOTE — Progress Notes (Signed)
Patient HR converted to NSR. Rate in the 60's. Patient asleep.

## 2016-08-29 NOTE — Progress Notes (Addendum)
Called by RN, patient is in AFIB RVR, RN already paged and spoke with the CARDS MD.  Patient received 5mg  IV Lopressor at [place], blood pressures soft after lopressor administration, RN spoke with CARDS MD, received orders for 500cc bolus and if BP improves, Cardizem 10mg  IV.  Orders were received prior to my arrival, 500cc bolus given, SBP upper 80- upper 90s, MAP > 60, patient asymptomatic of HR and BP, warm, good capillary refill, alert, no complaints of pain or distress. Lung sounds clear, sats 90-94% on RA (history of COPD).   I paged the CARDS MD a [phone], call back [phone], updated MD, will give Cardizem 10mg  IV Push.   Cardizem 10mg  IV given at 332, over 6 minutes, HR improved somewhat now 110s-130s, patient still asymptomatic, BPs remained the same. Primary RN updated CARDS MD, no new interventions for now.  Call RR RN if needed.  Start Time 238 End Time 350

## 2016-08-29 NOTE — Progress Notes (Signed)
Progress Note  Patient Name: Edward Summers Date of Encounter: 08/29/2016  Primary Cardiologist: Dr. Debara Pickett  Subjective   Status post BKA right. Pain improved. Brief atrial fibrillation episode postoperatively. No chest pain, no shortness of breath.  Inpatient Medications    Scheduled Meds: . amiodarone  200 mg Oral BID  . atorvastatin  40 mg Oral q1800  . dextromethorphan-guaiFENesin  1 tablet Oral BID  . docusate sodium  100 mg Oral Daily  . feeding supplement  1 Container Oral TID BM  . mometasone-formoterol  2 puff Inhalation BID  . pantoprazole  40 mg Oral Daily  . predniSONE  30 mg Oral Q breakfast  . sodium chloride flush  3 mL Intravenous Q12H   Continuous Infusions: . sodium chloride    . sodium chloride 125 mL/hr at 08/21/16 2045  . sodium chloride    . heparin 1,200 Units/hr (08/29/16 0840)  . lactated ringers 10 mL/hr at 08/21/16 1709  . lactated ringers 10 mL/hr at 08/28/16 1126  . magnesium sulfate 1 - 4 g bolus IVPB     PRN Meds: sodium chloride, sodium chloride, acetaminophen **OR** acetaminophen, acetaminophen, alum & mag hydroxide-simeth, bisacodyl, guaiFENesin-dextromethorphan, hydrALAZINE, hydrALAZINE, ipratropium, labetalol, levalbuterol, magnesium sulfate 1 - 4 g bolus IVPB, metoprolol tartrate, ondansetron (ZOFRAN) IV, oxyCODONE-acetaminophen, phenol, potassium chloride, senna-docusate, sodium chloride flush   Vital Signs    Vitals:   08/29/16 0730 08/29/16 0759 08/29/16 0800 08/29/16 0830  BP: 104/62 104/62 121/64 129/79  Pulse: (!) 59 72 72 75  Resp: 14 20 19  (!) 24  Temp:  97.9 F (36.6 C)    TempSrc:  Oral    SpO2: 94% 91% 92% 95%  Weight:      Height:        Intake/Output Summary (Last 24 hours) at 08/29/16 0929 Last data filed at 08/29/16 0849  Gross per 24 hour  Intake             1780 ml  Output              765 ml  Net             1015 ml   Filed Weights   08/21/16 1716 08/21/16 2258 08/28/16 1123  Weight: 138 lb (62.6 kg)  156 lb 14.4 oz (71.2 kg) 156 lb (70.8 kg)    Telemetry    NSR, 60-80s stable, Brief atrial fibrillation postoperatively- Personally Reviewed  ECG    Atrial fibrillation - Personally Reviewed  Physical Exam   GEN: Well nourished, well developed, in no acute distress  HEENT: normal  Neck: no JVD, carotid bruits, or masses Cardiac: RRR; no murmurs, rubs, or gallops,no edema  Respiratory:  clear to auscultation bilaterally, normal work of breathing GI: soft, nontender, nondistended, + BS MS: BKA Skin: post BKA Neuro:  Alert and Oriented x 3, Strength and sensation are intact Psych: euthymic mood, full affect       Labs    Chemistry  Recent Labs Lab 08/24/16 0233 08/25/16 0128 08/28/16 0320  NA 131* 129* 130*  K 3.7 3.9 3.5  CL 102 99* 101  CO2 24 26 25   GLUCOSE 104* 122* 93  BUN 16 18 9   CREATININE 0.71 0.71 0.63  CALCIUM 7.5* 7.6* 7.4*  GFRNONAA >60 >60 >60  GFRAA >60 >60 >60  ANIONGAP 5 4* 4*     Hematology  Recent Labs Lab 08/24/16 1619 08/25/16 0128 08/28/16 0320  WBC 27.5* 21.2* 17.4*  RBC 3.57* 3.38* 3.26*  HGB 10.0* 9.4* 9.3*  HCT 29.9* 28.1* 27.9*  MCV 83.8 83.1 85.6  MCH 28.0 27.8 28.5  MCHC 33.4 33.5 33.3  RDW 15.5 15.5 17.1*  PLT 163 166 159    Cardiac Enzymes  Recent Labs Lab 08/22/16 1036  TROPONINI <0.03   No results for input(s): TROPIPOC in the last 168 hours.   BNP No results for input(s): BNP, PROBNP in the last 168 hours.   DDimer No results for input(s): DDIMER in the last 168 hours.   Radiology    No results found.  Cardiac Studies   Echocardiogram 08/1716 - Left ventricle: The cavity size was normal. Systolic function was   normal. The estimated ejection fraction was in the range of 60%   to 65%. Wall motion was normal; there were no regional wall   motion abnormalities. - Aortic valve: Transvalvular velocity was within the normal range.   There was no stenosis. There was no regurgitation. - Mitral valve:  Transvalvular velocity was within the normal range.   There was no evidence for stenosis. There was no regurgitation. - Right ventricle: The cavity size was normal. Wall thickness was   normal. Systolic function was normal. - Atrial septum: No defect or patent foramen ovale was identified   by color flow Doppler. - Tricuspid valve: There was no regurgitation.  Patient Profile     76 y.o. 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  - Had another episode of postoperative atrial fibrillation on 08/28/16, brief, converted   - Echocardiogram reassuring with normal EF.  - Amiodarone 200 mg twice a day. Tomorrow change to 200 mg PO QD.   - Reevaluate need for amiodarone in approximately 4 weeks.  - Eliquis 5 mg twice a day was started on 8/18 (had only 3 doses prior to discontinuation - just barely at steady state). Stopped because of hemoptysis which resolved spontaneously. See Dr. Stephens Shire note.   - Dr. Donzetta Matters started him back on heparin IV postoperatively.   - transition to Eliquis or other anticoagulation when comfortable from vascular surgery perspective.  - He was asymptomatic with his atrial fibrillation.    Chronic anticoagulation  - Back on heparin IV   Hemoptysis  - brief associated with cough. Hg stable.   - no further occurrence   Peripheral vascular disease  - Status post BKA   No further recommendations at this time. We will sign off. Please let us know we can be of further assistance.  Signed, Candee Furbish, MD  08/29/2016, 9:29 AM

## 2016-08-29 NOTE — Plan of Care (Signed)
Problem: Acute Rehab PT Goals(only PT should resolve) Goal: Patient Will Transfer Sit To/From Stand Outcome: Not Met (add Reason) Pt goals revised 2' R BKA Goal: Pt Will Ambulate Outcome: Not Met (add Reason) Pt goals revised 2' R BKA Goal: Pt Will Go Up/Down Stairs Outcome: Not Met (add Reason) Pt goals revised 2' to R BKA

## 2016-08-29 NOTE — Progress Notes (Signed)
Patient HR went into AFib with RVR. Lopressor given as ordered PRN. Patient asymptomatic. Denies any pain or discomfort. MD on call notified, no new order but to continue to monitor.

## 2016-08-29 NOTE — Progress Notes (Signed)
Occupational Therapy Re-Evaluation Patient Details Name: Edward Summers MRN: [id number] DOB: [birth year] Today's Date: 08/29/2016    History of Present Illness 76 year old male with history of hypertension, hyperlipidemia, emphysema who was admitted by vascular surgery for acute right leg ischemia and underwent surgical thromboembolectomy of right popliteal, PT and AT arteries. Hospitalist was consulted because patient developed postprocedure atrial fibrillation with rapid ventricular rate and hypotension. Cardiology was also consulted for the same. 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   PMH:  HLD, HTN, COPD   Clinical Impression   Pt has now undergone BKA.  He requires max - total A for LB ADLs and mod A- max a  for functional transfers.  Goals were updated.  Feel he will benefit from CIR.  Will follow.     Follow Up Recommendations  CIR    Equipment Recommendations  3 in 1 bedside commode    Recommendations for Other Services Rehab consult     Precautions / Restrictions Precautions Precautions: Fall Restrictions Weight Bearing Restrictions: Yes RLE Weight Bearing: Non weight bearing      Mobility Bed Mobility Overal bed mobility: Needs Assistance Bed Mobility: Supine to Sit     Supine to sit: Min guard     General bed mobility comments: Pt sitting up in recliner   Transfers Overall transfer level: Needs assistance Equipment used: Rolling walker (2 wheeled) Transfers: Sit to/from [facility] Sit to Stand: Mod assist Stand pivot transfers: Mod assist;+2 physical assistance       General transfer comment: requires assist to power up in to standing        Balance Overall balance assessment: Needs assistance Sitting-balance support: No upper extremity supported;Feet unsupported Sitting balance-Leahy Scale: Good     Standing balance support: Bilateral upper extremity supported Standing balance-Leahy Scale: Poor Standing balance comment: RW for support                            ADL either performed or assessed with clinical judgement   ADL Overall ADL's : Needs assistance/impaired Eating/Feeding: Set up;Sitting   Grooming: Wash/dry hands;Wash/dry face;Oral care;Brushing hair;Set up;Sitting   Upper Body Bathing: Set up;Sitting   Lower Body Bathing: Moderate assistance;Sit to/from stand   Upper Body Dressing : Supervision/safety;Set up;Sitting   Lower Body Dressing: Total assistance;Sit to/from stand   Toilet Transfer: Total assistance   Toileting- Clothing Manipulation and Hygiene: Total assistance;Sit to/from stand       Functional mobility during ADLs: Moderate assistance General ADL Comments: Pt able to stand with bil. UE support  with mod A.  He is unable to weight his Rt hna.     Vision         Perception     Praxis      Pertinent Vitals/Pain Pain Assessment: Faces Faces Pain Scale: Hurts little more Pain Location: L LE with standing Pain Descriptors / Indicators: Grimacing;Sore;Guarding Pain Intervention(s): Monitored during session;Repositioned     Hand Dominance     Extremity/Trunk Assessment Upper Extremity Assessment Upper Extremity Assessment: Generalized weakness   Lower Extremity Assessment Lower Extremity Assessment: Defer to PT evaluation       Communication Communication Communication: No difficulties   Cognition Arousal/Alertness: Awake/alert Behavior During Therapy: WFL for tasks assessed/performed Overall Cognitive Status: Within Functional Limits for tasks assessed  General Comments  VSS    Exercises Exercises: Other exercises;Amputee Amputee Exercises Knee Flexion: AROM;Right;10 reps;Supine Knee Extension: AROM;Right;10 reps;Supine Other Exercises Other Exercises: Pt instructed in chair pushups and instructed him to perform at least 10x/day   Shoulder Instructions      Home Living Family/patient expects to be  discharged to:: Private residence Living Arrangements: Spouse/significant other Available Help at Discharge: Family;Friend(s);Available 24 hours/day Type of Home: House Home Access: Stairs to enter [employer] of Steps: 3 Entrance Stairs-Rails: Right Home Layout: One level     Bathroom Shower/Tub: Corporate investment banker: Standard Bathroom Accessibility: Yes How Accessible: Accessible via walker;Accessible via wheelchair Home Equipment: Kasandra Knudsen - single point;Wheelchair - manual          Prior Functioning/Environment Level of Independence: Independent with assistive device(s)        Comments: Uses cane due to pain.  Takes sponge baths.        OT Problem List: Decreased strength;Decreased activity tolerance;Impaired balance (sitting and/or standing);Decreased knowledge of use of DME or AE;Pain;Increased edema;Decreased safety awareness      OT Treatment/Interventions: Self-care/ADL training;Energy conservation;DME and/or AE instruction;Therapeutic activities;Patient/family education;Balance training    OT Goals(Current goals can be found in the care plan section) Acute Rehab OT Goals Patient Stated Goal: return home OT Goal Formulation: With patient Time For Goal Achievement: 09/12/16 Potential to Achieve Goals: Good ADL Goals Pt Will Perform Grooming: with supervision;standing  OT Frequency: Min 2X/week   Barriers to D/C:            Co-evaluation              AM-PAC PT "6 Clicks" Daily Activity     Outcome Measure Help from another person eating meals?: None Help from another person taking care of personal grooming?: A Little Help from another person toileting, which includes using toliet, bedpan, or urinal?: A Lot Help from another person bathing (including washing, rinsing, drying)?: A Lot Help from another person to put on and taking off regular upper body clothing?: A Little Help from another person to put on and taking off  regular lower body clothing?: A Lot 6 Click Score: 16   End of Session Equipment Utilized During Treatment: Gait belt Nurse Communication: Mobility status  Activity Tolerance: Patient tolerated treatment well Patient left: in chair;with call bell/phone within reach  OT Visit Diagnosis: Unsteadiness on feet (R26.81);Other abnormalities of gait and mobility (R26.89);Pain Pain - Right/Left: Right Pain - part of body: Leg                Time: 9892-1194 OT Time Calculation (min): 18 min Charges:  OT General Charges $OT Visit: 1 Procedure OT Evaluation $OT Re-eval: 1 Procedure G-Codes:     Lucille Passy, OTR/L [phone]   Lucille Passy M 08/29/2016, 3:49 PM

## 2016-08-29 NOTE — Progress Notes (Signed)
ANTICOAGULATION CONSULT NOTE - Initial Consult  Pharmacy Consult for heparin Indication: VTE  Allergies  Allergen Reactions  . Zithromax [Azithromycin] Other (See Comments)    Side pain    Patient Measurements: Height: 5\' 7"  (170.2 cm) Weight: 156 lb (70.8 kg) IBW/kg (Calculated) : 66.1  Vital Signs: Temp: 98.3 F (36.8 C) (08/24 0432) Temp Source: Oral (08/24 0432) BP: 100/56 (08/24 0432) Pulse Rate: 63 (08/24 0445)  Labs:  Recent Labs  08/28/16 0320  HGB 9.3*  HCT 27.9*  PLT 159  CREATININE 0.63    Estimated Creatinine Clearance: 75.7 mL/min (by C-G formula based on SCr of 0.63 mg/dL).   Medical History: Past Medical History:  Diagnosis Date  . Arthritis    "probably; get sore all over" (08/21/2016)  . Dyspnea   . Emphysema of lung ([place])    "from smoking" (08/21/2016)  . History of gout X 1  . HLD (hyperlipidemia)    [name] 08/21/2016  . Hypertension   . Pneumonia 1970s    Medications:  Prescriptions Prior to Admission  Medication Sig Dispense Refill Last Dose  . atorvastatin (LIPITOR) 40 MG tablet Take 40 mg by mouth at bedtime.    Past Week at Unknown time  . lisinopril (PRINIVIL,ZESTRIL) 10 MG tablet Take 10 mg by mouth at bedtime.    Past Week at Unknown time  . predniSONE (DELTASONE) 10 MG tablet Take 10-30 mg by mouth See admin instructions. Take 30 mg by mouth daily for 3 days, take 20 mg by mouth daily for 3 days, then take 10 mg by mouth daily for 3 days.   Past Week at Unknown time   Scheduled:  . amiodarone  200 mg Oral BID  . atorvastatin  40 mg Oral q1800  . dextromethorphan-guaiFENesin  1 tablet Oral BID  . docusate sodium  100 mg Oral Daily  . feeding supplement  1 Container Oral TID BM  . mometasone-formoterol  2 puff Inhalation BID  . pantoprazole  40 mg Oral Daily  . predniSONE  30 mg Oral Q breakfast  . sodium chloride flush  3 mL Intravenous Q12H   Infusions:  . sodium chloride    . sodium chloride 125 mL/hr at 08/21/16 2045   . sodium chloride    . lactated ringers 10 mL/hr at 08/21/16 1709  . lactated ringers 10 mL/hr at 08/28/16 1126  . magnesium sulfate 1 - 4 g bolus IVPB      Assessment: 76yo male s/o BKA POD#1 to begin heparin for h/o VTE.  Goal of Therapy:  Heparin level 0.3-0.7 units/ml Monitor platelets by anticoagulation protocol: Yes   Plan:  Will begin heparin gtt at 1200 units/hr (therapeutic at this rate earlier this month) and monitor heparin levels and CBC.  Wynona Neat, PharmD, BCPS  08/29/2016,7:52 AM

## 2016-08-29 NOTE — Progress Notes (Signed)
I met with pt at bedside to begin discussions concerning a possible inpt rehab admit when pt medically ready. He prefers an inpt rehab rather than SNF. I await PT and OT evals so that I can begin insurance authorization with [employer]. [phone]

## 2016-08-29 NOTE — Progress Notes (Signed)
Physical Therapy Re-evaluation and Treatment Patient Details Name: Edward Summers MRN: [id number] DOB: August 06, 1940 Today's Date: 08/29/2016    History of Present Illness 76 year old male with history of hypertension, hyperlipidemia, emphysema who was admitted by vascular surgery for acute right leg ischemia and underwent surgical thromboembolectomy of right popliteal, PT and AT arteries. Hospitalist was consulted because patient developed postprocedure atrial fibrillation with rapid ventricular rate and hypotension. Cardiology was also consulted for the same. 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   PMH:  HLD, HTN, COPD    PT Comments    Pt is in good spirits and willing to work with therapy today. Pt currently min guard for bed mobility, and modAx2 for stand pivot transfer to recliner using RW. Pt goals have been adjusted secondary to his R BKA, however he has good potential for achieving those goals. Pt requires skilled PT to progress his mobility training and continue his LE therex in preparation for possible prosthetic fitting in future.     Follow Up Recommendations  CIR     Equipment Recommendations  Other (comment) (to be determined at next venue)       Precautions / Restrictions Precautions Precautions: Fall Restrictions Weight Bearing Restrictions: Yes RLE Weight Bearing: Non weight bearing    Mobility  Bed Mobility Overal bed mobility: Needs Assistance Bed Mobility: Supine to Sit     Supine to sit: Min guard     General bed mobility comments: min guard for safety able to scoot to EoB with bedrail assist  Transfers Overall transfer level: Needs assistance Equipment used: Rolling walker (2 wheeled) Transfers: Sit to/from [facility] Sit to Stand: Mod assist;+2 physical assistance Stand pivot transfers: Mod assist;+2 physical assistance       General transfer comment: cues for hand placement; assist to power up into standing, and blocking of L knee in standing,  Pt able to pivot on LLE towards recliner on his L side.       Balance Overall balance assessment: Needs assistance Sitting-balance support: No upper extremity supported;Feet unsupported Sitting balance-Leahy Scale: Good     Standing balance support: Bilateral upper extremity supported Standing balance-Leahy Scale: Poor Standing balance comment: RW for support                            Cognition Arousal/Alertness: Awake/alert Behavior During Therapy: WFL for tasks assessed/performed Overall Cognitive Status: Within Functional Limits for tasks assessed                                        Exercises Amputee Exercises Knee Flexion: AROM;Right;10 reps;Supine Knee Extension: AROM;Right;10 reps;Supine Other Exercises Other Exercises: sheet stretch of L gastroc, 3x 10 sec hold    General Comments General comments (skin integrity, edema, etc.): VSS      Pertinent Vitals/Pain Pain Assessment: Faces Faces Pain Scale: Hurts even more Pain Location: L LE with standing Pain Descriptors / Indicators: Grimacing;Sore;Guarding Pain Intervention(s): Limited activity within patient's tolerance;Monitored during session           PT Goals (current goals can now be found in the care plan section) Acute Rehab PT Goals PT Goal Formulation: With patient Time For Goal Achievement: 09/05/16 Potential to Achieve Goals: Good Progress towards PT goals: Goals downgraded-see care plan    Frequency    Min 3X/week      PT Plan Discharge  plan needs to be updated       AM-PAC PT "6 Clicks" Daily Activity  Outcome Measure  Difficulty turning over in bed (including adjusting bedclothes, sheets and blankets)?: None Difficulty moving from lying on back to sitting on the side of the bed? : A Lot Difficulty sitting down on and standing up from a chair with arms (e.g., wheelchair, bedside commode, etc,.)?: Unable Help needed moving to and from a bed to chair  (including a wheelchair)?: A Lot Help needed walking in hospital room?: Total Help needed climbing 3-5 steps with a railing? : Total 6 Click Score: 11    End of Session Equipment Utilized During Treatment: Gait belt Activity Tolerance: Patient tolerated treatment well Patient left: in bed;in chair;with chair alarm set;with call bell/phone within reach (With LE's elevated) Nurse Communication: Mobility status PT Visit Diagnosis: Unsteadiness on feet (R26.81);Muscle weakness (generalized) (M62.81);Difficulty in walking, not elsewhere classified (R26.2);Pain Pain - Right/Left: Right Pain - part of body: Leg;Ankle and joints of foot     Time: 9242-6834 PT Time Calculation (min) (ACUTE ONLY): 17 min  Charges:  $Therapeutic Activity: 8-22 mins                    G Codes:       Million Maharaj B. Migdalia Dk PT, DPT Acute Rehabilitation  [phone] Pager [phone]     [place] 08/29/2016, 1:41 PM

## 2016-08-29 NOTE — Progress Notes (Signed)
Nutrition Follow Up  DOCUMENTATION CODES:   Not applicable  INTERVENTION:    Continue Boost Breeze po TID, each supplement provides 250 kcal and 9 grams of protein  NUTRITION DIAGNOSIS:   Increased nutrient needs related to acute illness as evidenced by estimated needs, ongoing  GOAL:   Patient will meet greater than or equal to 90% of their needs, met  MONITOR:   PO intake, Supplement acceptance, Labs, Weight trends, Skin, I & O's  ASSESSMENT:   76 yo Male with history of hypertension, hyperlipidemia, emphysema who was admitted by vascular surgery for acute right leg ischemia and underwent surgical thromboembolectomy of right popliteal, PT and AT arteries. Hospitalist was consulted because patient developed postprocedure atrial fibrillation with rapid ventricular rate and hypotension. Cardiology was also consulted for the same.  Pt admitted with acute R lower extremity ischemia. Ischemia became critical and therefore he is s/p BKA 8/23. PO intake 100% per flowsheet records. Receiving Boost Breeze TID. Labs and medications reviewed. Na 130 (L). Noted Rapid Response event this AM.  Diet Order:  Diet heart healthy/carb modified Room service appropriate? Yes; Fluid consistency: Thin  Skin:  Reviewed, no issues  Last BM:  8/21  Height:   Ht Readings from Last 1 Encounters:  08/28/16 '5\' 7"'$  (1.702 m)   Weight:   Wt Readings from Last 1 Encounters:  08/28/16 156 lb (70.8 kg)   Ideal Body Weight:  67.2 kg  BMI:  Body mass index is 24.43 kg/m.  Estimated Nutritional Needs:   Kcal:  1800-2000  Protein:  90-110 gm  Fluid:  1.8-2.0 L  EDUCATION NEEDS:   No education needs identified at this time  Arthur Holms, RD, LDN Pager #: [phone] After-Hours Pager #: [phone]

## 2016-08-29 NOTE — Care Management Important Message (Signed)
Important Message  Patient Details  Name: Edward Summers MRN: [id number] Date of Birth: 01-22-1940   Medicare Important Message Given:  Yes    [name] 08/29/2016, 11:01 AM

## 2016-08-30 LAB — HEPARIN LEVEL (UNFRACTIONATED): Heparin Unfractionated: 0.68 IU/mL (ref 0.30–0.70)

## 2016-08-30 LAB — CBC
HCT: 29.5 % — ABNORMAL LOW (ref 39.0–52.0)
Hemoglobin: 9.7 g/dL — ABNORMAL LOW (ref 13.0–17.0)
MCH: 28 pg (ref 26.0–34.0)
MCHC: 32.9 g/dL (ref 30.0–36.0)
MCV: 85.3 fL (ref 78.0–100.0)
PLATELETS: 176 10*3/uL (ref 150–400)
RBC: 3.46 MIL/uL — AB (ref 4.22–5.81)
RDW: 17.2 % — ABNORMAL HIGH (ref 11.5–15.5)
WBC: 24.3 10*3/uL — AB (ref 4.0–10.5)

## 2016-08-30 MED ORDER — METOPROLOL TARTRATE 25 MG PO TABS
25.0000 mg | ORAL_TABLET | Freq: Four times a day (QID) | ORAL | Status: DC
  Administered 2016-08-31: 25 mg via ORAL
  Filled 2016-08-30: qty 1

## 2016-08-30 MED ORDER — AMIODARONE IV BOLUS ONLY 150 MG/100ML
150.0000 mg | Freq: Once | INTRAVENOUS | Status: AC
  Administered 2016-08-30: 150 mg via INTRAVENOUS
  Filled 2016-08-30: qty 100

## 2016-08-30 NOTE — Clinical Social Work Note (Signed)
Clinical Social Work Assessment  Patient Details  Name: Edward Summers MRN: [id number] Date of Birth: May 25, 1940  Date of referral:  08/30/16               Reason for consult:  Facility Placement, Discharge Planning                Permission sought to share information with:  Chartered certified accountant granted to share information::  Yes, Verbal Permission Granted  Name::        Agency::  SNF's  Relationship::     Contact Information:     Housing/Transportation Living arrangements for the past 2 months:  Single Family Home Source of Information:  Patient, Medical Team Patient Interpreter Needed:  None Criminal Activity/Legal Involvement Pertinent to Current Situation/Hospitalization:  No - Comment as needed Significant Relationships:  Adult Children, Friend, Significant Other Lives with:  Significant Other Do you feel safe going back to the place where you live?  Yes Need for family participation in patient care:  Yes (Comment)  Care giving concerns:  PT recommending CIR when medically stable for discharge. CSW initiating SNF backup referral.   Social Worker assessment / plan:  CSW met with patient. No supports at bedside. CSW introduced role and explained that PT recommendations would be discussed. Patient is aware of CIR recommendation and is agreeable to SNF backup plan. Discussed possible barriers to CIR and how insurance pays for SNF. Patient stated he would like to return home. Discussed current barriers to return home. Patient states he lives with his significant other but his son and friend that he has known since he was in school are nearby and always willing to assist him at home. He has three steps to enter his home and his son plans on building a ramp as well as putting hand rails and any other equipment he may need in the home. CSW provided SNF list for review. Patient states he may go to CIR on Monday. No further concerns. CSW encouraged patient to contact  CSW as needed. CSW will continue to follow patient for support and facilitate discharge to SNF, if needed, once medically stable.  Employment status:  Retired Nurse, adult PT Recommendations:  Inpatient [place] / Referral to community resources:  [place]  Patient/Family's Response to care:  Patient agreeable to SNF placement if he is unable to go to CIR. Patient's family and friends supportive and involved in patient's care. Patient appreciated social work intervention.  Patient/Family's Understanding of and Emotional Response to Diagnosis, Current Treatment, and Prognosis:  Patient has a good understanding of the reason for admission and understands that he will need rehab prior to returning home. Patient appears happy with hospital care.  Emotional Assessment Appearance:  Appears stated age Attitude/Demeanor/Rapport:  Other (Pleasant) Affect (typically observed):  Accepting, Appropriate, Calm, Pleasant Orientation:  Oriented to Self, Oriented to Place, Oriented to  Time, Oriented to Situation Alcohol / Substance use:  Never Used Psych involvement (Current and /or in the community):  No (Comment)  Discharge Needs  Concerns to be addressed:  Care Coordination Readmission within the last 30 days:  No Current discharge risk:  Dependent with Mobility Barriers to Discharge:  Continued Medical Work up, [place], LCSW 08/30/2016, 10:29 AM

## 2016-08-30 NOTE — Progress Notes (Signed)
ANTICOAGULATION CONSULT NOTE - Follow Up Consult  Pharmacy Consult for Heparin Indication: afib  Allergies  Allergen Reactions  . Zithromax [Azithromycin] Other (See Comments)    Side pain    Patient Measurements: Height: 5\' 7"  (170.2 cm) Weight: 156 lb (70.8 kg) IBW/kg (Calculated) : 66.1 Heparin Dosing Weight:  70.8 kg  Vital Signs: Temp: 99 F (37.2 C) (08/25 1200) Temp Source: Oral (08/25 1200) BP: 123/85 (08/25 1200) Pulse Rate: 81 (08/25 1200)  Labs:  Recent Labs  08/28/16 0320 08/29/16 1640 08/30/16 0326  HGB 9.3*  --  9.7*  HCT 27.9*  --  29.5*  PLT 159  --  176  HEPARINUNFRC  --  0.30 0.68  CREATININE 0.63  --   --     Estimated Creatinine Clearance: 75.7 mL/min (by C-G formula based on SCr of 0.63 mg/dL).   Assessment:  Anticoag: Eliquis 5mg  BID for afib/ ischemic foot; s/p OR 8/16 PM where pt received intra-arterial tpa and heparin.  Hep ordered post-op.  **also new afib, converted to NSR 8/17 on amio gtt Had hemoptysis while on Eliquis, now resolved. HL 0.68 in goal. Hgb 9. Plts 176 stable.  Goal of Therapy:  Heparin level 0.3-0.7 units/ml Monitor platelets by anticoagulation protocol: Yes   Plan:  Heparin at 1250 units / hr Daily HL and CBC. Transition back to Eliquis? CIR pending possible on Mon.  Edward Summers Edward Summers, PharmD, BCPS Clinical Staff Pharmacist Pager [phone]  Edward Summers 08/30/2016,1:21 PM

## 2016-08-30 NOTE — Progress Notes (Signed)
   Daily Progress Note   Pt has known PAF.  Was seen by Dr. Marlou Porch a few days ago.  Cardiology has been contacted to re-evaluate the patient given recurrent afib now with RVR.   Adele Barthel, MD, FACS [facility] Office: [phone] Pager: [phone]  08/30/2016, 9:16 PM

## 2016-08-30 NOTE — NC FL2 (Signed)
[place] LEVEL OF CARE SCREENING TOOL     IDENTIFICATION  Patient Name: Edward Summers Birthdate: 1940/09/05 Sex: male Admission Date (Current Location): 08/21/2016  [facility] and [place] Number:  Herbalist and Address:  [facility] [place] [address], [place], [place] [address]      Provider Number: [id number]  Attending Physician Name and Address:  [place], [place] Name and Phone Number:       Current Level of Care: Hospital Recommended Level of Care: [place] Prior Approval Number:    Date Approved/Denied:   PASRR Number: [id number]  Discharge Plan: SNF    Current Diagnoses: Patient Active Problem List   Diagnosis Date Noted  . New onset atrial fibrillation ([place]) 08/22/2016  . Atrial fibrillation with RVR ([place]) 08/22/2016  . HLD (hyperlipidemia) 08/22/2016  . Essential hypertension 08/22/2016  . COPD with acute exacerbation ([place]) 08/22/2016  . Ischemic foot   . Leg swelling 08/21/2016  . Ischemic pain of foot, right 08/21/2016    Orientation RESPIRATION BLADDER Height & Weight     Self, Time, Situation, Place  Normal Continent Weight: 156 lb (70.8 kg) Height:  5\' 7"  (170.2 cm)  BEHAVIORAL SYMPTOMS/MOOD NEUROLOGICAL BOWEL NUTRITION STATUS   (None)  (None) Continent Diet (Heart healthy/carb modified)  AMBULATORY STATUS COMMUNICATION OF NEEDS Skin   Extensive Assist Verbally Surgical wounds                       Personal Care Assistance Level of Assistance  Bathing, Feeding, Dressing Bathing Assistance: Limited assistance Feeding assistance: Limited assistance Dressing Assistance: Maximum assistance     Functional Limitations Info  Sight, Hearing, Speech Sight Info: Adequate Hearing Info: Adequate Speech Info: Adequate    SPECIAL CARE FACTORS FREQUENCY  PT (By licensed PT), OT (By licensed OT), Blood pressure     PT Frequency: 5 x week OT Frequency: 5 x week             Contractures Contractures Info: Not present    Additional Factors Info  Code Status, Allergies Code Status Info: Full Allergies Info: Zithromax (Azithromycin)           Current Medications (08/30/2016):  This is the current hospital active medication list Current Facility-Administered Medications  Medication Dose Route Frequency Provider Last Rate Last Dose  . 0.9 %  sodium chloride infusion  500 mL Intravenous Once PRN Laurence Slate M, PA-C      . 0.9 %  sodium chloride infusion   Intravenous Continuous Ulyses Amor, PA-C 125 mL/hr at 08/21/16 2045    . 0.9 %  sodium chloride infusion  250 mL Intravenous PRN Angelia Mould, MD      . acetaminophen (TYLENOL) tablet 325-650 mg  325-650 mg Oral Q4H PRN Ulyses Amor, PA-C   650 mg at 08/23/16 1508   Or  . acetaminophen (TYLENOL) suppository 325-650 mg  325-650 mg Rectal Q4H PRN Ulyses Amor, PA-C      . acetaminophen (TYLENOL) tablet 650 mg  650 mg Oral Q4H PRN Angelia Mould, MD      . alum & mag hydroxide-simeth (MAALOX/MYLANTA) 200-200-20 MG/5ML suspension 15-30 mL  15-30 mL Oral Q2H PRN Laurence Slate M, PA-C      . amiodarone (PACERONE) tablet 200 mg  200 mg Oral BID Jerline Pain, MD   200 mg at 08/30/16 0906  . atorvastatin (LIPITOR) tablet 40 mg  40 mg Oral q1800 Ulyses Amor, PA-C   40 mg at 08/29/16 1843  . bisacodyl (DULCOLAX) EC tablet 5 mg  5 mg Oral Daily PRN Ulyses Amor, PA-C   5 mg at 08/29/16 2156  . dextromethorphan-guaiFENesin (MUCINEX DM) 30-600 MG per 12 hr tablet 1 tablet  1 tablet Oral BID Ivor Costa, MD   1 tablet at 08/30/16 0906  . docusate sodium (COLACE) capsule 100 mg  100 mg Oral Daily Laurence Slate M, PA-C   100 mg at 08/30/16 1660  . feeding supplement (BOOST / RESOURCE BREEZE) liquid 1 Container  1 Container Oral TID BM Waynetta Sandy, MD   1 Container at 08/30/16 [phone]  . guaiFENesin-dextromethorphan (ROBITUSSIN DM) 100-10 MG/5ML syrup 15 mL  15 mL  Oral Q4H PRN Ulyses Amor, PA-C   15 mL at 08/29/16 0509  . heparin ADULT infusion 100 units/mL (25000 units/259mL sodium chloride 0.45%)  1,250 Units/hr Intravenous Continuous Otilio Miu, RPH 12.5 mL/hr at 08/29/16 2000 1,250 Units/hr at 08/29/16 2000  . hydrALAZINE (APRESOLINE) injection 5 mg  5 mg Intravenous Q20 Min PRN Laurence Slate M, PA-C      . hydrALAZINE (APRESOLINE) injection 5 mg  5 mg Intravenous Q20 Min PRN Angelia Mould, MD      . ipratropium (ATROVENT) nebulizer solution 0.5 mg  0.5 mg Nebulization Q4H PRN Waynetta Sandy, MD      . labetalol (NORMODYNE,TRANDATE) injection 10 mg  10 mg Intravenous Q10 min PRN Angelia Mould, MD      . lactated ringers infusion   Intravenous Continuous Roderic Palau, MD 10 mL/hr at 08/21/16 1709    . lactated ringers infusion   Intravenous Continuous Duane Boston, MD 10 mL/hr at 08/28/16 1126    . levalbuterol (XOPENEX) nebulizer solution 0.63 mg  0.63 mg Nebulization Q4H PRN Waynetta Sandy, MD      . magnesium sulfate IVPB 2 g 50 mL  2 g Intravenous Daily PRN Laurence Slate M, PA-C      . metoprolol tartrate (LOPRESSOR) injection 2-5 mg  2-5 mg Intravenous Q2H PRN Laurence Slate M, PA-C   5 mg at 08/29/16 0015  . mometasone-formoterol (DULERA) 100-5 MCG/ACT inhaler 2 puff  2 puff Inhalation BID Aline August, MD   2 puff at 08/30/16 0855  . ondansetron (ZOFRAN) injection 4 mg  4 mg Intravenous Q6H PRN Angelia Mould, MD      . oxyCODONE-acetaminophen (PERCOCET/ROXICET) 5-325 MG per tablet 1-2 tablet  1-2 tablet Oral Q4H PRN Ulyses Amor, PA-C   2 tablet at 08/30/16 6010  . pantoprazole (PROTONIX) EC tablet 40 mg  40 mg Oral Daily Laurence Slate M, PA-C   40 mg at 08/30/16 9323  . phenol (CHLORASEPTIC) mouth spray 1 spray  1 spray Mouth/Throat PRN Laurence Slate M, PA-C      . potassium chloride SA (K-DUR,KLOR-CON) CR tablet 20-40 mEq  20-40 mEq Oral Daily PRN Laurence Slate M, PA-C      .  predniSONE (DELTASONE) tablet 30 mg  30 mg Oral Q breakfast Aline August, MD   30 mg at 08/30/16 0855  . senna-docusate (Senokot-S) tablet 1 tablet  1 tablet Oral QHS PRN Ulyses Amor, PA-C      . sodium chloride flush (NS) 0.9 % injection 3 mL  3 mL Intravenous Q12H Angelia Mould, MD   3 mL at 08/30/16 0907  . sodium chloride flush (NS) 0.9 % injection 3 mL  3 mL Intravenous PRN Angelia Mould, MD         Discharge Medications: Please see discharge summary for a list of discharge medications.  Relevant Imaging Results:  Relevant Lab Results:   Additional Information SS#: [id number]  Candie Chroman, LCSW

## 2016-08-30 NOTE — Progress Notes (Addendum)
[facility]  Subjective  - Doing better, no new cardiac episodes over night.   Objective 139/71 89 (!) 97.5 F (36.4 C) (Oral) (!) 23 93%  Intake/Output Summary (Last 24 hours) at 08/30/16 0816 Last data filed at 08/30/16 0400  Gross per 24 hour  Intake           306.04 ml  Output              900 ml  Net          -593.96 ml     Right BKA dressing clean and dry, limb without edema Heart RRR Lungs non labored breathing  Assessment/Planning: POD # 2 Right BKA Will order biotech BKA sock and plan to change dressing tomorrow HGB stable 9.7   CIR pending possible on Mon.  Cardiology following: Paroxysmal atrial fibrillation  - Converted to sinus rhythm 08/22/16 at around 6 PM.  - Had another episode of postoperative atrial fibrillation on 08/28/16, brief, converted   - Echocardiogram reassuring with normal EF.  - Amiodarone 200 mg twice a day. Tomorrow change to 200 mg PO QD.   - Reevaluate need for amiodarone in approximately 4 weeks.  - Eliquis 5 mg twice a day was started on 8/18 (had only 3 doses prior to discontinuation - just barely at steady state). Stopped because of hemoptysis which resolved spontaneously. See Dr. Stephens Shire note.   - Dr. Donzetta Matters started him back on heparin IV postoperatively.   - transition to Eliquis or other anticoagulation when comfortable from vascular surgery perspective.  - He was asymptomatic with his atrial fibrillation.  Theda Sers [place] 08/30/2016 8:16 AM  Addendum  I have independently interviewed and examined the patient, and I agree with the physician assistant's findings.  R BKA is echymotic but viable.  - ABD pad and stump sock   Adele Barthel, MD, FACS [facility]: [phone] Pager: [phone]  08/30/2016, 9:40 AM  ---  Laboratory Lab Results:  Recent Labs  08/28/16 0320 08/30/16 0326  WBC 17.4* 24.3*  HGB 9.3* 9.7*  HCT 27.9* 29.5*   PLT 159 176   BMET  Recent Labs  08/28/16 0320  NA 130*  K 3.5  CL 101  CO2 25  GLUCOSE 93  BUN 9  CREATININE 0.63  CALCIUM 7.4*    COAG Lab Results  Component Value Date   INR 2.07 08/25/2016   INR 1.14 08/21/2016   No results found for: PTT

## 2016-08-30 NOTE — Progress Notes (Signed)
Cardiology Brief Progress Note  Called to reassess pt due to recurrent afib. Pt was being followed by [facility] cardiology, last seen 08-29-16 by Dr. Marlou Porch. Pt has had several episodes of afib post-op for vascular surgery done for acute limb ischemia. He has been on amiodarone PO for several days, but not clear if the load has been adequate. He had been on eliquis; that was held due to hemoptysis apparently. I am not going to restart anything for now. Duration of recurrent afib <24h He went back into afib this evening and currently has HR in 140s-160s. Pt is completely asymptomatic.   Current Facility-Administered Medications:  .  0.9 %  sodium chloride infusion, 500 mL, Intravenous, Once PRN, Theda Sers, Emma M, PA-C .  0.9 %  sodium chloride infusion, , Intravenous, Continuous, Ulyses Amor, [place], Last Rate: 125 mL/hr at 08/21/16 2045 .  0.9 %  sodium chloride infusion, 250 mL, Intravenous, PRN, Angelia Mould, MD .  acetaminophen (TYLENOL) tablet 325-650 mg, 325-650 mg, Oral, Q4H PRN, 650 mg at 08/23/16 1508 **OR** acetaminophen (TYLENOL) suppository 325-650 mg, 325-650 mg, Rectal, Q4H PRN, Theda Sers, Emma M, PA-C .  acetaminophen (TYLENOL) tablet 650 mg, 650 mg, Oral, Q4H PRN, Angelia Mould, MD .  alum & mag hydroxide-simeth (MAALOX/MYLANTA) 200-200-20 MG/5ML suspension 15-30 mL, 15-30 mL, Oral, Q2H PRN, Theda Sers, Emma M, PA-C .  amiodarone (NEXTERONE) IV bolus only 150 mg/100 mL, 150 mg, Intravenous, Once, Rudean Curt, MD .  amiodarone (PACERONE) tablet 200 mg, 200 mg, Oral, BID, Jerline Pain, MD, 200 mg at 08/30/16 0906 .  atorvastatin (LIPITOR) tablet 40 mg, 40 mg, Oral, q1800, Ulyses Amor, PA-C, 40 mg at 08/30/16 1714 .  bisacodyl (DULCOLAX) EC tablet 5 mg, 5 mg, Oral, Daily PRN, Ulyses Amor, PA-C, 5 mg at 08/29/16 2156 .  dextromethorphan-guaiFENesin (MUCINEX DM) 30-600 MG per 12 hr tablet 1 tablet, 1 tablet, Oral, BID, Ivor Costa, MD, 1 tablet at 08/30/16  0906 .  docusate sodium (COLACE) capsule 100 mg, 100 mg, Oral, Daily, Laurence Slate M, PA-C, 100 mg at 08/30/16 6712 .  feeding supplement (BOOST / RESOURCE BREEZE) liquid 1 Container, 1 Container, Oral, TID BM, Waynetta Sandy, MD, 1 Container at 08/30/16 [phone] .  guaiFENesin-dextromethorphan (ROBITUSSIN DM) 100-10 MG/5ML syrup 15 mL, 15 mL, Oral, Q4H PRN, Ulyses Amor, PA-C, 15 mL at 08/29/16 0509 .  heparin ADULT infusion 100 units/mL (25000 units/267mL sodium chloride 0.45%), 1,250 Units/hr, Intravenous, Continuous, Otilio Miu, RPH, Last Rate: 12.5 mL/hr at 08/30/16 1700, 1,250 Units/hr at 08/30/16 1700 .  hydrALAZINE (APRESOLINE) injection 5 mg, 5 mg, Intravenous, Q20 Min PRN, Collins, Emma M, PA-C .  hydrALAZINE (APRESOLINE) injection 5 mg, 5 mg, Intravenous, Q20 Min PRN, Angelia Mould, MD .  ipratropium (ATROVENT) nebulizer solution 0.5 mg, 0.5 mg, Nebulization, Q4H PRN, Waynetta Sandy, MD .  labetalol (NORMODYNE,TRANDATE) injection 10 mg, 10 mg, Intravenous, Q10 min PRN, Angelia Mould, MD .  lactated ringers infusion, , Intravenous, Continuous, Roderic Palau, MD, Last Rate: 10 mL/hr at 08/21/16 1709 .  lactated ringers infusion, , Intravenous, Continuous, Duane Boston, MD, Last Rate: 10 mL/hr at 08/28/16 1126 .  levalbuterol (XOPENEX) nebulizer solution 0.63 mg, 0.63 mg, Nebulization, Q4H PRN, Waynetta Sandy, MD .  magnesium sulfate IVPB 2 g 50 mL, 2 g, Intravenous, Daily PRN, Laurence Slate M, PA-C .  metoprolol tartrate (LOPRESSOR) tablet 25 mg, 25 mg, Oral, Q6H, Rudean Curt, MD .  mometasone-formoterol [facility])  100-5 MCG/ACT inhaler 2 puff, 2 puff, Inhalation, BID, Starla Link, Kshitiz, MD, 2 puff at 08/30/16 2011 .  ondansetron (ZOFRAN) injection 4 mg, 4 mg, Intravenous, Q6H PRN, Angelia Mould, MD .  oxyCODONE-acetaminophen (PERCOCET/ROXICET) 5-325 MG per tablet 1-2 tablet, 1-2 tablet, Oral, Q4H PRN, Ulyses Amor, PA-C, 2 tablet at 08/30/16 1714 .  pantoprazole (PROTONIX) EC tablet 40 mg, 40 mg, Oral, Daily, Laurence Slate M, PA-C, 40 mg at 08/30/16 0349 .  phenol (CHLORASEPTIC) mouth spray 1 spray, 1 spray, Mouth/Throat, PRN, Theda Sers, Emma M, PA-C .  potassium chloride SA (K-DUR,KLOR-CON) CR tablet 20-40 mEq, 20-40 mEq, Oral, Daily PRN, Theda Sers, Emma M, PA-C .  predniSONE (DELTASONE) tablet 30 mg, 30 mg, Oral, Q breakfast, Alekh, Kshitiz, MD, 30 mg at 08/30/16 0855 .  senna-docusate (Senokot-S) tablet 1 tablet, 1 tablet, Oral, QHS PRN, Laurence Slate M, PA-C .  sodium chloride flush (NS) 0.9 % injection 3 mL, 3 mL, Intravenous, Q12H, Angelia Mould, MD, 3 mL at 08/30/16 0907 .  sodium chloride flush (NS) 0.9 % injection 3 mL, 3 mL, Intravenous, PRN, Angelia Mould, MD  BP 105/76   Pulse (!) 142   Temp 98.2 F (36.8 C) (Oral)   Resp 18   Ht 5\' 7"  (1.702 m)   Wt 70.8 kg (156 lb)   SpO2 93%   BMI 24.43 kg/m  CV: irreg irreg, no murmur. Tachy Lungs CTA bilaterally 1+ LLE edema. S/p R BKA  Echocardiogram 08/1716 - Left ventricle: The cavity size was normal. Systolic function was normal. The estimated ejection fraction was in the range of 60% to 65%. Wall motion was normal; there were no regional wall motion abnormalities. - Aortic valve: Transvalvular velocity was within the normal range. There was no stenosis. There was no regurgitation. - Mitral valve: Transvalvular velocity was within the normal range. There was no evidence for stenosis. There was no regurgitation. - Right ventricle: The cavity size was normal. Wall thickness was normal. Systolic function was normal. - Atrial septum: No defect or patent foramen ovale was identified by color flow Doppler. - Tricuspid valve: There was no regurgitation.  A/P: Recommend amiodarone IV bolus 150mg  x 1 dose. Hopefully that will cardiovert. Cont PO amiodarone for now. Also add metoprolol 25mg  PO Q6h. Decision  to restart anti-coagulation can be made in AM. Duration of recurrent afib <24h so he will probably be able to be cardioverted w/o TEE. NPO after MN; DCCV if necessary in the AM Primary consult team will reassess in the AM.  Please call w/ questions.   Rudean Curt, MD , [facility] 08/30/16 9:28 PM

## 2016-08-30 NOTE — Progress Notes (Signed)
Pt converted to afib w/ RVR around 1940. VSS, asymptomatic. Confirmed with EKG, in chart. PRN 5mg  metoprolol given, no change. Chen MD notified, will call cards. Awaiting new orders. Will continue to closely monitor pt.

## 2016-08-30 NOTE — Clinical Social Work Placement (Signed)
   CLINICAL SOCIAL WORK PLACEMENT  NOTE  Date:  08/30/2016  Patient Details  Name: Edward Summers MRN: [id number] Date of Birth: 1940-02-10  Clinical Social Work is seeking post-discharge placement for this patient at the [place] level of care (*CSW will initial, date and re-position this form in  chart as items are completed):  Yes   Patient/family provided with [place] Work Department's list of facilities offering this level of care within the geographic area requested by the patient (or if unable, by the patient's family).  Yes   Patient/family informed of their freedom to choose among providers that offer the needed level of care, that participate in Medicare, Medicaid or managed care program needed by the patient, have an available bed and are willing to accept the patient.  Yes   Patient/family informed of [facility]'s ownership interest in [facility] and [facility], as well as of the fact that they are under no obligation to receive care at these facilities.  PASRR submitted to EDS on 08/30/16     PASRR number received on 08/30/16     Existing PASRR number confirmed on       FL2 transmitted to all facilities in geographic area requested by pt/family on 08/30/16     FL2 transmitted to all facilities within larger geographic area on       Patient informed that his/her managed care company has contracts with or will negotiate with certain facilities, including the following:            Patient/family informed of bed offers received.  Patient chooses bed at       Physician recommends and patient chooses bed at      Patient to be transferred to   on  .  Patient to be transferred to facility by       Patient family notified on   of transfer.  Name of family member notified:        PHYSICIAN Please sign FL2     Additional Comment:    _______________________________________________ Candie Chroman, LCSW 08/30/2016,  10:35 AM

## 2016-08-31 ENCOUNTER — Inpatient Hospital Stay (HOSPITAL_COMMUNITY): Payer: Medicare HMO

## 2016-08-31 DIAGNOSIS — J441 Chronic obstructive pulmonary disease with (acute) exacerbation: Secondary | ICD-10-CM

## 2016-08-31 DIAGNOSIS — E785 Hyperlipidemia, unspecified: Secondary | ICD-10-CM

## 2016-08-31 LAB — CBC
HEMATOCRIT: 27.7 % — AB (ref 39.0–52.0)
HEMOGLOBIN: 9.2 g/dL — AB (ref 13.0–17.0)
MCH: 28.6 pg (ref 26.0–34.0)
MCHC: 33.2 g/dL (ref 30.0–36.0)
MCV: 86 fL (ref 78.0–100.0)
Platelets: 220 10*3/uL (ref 150–400)
RBC: 3.22 MIL/uL — ABNORMAL LOW (ref 4.22–5.81)
RDW: 17.4 % — ABNORMAL HIGH (ref 11.5–15.5)
WBC: 27.5 10*3/uL — ABNORMAL HIGH (ref 4.0–10.5)

## 2016-08-31 LAB — HEPARIN LEVEL (UNFRACTIONATED): Heparin Unfractionated: 0.68 IU/mL (ref 0.30–0.70)

## 2016-08-31 MED ORDER — AMIODARONE HCL 200 MG PO TABS: 200.0000 mg | ORAL_TABLET | Freq: Every day | ORAL | Status: DC

## 2016-08-31 MED ORDER — AMIODARONE HCL 200 MG PO TABS
200.0000 mg | ORAL_TABLET | Freq: Two times a day (BID) | ORAL | Status: DC
  Administered 2016-08-31 – 2016-09-04 (×9): 200 mg via ORAL
  Filled 2016-08-31 (×9): qty 1

## 2016-08-31 MED ORDER — METOPROLOL TARTRATE 25 MG PO TABS
25.0000 mg | ORAL_TABLET | Freq: Two times a day (BID) | ORAL | Status: DC
  Administered 2016-08-31 – 2016-09-01 (×3): 25 mg via ORAL
  Filled 2016-08-31 (×3): qty 1

## 2016-08-31 NOTE — Progress Notes (Signed)
Pt converted to NSR at 0040. HR 50s-60s. VSS. Will continue to monitor.

## 2016-08-31 NOTE — Progress Notes (Signed)
Progress Note  Patient Name: Edward Summers Date of Encounter: 08/31/2016  Primary Cardiologist: Dr. Debara Pickett  Subjective   Status post BKA right. Pain improved. Brief atrial fibrillation episode postoperatively. Another episode of a-fib with RVR starting the last night at 7 pm, started on amiodarone load iv and converted to SR at 4 am, currently in sinus bradycardia.  Inpatient Medications    Scheduled Meds: . [START ON 09/01/2016] amiodarone  200 mg Oral Daily  . atorvastatin  40 mg Oral q1800  . dextromethorphan-guaiFENesin  1 tablet Oral BID  . docusate sodium  100 mg Oral Daily  . feeding supplement  1 Container Oral TID BM  . metoprolol tartrate  25 mg Oral Q6H  . mometasone-formoterol  2 puff Inhalation BID  . pantoprazole  40 mg Oral Daily  . predniSONE  30 mg Oral Q breakfast  . sodium chloride flush  3 mL Intravenous Q12H   Continuous Infusions: . sodium chloride    . sodium chloride 125 mL/hr at 08/21/16 2045  . sodium chloride    . heparin 1,250 Units/hr (08/31/16 0242)  . lactated ringers 10 mL/hr at 08/21/16 1709  . lactated ringers 10 mL/hr at 08/28/16 1126  . magnesium sulfate 1 - 4 g bolus IVPB     PRN Meds: sodium chloride, sodium chloride, acetaminophen **OR** acetaminophen, acetaminophen, alum & mag hydroxide-simeth, bisacodyl, guaiFENesin-dextromethorphan, hydrALAZINE, hydrALAZINE, ipratropium, labetalol, levalbuterol, magnesium sulfate 1 - 4 g bolus IVPB, ondansetron (ZOFRAN) IV, oxyCODONE-acetaminophen, phenol, potassium chloride, senna-docusate, sodium chloride flush   Vital Signs    Vitals:   08/31/16 0007 08/31/16 0436 08/31/16 0800 08/31/16 0958  BP: 103/74  103/61   Pulse: (!) 101  (!) 51   Resp: 19  18   Temp:  97.9 F (36.6 C) 98.4 F (36.9 C)   TempSrc:  Oral Oral   SpO2: 96%  95% 97%  Weight:      Height:        Intake/Output Summary (Last 24 hours) at 08/31/16 1124 Last data filed at 08/31/16 0830  Gross per 24 hour  Intake             577.5 ml  Output              825 ml  Net           -247.5 ml   Filed Weights   08/21/16 1716 08/21/16 2258 08/28/16 1123  Weight: 138 lb (62.6 kg) 156 lb 14.4 oz (71.2 kg) 156 lb (70.8 kg)    Telemetry    NSR, 60-80s stable, Brief atrial fibrillation postoperatively- Personally Reviewed  ECG    Atrial fibrillation - Personally Reviewed  Physical Exam   GEN: Well nourished, well developed, in no acute distress  HEENT: normal  Neck: no JVD, carotid bruits, or masses Cardiac: RRR; no murmurs, rubs, or gallops,no edema  Respiratory:  clear to auscultation bilaterally, normal work of breathing GI: soft, nontender, nondistended, + BS MS: BKA Skin: post BKA Neuro:  Alert and Oriented x 3, Strength and sensation are intact Psych: euthymic mood, full affect  Labs    Chemistry  Recent Labs Lab 08/25/16 0128 08/28/16 0320  NA 129* 130*  K 3.9 3.5  CL 99* 101  CO2 26 25  GLUCOSE 122* 93  BUN 18 9  CREATININE 0.71 0.63  CALCIUM 7.6* 7.4*  GFRNONAA >60 >60  GFRAA >60 >60  ANIONGAP 4* 4*     Hematology  Recent Labs Lab 08/28/16 0320  08/30/16 0326 08/31/16 0521  WBC 17.4* 24.3* 27.5*  RBC 3.26* 3.46* 3.22*  HGB 9.3* 9.7* 9.2*  HCT 27.9* 29.5* 27.7*  MCV 85.6 85.3 86.0  MCH 28.5 28.0 28.6  MCHC 33.3 32.9 33.2  RDW 17.1* 17.2* 17.4*  PLT 159 176 220    Cardiac Enzymes No results for input(s): TROPONINI in the last 168 hours. No results for input(s): TROPIPOC in the last 168 hours.   BNP No results for input(s): BNP, PROBNP in the last 168 hours.   DDimer No results for input(s): DDIMER in the last 168 hours.   Radiology    No results found.  Cardiac Studies   Echocardiogram 08/1716 - Left ventricle: The cavity size was normal. Systolic function was   normal. The estimated ejection fraction was in the range of 60%   to 65%. Wall motion was normal; there were no regional wall   motion abnormalities. - Aortic valve: Transvalvular velocity was  within the normal range.   There was no stenosis. There was no regurgitation. - Mitral valve: Transvalvular velocity was within the normal range.   There was no evidence for stenosis. There was no regurgitation. - Right ventricle: The cavity size was normal. Wall thickness was   normal. Systolic function was normal. - Atrial septum: No defect or patent foramen ovale was identified   by color flow Doppler. - Tricuspid valve: There was no regurgitation.  Patient Profile     76 y.o. 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  - Amiodarone 200 mg twice a day for now.  - decrease metoprolol to 25 mg po bid as he is bradycardic right now  - Reevaluate need for amiodarone in approximately 4 weeks.  - Eliquis 5 mg twice a day was started on 8/18 (had only 3 doses prior to discontinuation - just barely at steady state). Stopped because of hemoptysis which resolved spontaneously. See Dr. Stephens Shire note.   - Dr. Donzetta Matters started him back on heparin IV postoperatively.   - transition to Eliquis or other anticoagulation when comfortable from vascular surgery perspective.   Chronic anticoagulation  - Back on heparin IV   Hemoptysis  - brief associated with cough. Hg stable.   - no further occurrence  Peripheral vascular disease  - Status post BKA   Signed, Ena Dawley, MD  08/31/2016, 11:24 AM

## 2016-08-31 NOTE — Progress Notes (Signed)
Called in room by pt, having violent coughing spell and coughing up copious amounts of bright, red sputum. Lungs wet sounding, pt SOB. O2 sats between 90-93 on room air. Rapid called and assessed, stat CXR done. PRN robitussin for cough given. Chen MD notified, continue heparin drip. Awaiting CXR results. Pt now resting, will continue to closely monitor pt.

## 2016-08-31 NOTE — Progress Notes (Addendum)
[facility]   Subjective  - Patient resting well.   Objective 103/74 (!) 101 97.9 F (36.6 C) (Oral) 19 96%  Intake/Output Summary (Last 24 hours) at 08/31/16 0752 Last data filed at 08/31/16 0200  Gross per 24 hour  Intake            817.5 ml  Output             1125 ml  Net           -307.5 ml    Right stump viable.  ABD and stump sock Heart brady sinus 50's  Assessment/Planning: POD # 3 Right BKA Known PAF with paroxymal A fib received dose of Amiodarone IV bolus 150mg .   On heparin IV.  - Eliquis 5 mg twice a day was started on 8/18 (had only 3 doses prior to discontinuation - just barely at steady state). Stopped because of hemoptysis which resolved  NPO after MN; DCCV if necessary in the AM. Pending SNF    Theda Sers, [place] 08/31/2016 7:52 AM --  Addendum  I have independently interviewed and examined the patient, and I agree with the physician assistant's findings.  Overnight when into Afib with RVR.  Cardiology bolus Amio and pt converted overnight.  Will need to resume anticoagulation for afib.  See what agent Cardio recommends. SNF placement pending   Adele Barthel, MD, FACS [facility] [place]: [phone] Pager: [phone]  08/31/2016, 8:11 AM    Laboratory Lab Results:  Recent Labs  08/30/16 0326 08/31/16 0521  WBC 24.3* 27.5*  HGB 9.7* 9.2*  HCT 29.5* 27.7*  PLT 176 220   BMET No results for input(s): NA, K, CL, CO2, GLUCOSE, BUN, CREATININE, CALCIUM in the last 72 hours.  COAG Lab Results  Component Value Date   INR 2.07 08/25/2016   INR 1.14 08/21/2016   No results found for: PTT

## 2016-08-31 NOTE — Progress Notes (Signed)
ANTICOAGULATION CONSULT NOTE - Follow Up Consult  Pharmacy Consult for Heparin Indication: afib  Allergies  Allergen Reactions  . Zithromax [Azithromycin] Other (See Comments)    Side pain    Patient Measurements: Height: 5\' 7"  (170.2 cm) Weight: 156 lb (70.8 kg) IBW/kg (Calculated) : 66.1 Heparin Dosing Weight:  70.8 kg  Vital Signs: Temp: 98.4 F (36.9 C) (08/26 0800) Temp Source: Oral (08/26 0800) BP: 103/61 (08/26 0800) Pulse Rate: 51 (08/26 0800)  Labs:  Recent Labs  08/29/16 1640 08/30/16 0326 08/31/16 0521  HGB  --  9.7* 9.2*  HCT  --  29.5* 27.7*  PLT  --  176 220  HEPARINUNFRC 0.30 0.68 0.68    Estimated Creatinine Clearance: 75.7 mL/min (by C-G formula based on SCr of 0.63 mg/dL).   Assessment:  Anticoag: Eliquis 5mg  BID for afib/ ischemic foot; s/p OR 8/16 PM where pt received intra-arterial tpa and heparin.  Hep ordered post-op BKA.  **also new afib, converted to NSR 8/17 on amio gtt Had hemoptysis while on Eliquis, now resolved. HL 0.68. Hgb 9.2. Plts 220 stable.  Goal of Therapy:  Heparin level 0.3-0.7 units/ml Monitor platelets by anticoagulation protocol: Yes   Plan:  Heparin at 1250 units / hr Daily HL and CBC. Transition back to Eliquis? DCCV if necessary in the AM. CIR pending possible on Mon.  Jaymarie Yeakel S. Alford Highland, PharmD, BCPS Clinical Staff Pharmacist Pager [phone]  [name] 08/31/2016,11:24 AM

## 2016-09-01 LAB — URINALYSIS, ROUTINE W REFLEX MICROSCOPIC
Bilirubin Urine: NEGATIVE
GLUCOSE, UA: NEGATIVE mg/dL
Hgb urine dipstick: NEGATIVE
Ketones, ur: NEGATIVE mg/dL
LEUKOCYTES UA: NEGATIVE
Nitrite: NEGATIVE
PH: 7 (ref 5.0–8.0)
Protein, ur: NEGATIVE mg/dL
Specific Gravity, Urine: 1.009 (ref 1.005–1.030)

## 2016-09-01 LAB — CBC
HCT: 28.1 % — ABNORMAL LOW (ref 39.0–52.0)
Hemoglobin: 9.4 g/dL — ABNORMAL LOW (ref 13.0–17.0)
MCH: 28.7 pg (ref 26.0–34.0)
MCHC: 33.5 g/dL (ref 30.0–36.0)
MCV: 85.9 fL (ref 78.0–100.0)
Platelets: 187 10*3/uL (ref 150–400)
RBC: 3.27 MIL/uL — ABNORMAL LOW (ref 4.22–5.81)
RDW: 17.4 % — AB (ref 11.5–15.5)
WBC: 25.7 10*3/uL — AB (ref 4.0–10.5)

## 2016-09-01 LAB — HEPARIN LEVEL (UNFRACTIONATED): Heparin Unfractionated: 0.51 IU/mL (ref 0.30–0.70)

## 2016-09-01 MED ORDER — FUROSEMIDE 40 MG PO TABS
40.0000 mg | ORAL_TABLET | Freq: Every day | ORAL | Status: DC
  Administered 2016-09-01 – 2016-09-09 (×9): 40 mg via ORAL
  Filled 2016-09-01 (×9): qty 1

## 2016-09-01 MED ORDER — APIXABAN 5 MG PO TABS
5.0000 mg | ORAL_TABLET | Freq: Two times a day (BID) | ORAL | Status: DC
  Administered 2016-09-01 – 2016-09-03 (×6): 5 mg via ORAL
  Filled 2016-09-01 (×6): qty 1

## 2016-09-01 NOTE — Progress Notes (Signed)
Physical Therapy Treatment Patient Details Name: Edward Summers MRN: [id number] DOB: 17-Jul-1940 Today's Date: 09/01/2016    History of Present Illness 76 year old male with history of hypertension, hyperlipidemia, emphysema who was admitted by vascular surgery for acute right leg ischemia and underwent surgical thromboembolectomy of right popliteal, PT and AT arteries. Hospitalist was consulted because patient developed postprocedure atrial fibrillation with rapid ventricular rate and hypotension. Cardiology was also consulted for the same. 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   PMH:  HLD, HTN, COPD    PT Comments    Pt very fatigued today and requests not getting out of bed. Pt agreeable to performing therex in bed. Pt educated on amputee specific exercises and given handout. Pt requires skilled PT to progress mobility and improve LE strength and endurance to improve safe mobility in his discharge environment.     Follow Up Recommendations  CIR     Equipment Recommendations  Other (comment) (to be determined at next venue)       Precautions / Restrictions Precautions Precautions: Fall Restrictions Weight Bearing Restrictions: Yes RLE Weight Bearing: Non weight bearing    Mobility  Bed Mobility Overal bed mobility: Needs Assistance Bed Mobility: Supine to Sit     Supine to sit: Min guard     General bed mobility comments: min guard for safety able to scoot to EoB with bedrail assist         Cognition Arousal/Alertness: Awake/alert Behavior During Therapy: [facility] for tasks assessed/performed Overall Cognitive Status: Within Functional Limits for tasks assessed                                        Exercises Amputee Exercises Quad Sets: Both;10 reps;AROM;Supine Gluteal Sets: AROM;Both;10 reps;Supine Hip ABduction/ADduction: AROM;Both;10 reps Hip Flexion/Marching: AROM;Both;10 reps;Supine Knee Flexion: AROM;Right;10 reps;Supine;Both Knee Extension: AROM;10  reps;Supine;Both Straight Leg Raises: AROM;Both;10 reps;Supine    General Comments General comments (skin integrity, edema, etc.): VSS, Pt reports spitting up blood last night and feeling very fatigued and really doesn't want to get up to chair.      Pertinent Vitals/Pain Pain Assessment: 0-10 Faces Pain Scale: Hurts even more Pain Location: bottom, L LE with resting on bed surface Pain Descriptors / Indicators: Grimacing;Sore;Guarding Pain Intervention(s): Limited activity within patient's tolerance;Monitored during session           PT Goals (current goals can now be found in the care plan section) Acute Rehab PT Goals PT Goal Formulation: With patient Time For Goal Achievement: 09/05/16 Potential to Achieve Goals: Good Progress towards PT goals: PT to reassess next treatment    Frequency    Min 3X/week      PT Plan Discharge plan needs to be updated       AM-PAC PT "6 Clicks" Daily Activity  Outcome Measure  Difficulty turning over in bed (including adjusting bedclothes, sheets and blankets)?: None Difficulty moving from lying on back to sitting on the side of the bed? : A Lot Difficulty sitting down on and standing up from a chair with arms (e.g., wheelchair, bedside commode, etc,.)?: Unable Help needed moving to and from a bed to chair (including a wheelchair)?: A Lot Help needed walking in hospital room?: Total Help needed climbing 3-5 steps with a railing? : Total 6 Click Score: 11    End of Session   Activity Tolerance: Patient tolerated treatment well Patient left: in bed;in chair;with chair alarm  set;with call bell/phone within reach (With LE's elevated) Nurse Communication: Mobility status PT Visit Diagnosis: Unsteadiness on feet (R26.81);Muscle weakness (generalized) (M62.81);Difficulty in walking, not elsewhere classified (R26.2);Pain Pain - Right/Left: Right Pain - part of body: Leg;Ankle and joints of foot     Time: 1140-1207 PT Time  Calculation (min) (ACUTE ONLY): 27 min  Charges:  $Therapeutic Exercise: 23-37 mins                    G Codes:       Edward Summers B. Migdalia Dk PT, DPT Acute Rehabilitation  [phone] Pager [phone]     [place] 09/01/2016, 1:11 PM

## 2016-09-01 NOTE — Progress Notes (Signed)
Progress Note  Patient Name: Edward Summers Date of Encounter: 09/01/2016  Primary Cardiologist: Dr. Debara Pickett  Subjective   Has some dyspnea; no chest pain; hemoptysis last PM.   Inpatient Medications    Scheduled Meds: . amiodarone  200 mg Oral BID  . apixaban  5 mg Oral BID  . atorvastatin  40 mg Oral q1800  . dextromethorphan-guaiFENesin  1 tablet Oral BID  . docusate sodium  100 mg Oral Daily  . feeding supplement  1 Container Oral TID BM  . metoprolol tartrate  25 mg Oral BID  . mometasone-formoterol  2 puff Inhalation BID  . pantoprazole  40 mg Oral Daily  . predniSONE  30 mg Oral Q breakfast  . sodium chloride flush  3 mL Intravenous Q12H   Continuous Infusions: . sodium chloride    . sodium chloride 125 mL/hr at 08/21/16 2045  . sodium chloride    . lactated ringers 10 mL/hr at 08/21/16 1709  . lactated ringers 10 mL/hr at 08/28/16 1126  . magnesium sulfate 1 - 4 g bolus IVPB     PRN Meds: sodium chloride, sodium chloride, acetaminophen **OR** acetaminophen, acetaminophen, alum & mag hydroxide-simeth, bisacodyl, guaiFENesin-dextromethorphan, hydrALAZINE, hydrALAZINE, ipratropium, labetalol, levalbuterol, magnesium sulfate 1 - 4 g bolus IVPB, ondansetron (ZOFRAN) IV, oxyCODONE-acetaminophen, phenol, potassium chloride, senna-docusate, sodium chloride flush   Vital Signs    Vitals:   08/31/16 2044 08/31/16 2134 09/01/16 0419 09/01/16 0807  BP:   (!) 109/51   Pulse: 60 62 (!) 51 (!) 56  Resp:  20 16 17   Temp:   98.7 F (37.1 C)   TempSrc:      SpO2:  96% 93% 95%  Weight:      Height:        Intake/Output Summary (Last 24 hours) at 09/01/16 1353 Last data filed at 09/01/16 0800  Gross per 24 hour  Intake              565 ml  Output              640 ml  Net              -75 ml   Filed Weights   08/21/16 1716 08/21/16 2258 08/28/16 1123  Weight: 62.6 kg (138 lb) 71.2 kg (156 lb 14.4 oz) 70.8 kg (156 lb)    Telemetry    Sinus- Personally  Reviewed   Physical Exam   GEN: WD/WN NAD Neck: supple Cardiac: RRR Respiratory: diminished BS RLL GI: soft, nontender, nondistended, no masses MS: BKA, 1+ edema on left Neuro:  No focal findings   Labs    Chemistry  Recent Labs Lab 08/28/16 0320  NA 130*  K 3.5  CL 101  CO2 25  GLUCOSE 93  BUN 9  CREATININE 0.63  CALCIUM 7.4*  GFRNONAA >60  GFRAA >60  ANIONGAP 4*     Hematology  Recent Labs Lab 08/30/16 0326 08/31/16 0521 09/01/16 0226  WBC 24.3* 27.5* 25.7*  RBC 3.46* 3.22* 3.27*  HGB 9.7* 9.2* 9.4*  HCT 29.5* 27.7* 28.1*  MCV 85.3 86.0 85.9  MCH 28.0 28.6 28.7  MCHC 32.9 33.2 33.5  RDW 17.2* 17.4* 17.4*  PLT 176 220 187     Radiology    Dg Chest Port 1 View  Result Date: 08/31/2016 CLINICAL DATA:  Acute respiratory distress.  Productive cough. EXAM: PORTABLE CHEST 1 VIEW COMPARISON:  Radiograph 08/23/2016, additional priors FINDINGS: Progressive volume loss in the right lower lobe  with increased right basilar opacity. Improved left basilar aeration. Unchanged heart size and mediastinal contours. No pulmonary edema. No pneumothorax. IMPRESSION: Progressive volume loss in the right lower lobe with increased basilar opacity. This may be due to increased pleural effusion or atelectasis. Recommend continued radiographic follow-up to resolution. Improving left basilar aeration. Electronically Signed   By: Jeb Levering M.D.   On: 08/31/2016 23:07    Cardiac Studies   Echocardiogram 08/1716 - Left ventricle: The cavity size was normal. Systolic function was   normal. The estimated ejection fraction was in the range of 60%   to 65%. Wall motion was normal; there were no regional wall   motion abnormalities. - Aortic valve: Transvalvular velocity was within the normal range.   There was no stenosis. There was no regurgitation. - Mitral valve: Transvalvular velocity was within the normal range.   There was no evidence for stenosis. There was no  regurgitation. - Right ventricle: The cavity size was normal. Wall thickness was   normal. Systolic function was normal. - Atrial septum: No defect or patent foramen ovale was identified   by color flow Doppler. - Tricuspid valve: There was no regurgitation.  Patient Profile     76 y.o. 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 Continue amiodarone 200 mg twice a day for 2 weeks then decrease to 200 mg daily. Can likely discontinue in 8 weeks if patient remains in sinus rhythm. Continue apixaban; possible embolic event to lower extremity this will need to be long-term. If he has recurrent hemoptysis would plan to hold this medication until hemoptysis completely resolves and is evaluated further. Continue metoprolol.   2 Hemoptysis-will continue anticoagulation for now. If he has recurrence would need to hold. Would also need further evaluation and possible pulmonary consult.  3 Peripheral vascular disease-Status post BKA. Continue statin.  4 postoperative volume excess-some lower extremity edema noted. Discontinue IV fluids. I will give Lasix 40 mg daily. Follow renal function.  Signed, Kirk Ruths, MD  09/01/2016, 1:53 PM

## 2016-09-01 NOTE — Progress Notes (Addendum)
[employer] Medicare approval for inpt rehab admit is being pursued. I am working on a peer to peer review with my rehab MD and Insurance MD at this time. [phone] I have requested updated P.T. For tomorrow morning as pt requested not getting out of bed today. Insurance will need updated activity out of bed before proceeding further.

## 2016-09-01 NOTE — Progress Notes (Addendum)
Progress Note  SUBJECTIVE:    Had another episode of hemoptysis last night. No further episodes this am. Feels like he has to cough up mucus. Reports breathing as "heavy."  Denies chest pain.   OBJECTIVE:   Vitals:   09/01/16 0419 09/01/16 0807  BP: (!) 109/51   Pulse: (!) 51 (!) 56  Resp: 16 17  Temp: 98.7 F (37.1 C)   SpO2: 93% 95%    Intake/Output Summary (Last 24 hours) at 09/01/16 0812 Last data filed at 09/01/16 0400  Gross per 24 hour  Intake              565 ml  Output             1040 ml  Net             -475 ml   Pulm: Non labored breathing. Rhonchi. Sp02 95% RA CV: RRR Vascular: Right BKA stapled incision clean. Minimal serosanguinous drainage on dressing.   ASSESSMENT/PLAN:   76 y.o. male is s/p: right BKA 4 Days Post-Op   Had another episode of hemoptysis last night. Currently on heparin only. No further episodes of hemoptysis this am. Dr. Donzetta Matters to decide when to restart Eliquis.  CXR showing increased opacity RLL, atelectasis versus pleural effusion. Continue incentive spirometry. Will order flutter valve.  Right BKA healing well.  Currently in NSR. Had afib with RVR on 08/30/16. Appreciate cardiology help.  PT/OT currently recommending CIR. CIR consult pending.  Alvia Grove 09/01/2016 8:12 AM -- LABS:   CBC    Component Value Date/Time   WBC 25.7 (H) 09/01/2016 0226   HGB 9.4 (L) 09/01/2016 0226   HCT 28.1 (L) 09/01/2016 0226   PLT 187 09/01/2016 0226    BMET    Component Value Date/Time   NA 130 (L) 08/28/2016 0320   K 3.5 08/28/2016 0320   CL 101 08/28/2016 0320   CO2 25 08/28/2016 0320   GLUCOSE 93 08/28/2016 0320   BUN 9 08/28/2016 0320   CREATININE 0.63 08/28/2016 0320   CALCIUM 7.4 (L) 08/28/2016 0320   GFRNONAA >60 08/28/2016 0320   GFRAA >60 08/28/2016 0320    COAG Lab Results  Component Value Date   INR 2.07 08/25/2016   INR 1.14 08/21/2016   No results found for: PTT  ANTIBIOTICS:   Anti-infectives    Start      Dose/Rate Route Frequency Ordered Stop   08/28/16 0000  ceFAZolin (ANCEF) IVPB 1 g/50 mL premix    Comments:  Send with pt to OR   1 g 100 mL/hr over 30 Minutes Intravenous On call 08/27/16 1039 08/28/16 1258   08/23/16 1345  levofloxacin (LEVAQUIN) tablet 750 mg     750 mg Oral Daily 08/23/16 1344 08/28/16 0959   08/21/16 2359  cefUROXime (ZINACEF) 1.5 g in dextrose 5 % 50 mL IVPB     1.5 g 100 mL/hr over 30 Minutes Intravenous Every 12 hours 08/21/16 2254 08/22/16 1202   08/21/16 2300  doxycycline (VIBRA-TABS) tablet 100 mg  Status:  Discontinued     100 mg Oral Every 12 hours 08/21/16 2132 08/23/16 1344       Virgina Jock, PA-C [facility] Office: [phone] Pager: [phone] 09/01/2016 8:12 AM  I have independently interviewed patient and agree with PA assessment and plan above. Amputation site healing well with bruising. Will check u/a given leukocytosis. Restart eliquis today.  Kaja Jackowski C. Donzetta Matters, MD [facility] [place] Office: [phone]  Pager: [phone]

## 2016-09-01 NOTE — Significant Event (Signed)
Rapid Response Event Note RN called for violent coughing spell Overview: Time Called: 2240 Arrival Time: 2240 Event Type: Respiratory  Initial Focused Assessment: On arrival pt sitting upright in bed, coughing up copious amounts of bloody sputum. Skin warm and dry. Lungs wet sounding through out.  Sats 90-93 on room air, pt refused to use O2 at 2 L [place]  Interventions: CXR Plan of Care (if not transferred): Continue Heparin gtt, give PRN cough suppressant meds and breathing treatment. Continue to monitor pt, call RRT for any needs.  Event Summary: Name of Physician Notified: Dr. Bridgett Larsson  at 2245    at    Outcome: Stayed in room and stabalized     [place], [place]

## 2016-09-02 ENCOUNTER — Inpatient Hospital Stay (HOSPITAL_COMMUNITY): Payer: Medicare HMO

## 2016-09-02 LAB — BASIC METABOLIC PANEL
Anion gap: 9 (ref 5–15)
BUN: 7 mg/dL (ref 6–20)
CHLORIDE: 98 mmol/L — AB (ref 101–111)
CO2: 23 mmol/L (ref 22–32)
Calcium: 7.2 mg/dL — ABNORMAL LOW (ref 8.9–10.3)
Creatinine, Ser: 0.59 mg/dL — ABNORMAL LOW (ref 0.61–1.24)
GFR calc non Af Amer: >60 mL/min (ref >60)
Glucose, Bld: 95 mg/dL (ref 65–99)
POTASSIUM: 3.1 mmol/L — AB (ref 3.5–5.1)
SODIUM: 130 mmol/L — AB (ref 135–145)

## 2016-09-02 LAB — CBC
HEMATOCRIT: 28.5 % — AB (ref 39.0–52.0)
HEMOGLOBIN: 9.4 g/dL — AB (ref 13.0–17.0)
MCH: 28.3 pg (ref 26.0–34.0)
MCHC: 33 g/dL (ref 30.0–36.0)
MCV: 85.8 fL (ref 78.0–100.0)
Platelets: 172 10*3/uL (ref 150–400)
RBC: 3.32 MIL/uL — AB (ref 4.22–5.81)
RDW: 17.2 % — ABNORMAL HIGH (ref 11.5–15.5)
WBC: 22.1 10*3/uL — ABNORMAL HIGH (ref 4.0–10.5)

## 2016-09-02 MED ORDER — METOPROLOL TARTRATE 12.5 MG HALF TABLET
12.5000 mg | ORAL_TABLET | Freq: Two times a day (BID) | ORAL | Status: DC
  Administered 2016-09-02 – 2016-09-10 (×10): 12.5 mg via ORAL
  Filled 2016-09-02 (×17): qty 1

## 2016-09-02 MED ORDER — PREDNISONE 10 MG PO TABS
10.0000 mg | ORAL_TABLET | Freq: Every day | ORAL | Status: AC
  Administered 2016-09-06 – 2016-09-08 (×3): 10 mg via ORAL
  Filled 2016-09-02 (×4): qty 1

## 2016-09-02 MED ORDER — POTASSIUM CHLORIDE CRYS ER 20 MEQ PO TBCR
60.0000 meq | EXTENDED_RELEASE_TABLET | Freq: Once | ORAL | Status: AC
  Administered 2016-09-02: 60 meq via ORAL
  Filled 2016-09-02: qty 3

## 2016-09-02 MED ORDER — PREDNISONE 20 MG PO TABS
20.0000 mg | ORAL_TABLET | Freq: Every day | ORAL | Status: AC
  Administered 2016-09-03 – 2016-09-05 (×3): 20 mg via ORAL
  Filled 2016-09-02 (×3): qty 1

## 2016-09-02 NOTE — Progress Notes (Signed)
Per insurance check for Eliquis-  # 6.  S/W [facility] @ [facility] RX # [phone]    1. ELIQUIS 5 MG BID   COVER- YES  CO-PAY- $ 44.13  PRIOR APPROVAL- NO   2. ELIQUIS 2.5 MG BID   COVER- YES  CO-PAY- $ 44.13  PRIOR APPROVAL- NO   DEDUCTIBLE- NO   PREFERRED PHARMACY : [facility], [facility] , [facility]

## 2016-09-02 NOTE — Care Management Important Message (Signed)
Important Message  Patient Details  Name: Edward Summers MRN: [id number] Date of Birth: 1940/01/11   Medicare Important Message Given:  Yes    Nathen May 09/02/2016, 10:11 AM

## 2016-09-02 NOTE — Progress Notes (Signed)
Progress Note  Patient Name: Edward Summers Date of Encounter: 09/02/2016  Primary Cardiologist: Dr. Debara Pickett  Subjective   No chest pain or palpitations. Denies any recurrent hemoptysis. Reports mild dyspnea at rest.   Inpatient Medications    Scheduled Meds: . amiodarone  200 mg Oral BID  . apixaban  5 mg Oral BID  . atorvastatin  40 mg Oral q1800  . dextromethorphan-guaiFENesin  1 tablet Oral BID  . docusate sodium  100 mg Oral Daily  . feeding supplement  1 Container Oral TID BM  . furosemide  40 mg Oral Daily  . metoprolol tartrate  25 mg Oral BID  . mometasone-formoterol  2 puff Inhalation BID  . pantoprazole  40 mg Oral Daily  . predniSONE  30 mg Oral Q breakfast  . sodium chloride flush  3 mL Intravenous Q12H   Continuous Infusions: . sodium chloride    . sodium chloride    . lactated ringers 10 mL/hr at 08/21/16 1709  . lactated ringers 10 mL/hr at 08/28/16 1126  . magnesium sulfate 1 - 4 g bolus IVPB     PRN Meds: sodium chloride, sodium chloride, acetaminophen **OR** acetaminophen, acetaminophen, alum & mag hydroxide-simeth, bisacodyl, guaiFENesin-dextromethorphan, hydrALAZINE, hydrALAZINE, ipratropium, labetalol, levalbuterol, magnesium sulfate 1 - 4 g bolus IVPB, ondansetron (ZOFRAN) IV, oxyCODONE-acetaminophen, phenol, potassium chloride, senna-docusate, sodium chloride flush   Vital Signs    Vitals:   09/01/16 2138 09/02/16 0339 09/02/16 0400 09/02/16 0743  BP:  (!) 108/56 (!) 91/54 (!) 102/55  Pulse: (!) 55  (!) 47 (!) 51  Resp: 20  18 (!) 22  Temp:   98.4 F (36.9 C) 97.9 F (36.6 C)  TempSrc:   Oral Oral  SpO2: 94%  93% 92%  Weight:      Height:        Intake/Output Summary (Last 24 hours) at 09/02/16 0753 Last data filed at 09/02/16 0300  Gross per 24 hour  Intake              340 ml  Output             1375 ml  Net            -1035 ml   Filed Weights   08/21/16 1716 08/21/16 2258 08/28/16 1123  Weight: 138 lb (62.6 kg) 156 lb 14.4 oz  (71.2 kg) 156 lb (70.8 kg)    Telemetry    Sinus bradycardia, HR in the mid-40's to 50's.  - Personally Reviewed  ECG   No new tracings.   Physical Exam   General: Well developed, well nourished Caucasian male appearing in no acute distress. Head: Normocephalic, atraumatic.  Neck: Supple without bruits, JVD not elevated. Lungs:  Resp regular and unlabored, mild rales along right base. Heart: RRR, S1, S2, no S3, S4, or murmur; no rub. Abdomen: Soft, non-tender, non-distended with normoactive bowel sounds. No hepatomegaly. No rebound/guarding. No obvious abdominal masses. Extremities: Right BKA. 1+ pitting edema up to knee on the left.  Neuro: Alert and oriented X 3. Moves all extremities spontaneously. Psych: Normal affect.  Labs    Chemistry Recent Labs Lab 08/28/16 0320 09/02/16 0345  NA 130* 130*  K 3.5 3.1*  CL 101 98*  CO2 25 23  GLUCOSE 93 95  BUN 9 7  CREATININE 0.63 0.59*  CALCIUM 7.4* 7.2*  GFRNONAA >60 >60  GFRAA >60 >60  ANIONGAP 4* 9     Hematology Recent Labs Lab 08/31/16 [phone] 09/01/16 0017  09/02/16 0345  WBC 27.5* 25.7* 22.1*  RBC 3.22* 3.27* 3.32*  HGB 9.2* 9.4* 9.4*  HCT 27.7* 28.1* 28.5*  MCV 86.0 85.9 85.8  MCH 28.6 28.7 28.3  MCHC 33.2 33.5 33.0  RDW 17.4* 17.4* 17.2*  PLT 220 187 172    Cardiac EnzymesNo results for input(s): TROPONINI in the last 168 hours. No results for input(s): TROPIPOC in the last 168 hours.   BNPNo results for input(s): BNP, PROBNP in the last 168 hours.   DDimer No results for input(s): DDIMER in the last 168 hours.   Radiology    Dg Chest Port 1 View  Result Date: 08/31/2016 CLINICAL DATA:  Acute respiratory distress.  Productive cough. EXAM: PORTABLE CHEST 1 VIEW COMPARISON:  Radiograph 08/23/2016, additional priors FINDINGS: Progressive volume loss in the right lower lobe with increased right basilar opacity. Improved left basilar aeration. Unchanged heart size and mediastinal contours. No pulmonary  edema. No pneumothorax. IMPRESSION: Progressive volume loss in the right lower lobe with increased basilar opacity. This may be due to increased pleural effusion or atelectasis. Recommend continued radiographic follow-up to resolution. Improving left basilar aeration. Electronically Signed   By: Jeb Levering M.D.   On: 08/31/2016 23:07    Cardiac Studies   Echocardiogram: 08/2016 Study Conclusions  - Left ventricle: The cavity size was normal. Systolic function was   normal. The estimated ejection fraction was in the range of 60%   to 65%. Wall motion was normal; there were no regional wall   motion abnormalities. - Aortic valve: Transvalvular velocity was within the normal range.   There was no stenosis. There was no regurgitation. - Mitral valve: Transvalvular velocity was within the normal range.   There was no evidence for stenosis. There was no regurgitation. - Right ventricle: The cavity size was normal. Wall thickness was   normal. Systolic function was normal. - Atrial septum: No defect or patent foramen ovale was identified   by color flow Doppler. - Tricuspid valve: There was no regurgitation.  Patient Profile     76 y.o. male with PMH of HTN and HLD admitted for acute limb ischemia. Cardiology consulted for atrial fibrillation with RVR.   Assessment & Plan    1. Post-operative Atrial Fibrillation - the patient is remaining in NSR. Will continue on Amiodarone 200 mg BID for 2 weeks then decrease to 200 mg daily. Can likely discontinue in 8 weeks if patient remains in sinus rhythm. - HR has been in the mid-40's overnight. Will reduce Lopressor from 25mg  BID to 12.5mg  BID.  - has been started on Eliquis 5mg  BID as he was thought to have a possible embolic event to lower extremity, therefore will need to be on long-term anticoagulation.   2. Hemoptysis - he denies any recent recurrence. Continue with Eliquis at this time. If he has a recurrent event, recommend Pulmonary  evaluation.   3. 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 - continue statin therapy. No ASA secondary to need for Eliquis.  - Vascular Surgery following.    4. Postoperative volume excess - noted to have lower extremity edema, therefore IVF were discontinued and he was started on PO Lasix 40mg  daily yesterday with a net output > 1.0L. He still has pitting edema on examination, therefore will continue with Lasix at this time.   5. Hypokalemia - K+ 3.1 this AM. Will replace.  - repeat BMET in AM.    ADDENDUM: The patient had a brief run of what appears  most consistent with SVT after I left the room. Continue with K+ replacement as above along with BB therapy. Will check Mg.   Signed, Erma Heritage , PA-C 7:53 AM 09/02/2016 Pager: [phone] As above, patient seen and examined. Patient denies dyspnea or chest pain. Telemetry personally reviewed. He had transient PAF. Continue amiodarone as outlined above. Decrease metoprolol because of bradycardia. Continue apixaban. Patient's chest x-ray suggests mass. Would arrange CT scan to further assess. Will leave to primary service. If he has recurrent hemoptysis would discontinue anticoagulation until question pulmonary mass is evaluated. Continue present dose of Lasix for volume excess. Kirk Ruths, MD

## 2016-09-02 NOTE — Progress Notes (Signed)
Physical Therapy Treatment Patient Details Name: Edward Summers MRN: [id number] DOB: 07-27-1940 Today's Date: 09/02/2016    History of Present Illness 76 year old male with history of hypertension, hyperlipidemia, emphysema who was admitted by vascular surgery for acute right leg ischemia and underwent surgical thromboembolectomy of right popliteal, PT and AT arteries. Hospitalist was consulted because patient developed postprocedure atrial fibrillation with rapid ventricular rate and hypotension. Cardiology was also consulted for the same. 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   PMH:  HLD, HTN, COPD    PT Comments    Pt is feeling much better today and is able to make good progress towards his goals today. Pt currently supervision for bed mobility, and min Ax2 for transfers and hop-to ambulation of 4 feet with RW. Pt requires skilled PT to continue to progress mobility and to maximize strength and endurance to safely mobilize in his discharge environment.    Follow Up Recommendations  CIR     Equipment Recommendations  Other (comment) (to be determined at next venue)       Precautions / Restrictions Precautions Precautions: Fall Restrictions Weight Bearing Restrictions: Yes RLE Weight Bearing: Non weight bearing    Mobility  Bed Mobility Overal bed mobility: Needs Assistance Bed Mobility: Supine to Sit     Supine to sit: Supervision     General bed mobility comments: min guard for safety able to scoot to EoB with bedrail assist  Transfers Overall transfer level: Needs assistance Equipment used: Rolling walker (2 wheeled) Transfers: Sit to/from [employer] Transfers Sit to Stand: +2 physical assistance;Min assist         General transfer comment: minAx2 for power up and steadying with vc for hand placement for powerup, anterior pelvic tilt and looking up and out to come all the way to standing  Ambulation/Gait Ambulation/Gait assistance: Min assist;+2  safety/equipment Ambulation Distance (Feet): 4 Feet Assistive device: Rolling walker (2 wheeled) Gait Pattern/deviations:  (hop to pattern) Gait velocity: slow Gait velocity interpretation: Below normal speed for age/gender General Gait Details: minAx2 for steadying, pt able to hop 3 feet forward, pivot on L LE and hop 1 foot back to sit in recliner with safe eccentrically controlled descent into chair       Balance Overall balance assessment: Needs assistance Sitting-balance support: No upper extremity supported;Feet unsupported Sitting balance-Leahy Scale: Good     Standing balance support: Bilateral upper extremity supported Standing balance-Leahy Scale: Poor Standing balance comment: RW for support                            Cognition Arousal/Alertness: Awake/alert Behavior During Therapy: WFL for tasks assessed/performed Overall Cognitive Status: Within Functional Limits for tasks assessed                                        Exercises General Exercises - Lower Extremity Long Arc Quad: AROM;Both;10 reps;Seated Amputee Exercises Hip ABduction/ADduction: AROM;Both;10 reps;Seated Hip Flexion/Marching: AROM;Both;10 reps;Seated Knee Flexion: AROM;Right;10 reps;Supine Knee Extension: AROM;Right;10 reps;Seated Other Exercises Other Exercises: sheet stretch of L gastroc, 5x 10 sec hold        Pertinent Vitals/Pain Pain Assessment: Faces Faces Pain Scale: Hurts little more Pain Location: L LE with standing Pain Descriptors / Indicators: Grimacing;Sore;Guarding Pain Intervention(s): Limited activity within patient's tolerance;Monitored during session           PT Goals (current  goals can now be found in the care plan section) Acute Rehab PT Goals PT Goal Formulation: With patient Time For Goal Achievement: 09/05/16 Potential to Achieve Goals: Good Progress towards PT goals: Progressing toward goals    Frequency    Min  3X/week      PT Plan Current plan remains appropriate       AM-PAC PT "6 Clicks" Daily Activity  Outcome Measure  Difficulty turning over in bed (including adjusting bedclothes, sheets and blankets)?: None Difficulty moving from lying on back to sitting on the side of the bed? : A Lot Difficulty sitting down on and standing up from a chair with arms (e.g., wheelchair, bedside commode, etc,.)?: Unable Help needed moving to and from a bed to chair (including a wheelchair)?: A Little Help needed walking in hospital room?: A Lot Help needed climbing 3-5 steps with a railing? : Total 6 Click Score: 13    End of Session Equipment Utilized During Treatment: Gait belt Activity Tolerance: Patient tolerated treatment well Patient left: in chair;with chair alarm set;with call bell/phone within reach (With LE's elevated) Nurse Communication: Mobility status PT Visit Diagnosis: Unsteadiness on feet (R26.81);Muscle weakness (generalized) (M62.81);Difficulty in walking, not elsewhere classified (R26.2);Pain Pain - Right/Left: Right Pain - part of body: Leg;Ankle and joints of foot     Time: 9381-0175 PT Time Calculation (min) (ACUTE ONLY): 28 min  Charges:  $Therapeutic Exercise: 8-22 mins $Therapeutic Activity: 8-22 mins                    G Codes:       Effa Yarrow B. Migdalia Dk PT, DPT Acute Rehabilitation  [phone] Pager [phone]     [place] 09/02/2016, 10:49 AM

## 2016-09-02 NOTE — Progress Notes (Addendum)
  Progress Note  SUBJECTIVE:    Still having some difficulty breathing.   OBJECTIVE:   Vitals:   09/02/16 0339 09/02/16 0400  BP: (!) 108/56 (!) 91/54  Pulse:  (!) 47  Resp:  18  Temp:  98.4 F (36.9 C)  SpO2:  93%    Intake/Output Summary (Last 24 hours) at 09/02/16 0736 Last data filed at 09/02/16 0300  Gross per 24 hour  Intake              340 ml  Output             1375 ml  Net            -1035 ml   Lungs with rales right and expiratory wheezing.  Right BKA with mild serous drainage on dressing. Left leg with 2+ pitting edema  ASSESSMENT/PLAN:   76 y.o. male is s/p: right BKA 5 Days Post-Op   Right BKA: healing well.  Atrial fibrillation: continues to be in NSR. Eliquis restarted yesterday. On amiodarone. No further hemoptysis. Appreciate cardiology assistance.  Dyspnea and wheezing: breathing treatment this am. Lasix 40 mg po again today. Incentive spirometry and flutter valve.  Leukocytosis: WBC trending down to 22.1 today. UA negative. Will recheck CXR tomorrow.  Dispo: d/c to CIR when medically stable.   Alvia [place] 09/02/2016 7:36 AM -- LABS:   CBC    Component Value Date/Time   WBC 22.1 (H) 09/02/2016 0345   HGB 9.4 (L) 09/02/2016 0345   HCT 28.5 (L) 09/02/2016 0345   PLT 172 09/02/2016 0345    BMET    Component Value Date/Time   NA 130 (L) 09/02/2016 0345   K 3.1 (L) 09/02/2016 0345   CL 98 (L) 09/02/2016 0345   CO2 23 09/02/2016 0345   GLUCOSE 95 09/02/2016 0345   BUN 7 09/02/2016 0345   CREATININE 0.59 (L) 09/02/2016 0345   CALCIUM 7.2 (L) 09/02/2016 0345   GFRNONAA >60 09/02/2016 0345   GFRAA >60 09/02/2016 0345    COAG Lab Results  Component Value Date   INR 2.07 08/25/2016   INR 1.14 08/21/2016   No results found for: PTT  ANTIBIOTICS:   Anti-infectives    Start     Dose/Rate Route Frequency Ordered Stop   08/28/16 0000  ceFAZolin (ANCEF) IVPB 1 g/50 mL premix    Comments:  Send with pt to OR   1 g 100 mL/hr  over 30 Minutes Intravenous On call 08/27/16 1039 08/28/16 1258   08/23/16 1345  levofloxacin (LEVAQUIN) tablet 750 mg     750 mg Oral Daily 08/23/16 1344 08/28/16 0959   08/21/16 2359  cefUROXime (ZINACEF) 1.5 g in dextrose 5 % 50 mL IVPB     1.5 g 100 mL/hr over 30 Minutes Intravenous Every 12 hours 08/21/16 2254 08/22/16 1202   08/21/16 2300  doxycycline (VIBRA-TABS) tablet 100 mg  Status:  Discontinued     100 mg Oral Every 12 hours 08/21/16 2132 08/23/16 [place], PA-C [facility] Office: [phone] Pager: [phone] 09/02/2016 7:36 AM    I have independently interviewed and examined patient and agree with PA assessment and plan above.   Jazelle Achey C. Donzetta Matters, MD [facility] [place] Office: [phone] Pager: [phone]

## 2016-09-02 NOTE — Progress Notes (Signed)
Occupational Therapy Treatment Patient Details Name: Edward Summers MRN: [id number] DOB: Mar 22, 1940 Today's Date: 09/02/2016    History of present illness 76 year old male with history of hypertension, hyperlipidemia, emphysema who was admitted by vascular surgery for acute right leg ischemia and underwent surgical thromboembolectomy of right popliteal, PT and AT arteries. Hospitalist was consulted because patient developed postprocedure atrial fibrillation with rapid ventricular rate and hypotension. Cardiology was also consulted for the same. 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   PMH:  HLD, HTN, COPD   OT comments  Pt able to don/doff sock with min A today.  Requires mod A to stand for LB ADLs.  He is able to perform UB ADLs with set up/supervision, but requires max A for LB ADLs.  He is very motivated to improve and will benefit from the intensity of CIR to allow him to regain his independence with ADLs so he can return home with intermittent assist from family.    Follow Up Recommendations  CIR    Equipment Recommendations  3 in 1 bedside commode    Recommendations for Other Services Rehab consult    Precautions / Restrictions Precautions Precautions: Fall Restrictions Weight Bearing Restrictions: Yes RLE Weight Bearing: Non weight bearing       Mobility Bed Mobility Overal bed mobility: Needs Assistance Bed Mobility: Supine to Sit     Supine to sit: Supervision     General bed mobility comments: sitting in chair   Transfers Overall transfer level: Needs assistance Equipment used: Rolling walker (2 wheeled) Transfers: Sit to/from Stand Sit to Stand: Mod assist         General transfer comment: verbal cues for hand placement and assist to boost into standing.  Pt with difficulty extending hip and knee     Balance Overall balance assessment: Needs assistance Sitting-balance support: No upper extremity supported;Feet unsupported Sitting balance-Leahy Scale: Good      Standing balance support: Bilateral upper extremity supported Standing balance-Leahy Scale: Poor Standing balance comment: heavy reliance on bil. UE support                            ADL either performed or assessed with clinical judgement   ADL Overall ADL's : Needs assistance/impaired Eating/Feeding: Set up;Sitting   Grooming: Wash/dry hands;Wash/dry face;Oral care;Brushing hair;Set up;Sitting   Upper Body Bathing: Set up;Sitting   Lower Body Bathing: Moderate assistance;Sit to/from stand   Upper Body Dressing : Supervision/safety;Set up;Sitting   Lower Body Dressing: Maximal assistance;Sit to/from stand Lower Body Dressing Details (indicate cue type and reason): Pt able to don/doff Lt sock with min A, seated, and mod A to move sit to stand, but unable to release UE to pull pants over hips  Toilet Transfer: Total assistance   Toileting- Clothing Manipulation and Hygiene: Total assistance;Sit to/from stand       Functional mobility during ADLs: Moderate assistance;Maximal assistance       Vision       Perception     Praxis      Cognition Arousal/Alertness: Awake/alert Behavior During Therapy: WFL for tasks assessed/performed Overall Cognitive Status: Within Functional Limits for tasks assessed                                          Exercises Exercises: Other exercises;Amputee;General Lower Extremity General Exercises - Lower Extremity Long Arc Quad: AROM;Both;10  reps;Seated Amputee Exercises Hip ABduction/ADduction: AROM;Both;10 reps;Seated Hip Flexion/Marching: AROM;Both;10 reps;Seated Knee Flexion: AROM;Right;10 reps;Supine Knee Extension: AROM;Right;10 reps;Seated Other Exercises Other Exercises: sheet stretch of L gastroc, 5x 10 sec hold   Shoulder Instructions       General Comments VSS    Pertinent Vitals/ Pain       Pain Assessment: Faces Faces Pain Scale: Hurts a little bit Pain Location: L LE with  standing Pain Descriptors / Indicators: Grimacing;Sore;Guarding Pain Intervention(s): Monitored during session;Limited activity within patient's tolerance  Home Living                                          Prior Functioning/Environment              Frequency  Min 2X/week        Progress Toward Goals  OT Goals(current goals can now be found in the care plan section)  Progress towards OT goals: Progressing toward goals     Plan Discharge plan remains appropriate    Co-evaluation                 AM-PAC PT "6 Clicks" Daily Activity     Outcome Measure   Help from another person eating meals?: None Help from another person taking care of personal grooming?: A Little Help from another person toileting, which includes using toliet, bedpan, or urinal?: A Lot Help from another person bathing (including washing, rinsing, drying)?: A Lot Help from another person to put on and taking off regular upper body clothing?: A Little Help from another person to put on and taking off regular lower body clothing?: A Lot 6 Click Score: 16    End of Session Equipment Utilized During Treatment: Rolling walker  OT Visit Diagnosis: Unsteadiness on feet (R26.81);Other abnormalities of gait and mobility (R26.89);Pain Pain - Right/Left: Right Pain - part of body: Leg   Activity Tolerance Patient tolerated treatment well   Patient Left in chair;with call bell/phone within reach   Nurse Communication Mobility status        Time: 7654-6503 OT Time Calculation (min): 29 min  Charges: OT General Charges $OT Visit: 1 Visit OT Treatments $Self Care/Home Management : 23-37 mins  [facility], OTR/L [phone]    Lucille Passy M 09/02/2016, 11:19 AM

## 2016-09-03 ENCOUNTER — Inpatient Hospital Stay (HOSPITAL_COMMUNITY): Payer: Medicare HMO

## 2016-09-03 LAB — BASIC METABOLIC PANEL
ANION GAP: 5 (ref 5–15)
BUN: 12 mg/dL (ref 6–20)
CHLORIDE: 100 mmol/L — AB (ref 101–111)
CO2: 25 mmol/L (ref 22–32)
Calcium: 7.4 mg/dL — ABNORMAL LOW (ref 8.9–10.3)
Creatinine, Ser: 0.65 mg/dL (ref 0.61–1.24)
GFR calc Af Amer: >60 mL/min (ref >60)
GFR calc non Af Amer: >60 mL/min (ref >60)
GLUCOSE: 94 mg/dL (ref 65–99)
POTASSIUM: 5 mmol/L (ref 3.5–5.1)
Sodium: 130 mmol/L — ABNORMAL LOW (ref 135–145)

## 2016-09-03 LAB — CBC
HEMATOCRIT: 28.5 % — AB (ref 39.0–52.0)
HEMOGLOBIN: 9.9 g/dL — AB (ref 13.0–17.0)
MCH: 29.7 pg (ref 26.0–34.0)
MCHC: 34.7 g/dL (ref 30.0–36.0)
MCV: 85.6 fL (ref 78.0–100.0)
Platelets: 211 10*3/uL (ref 150–400)
RBC: 3.33 MIL/uL — AB (ref 4.22–5.81)
RDW: 17.7 % — ABNORMAL HIGH (ref 11.5–15.5)
WBC: 23.3 10*3/uL — ABNORMAL HIGH (ref 4.0–10.5)

## 2016-09-03 LAB — MAGNESIUM: Magnesium: 1.9 mg/dL (ref 1.7–2.4)

## 2016-09-03 MED ORDER — IOPAMIDOL (ISOVUE-300) INJECTION 61%
INTRAVENOUS | Status: AC
  Administered 2016-09-03: 75 mL
  Filled 2016-09-03: qty 75

## 2016-09-03 NOTE — Progress Notes (Signed)
Patient refused low-air loss mattress that the [facility] nurse recommended for him for his sacral MASD. The benefits of the bed were explained to patient including the risks of developing pressure ulcer to sacrum without it if he does not continuously turn himself and he still refused. He states that he is turning himself constantly to alleviate pressure and allow air to his back side.

## 2016-09-03 NOTE — Consult Note (Signed)
[place] Nurse wound consult note Reason for Consult:Sacral partial thickness Wound type:MASD Pressure Injury POA: NA Measurement:10 cm x 10 cm area with multiple partial skin loss areas Wound SUG:AYGE Drainage (amount, consistency, odor) wet, moderate tan colored exudate Periwound: intact outside of the 10cm round area Dressing procedure/placement/frequency:I was asked to see pt to assess what may be a HAPI. I removed foam dressing that was saturated with tan exudate. Wounds appear to be MASD, pt states he is not incontinent. I have provided nurses with orders for Cleansing and using all peri care products including purple top criticaid lotion, BID and prn. I have placed Derma Therapy linen under patient to wick away moisture. I have ordered a low air loss mattress to help with moisture control. I will not officially follow patient but I will see Friday to monitor results. We will not follow, but will remain available to this patient, to nursing, and the medical and/or surgical teams.  Please re-consult if we need to assist further.   Fara Olden, RN-C, WTA-C, [facility] Wound Treatment Associate

## 2016-09-03 NOTE — Progress Notes (Addendum)
  Progress Note  SUBJECTIVE:    Still with some dyspnea. Says his bottom is sore.   OBJECTIVE:   Vitals:   09/02/16 2014 09/03/16 0452  BP: (!) 100/57 (!) 121/58  Pulse: (!) 57 60  Resp:  (!) 21  Temp: 98 F (36.7 C) 98.3 F (36.8 C)  SpO2: 99% 96%    Intake/Output Summary (Last 24 hours) at 09/03/16 0744 Last data filed at 09/02/16 1700  Gross per 24 hour  Intake              360 ml  Output              850 ml  Net             -490 ml   Normal respiratory effort. Rales on right.  Sacral decubitus sore. Right BKA clean.  Left leg pitting edema.   ASSESSMENT/PLAN:   76 y.o. male is s/p: right BKA 6 Days Post-Op   Dyspnea: CXR suggesting possible central endobronchial process. CT chest today to evaluate.  Afib: Remains in NSR. Currently on Eliquis. No further hemoptysis.  Right BKA healing well.  Still with peripheral edema. Has been getting lasix 40 mg for past two days. Potassium 5 today. Continue lasix today.  Will have WOC evaluate sacral decubitus ulcer. Have asked nursing staff to remind patient to continually turn himself.  Morning CBC pending.   Edward Summers 09/03/2016 7:44 AM -- LABS:   CBC    Component Value Date/Time   WBC 22.1 (H) 09/02/2016 0345   HGB 9.4 (L) 09/02/2016 0345   HCT 28.5 (L) 09/02/2016 0345   PLT 172 09/02/2016 0345    BMET    Component Value Date/Time   NA 130 (L) 09/03/2016 0426   K 5.0 09/03/2016 0426   CL 100 (L) 09/03/2016 0426   CO2 25 09/03/2016 0426   GLUCOSE 94 09/03/2016 0426   BUN 12 09/03/2016 0426   CREATININE 0.65 09/03/2016 0426   CALCIUM 7.4 (L) 09/03/2016 0426   GFRNONAA >60 09/03/2016 0426   GFRAA >60 09/03/2016 0426    COAG Lab Results  Component Value Date   INR 2.07 08/25/2016   INR 1.14 08/21/2016   No results found for: PTT  ANTIBIOTICS:   Anti-infectives    Start     Dose/Rate Route Frequency Ordered Stop   08/28/16 0000  ceFAZolin (ANCEF) IVPB 1 g/50 mL premix    Comments:  Send  with pt to OR   1 g 100 mL/hr over 30 Minutes Intravenous On call 08/27/16 1039 08/28/16 1258   08/23/16 1345  levofloxacin (LEVAQUIN) tablet 750 mg     750 mg Oral Daily 08/23/16 1344 08/28/16 0959   08/21/16 2359  cefUROXime (ZINACEF) 1.5 g in dextrose 5 % 50 mL IVPB     1.5 g 100 mL/hr over 30 Minutes Intravenous Every 12 hours 08/21/16 2254 08/22/16 1202   08/21/16 2300  doxycycline (VIBRA-TABS) tablet 100 mg  Status:  Discontinued     100 mg Oral Every 12 hours 08/21/16 2132 08/23/16 1344       Virgina Jock, PA-C [facility] Office: [phone] Pager: [phone] 09/03/2016 7:44 AM   I have independently interviewed patient and agree with PA assessment and plan above. [place] for rehab from vascular standpoint. Will get CT to evaluate lung lesion.  Stacey Maura C. Donzetta Matters, MD [facility] [place] Office: [phone] Pager: [phone]

## 2016-09-03 NOTE — Progress Notes (Signed)
Progress Note  Patient Name: Edward Summers Date of Encounter: 09/03/2016  Primary Cardiologist: Dr. Debara Pickett  Subjective   No chest pain or palpitations. Notes a dry cough. Reports sitting in his chair yesterday without any issues. Scheduled for Chest CT later today.   Inpatient Medications    Scheduled Meds: . amiodarone  200 mg Oral BID  . apixaban  5 mg Oral BID  . atorvastatin  40 mg Oral q1800  . dextromethorphan-guaiFENesin  1 tablet Oral BID  . docusate sodium  100 mg Oral Daily  . feeding supplement  1 Container Oral TID BM  . furosemide  40 mg Oral Daily  . metoprolol tartrate  12.5 mg Oral BID  . mometasone-formoterol  2 puff Inhalation BID  . pantoprazole  40 mg Oral Daily  . predniSONE  20 mg Oral Q breakfast   And  . [START ON 09/06/2016] predniSONE  10 mg Oral Q breakfast  . sodium chloride flush  3 mL Intravenous Q12H   Continuous Infusions: . sodium chloride    . sodium chloride    . lactated ringers 10 mL/hr at 08/21/16 1709  . lactated ringers 10 mL/hr at 08/28/16 1126  . magnesium sulfate 1 - 4 g bolus IVPB     PRN Meds: sodium chloride, sodium chloride, acetaminophen **OR** acetaminophen, acetaminophen, alum & mag hydroxide-simeth, bisacodyl, guaiFENesin-dextromethorphan, hydrALAZINE, hydrALAZINE, ipratropium, labetalol, levalbuterol, magnesium sulfate 1 - 4 g bolus IVPB, ondansetron (ZOFRAN) IV, oxyCODONE-acetaminophen, phenol, senna-docusate, sodium chloride flush   Vital Signs    Vitals:   09/02/16 0918 09/02/16 1416 09/02/16 2014 09/03/16 0452  BP:  (!) 106/54 (!) 100/57 (!) 121/58  Pulse:  (!) 59 (!) 57 60  Resp:  17  (!) 21  Temp:  (!) 97.5 F (36.4 C) 98 F (36.7 C) 98.3 F (36.8 C)  TempSrc:  Oral Oral Oral  SpO2: 93% 97% 99% 96%  Weight:      Height:        Intake/Output Summary (Last 24 hours) at 09/03/16 0744 Last data filed at 09/02/16 1700  Gross per 24 hour  Intake              360 ml  Output              850 ml  Net              -490 ml   Filed Weights   08/21/16 1716 08/21/16 2258 08/28/16 1123  Weight: 138 lb (62.6 kg) 156 lb 14.4 oz (71.2 kg) 156 lb (70.8 kg)    Telemetry    Sinus bradycardia, HR in mid-50's to 60's. No ectopic events since yesterday morning  - Personally Reviewed  ECG    No new tracings.   Physical Exam   General: Well developed, well nourished, male appearing in no acute distress. Head: Normocephalic, atraumatic.  Neck: Supple without bruits, JVD not elevated. Lungs:  Resp regular and unlabored, decreased breath sounds along right base. Heart: RRR, S1, S2, no S3, S4, or murmur; no rub. Abdomen: Soft, non-tender, non-distended with normoactive bowel sounds. No hepatomegaly. No rebound/guarding. No obvious abdominal masses. Extremities: No clubbing or cyanosis, 1+ pitting edema up to mid-shin on left. Right BKA.  Neuro: Alert and oriented X 3. Moves all extremities spontaneously. Psych: Normal affect.  Labs    Chemistry Recent Labs Lab 08/28/16 0320 09/02/16 0345 09/03/16 0426  NA 130* 130* 130*  K 3.5 3.1* 5.0  CL 101 98* 100*  CO2  25 23 25   GLUCOSE 93 95 94  BUN 9 7 12   CREATININE 0.63 0.59* 0.65  CALCIUM 7.4* 7.2* 7.4*  GFRNONAA >60 >60 >60  GFRAA >60 >60 >60  ANIONGAP 4* 9 5     Hematology Recent Labs Lab 08/31/16 0521 09/01/16 0226 09/02/16 0345  WBC 27.5* 25.7* 22.1*  RBC 3.22* 3.27* 3.32*  HGB 9.2* 9.4* 9.4*  HCT 27.7* 28.1* 28.5*  MCV 86.0 85.9 85.8  MCH 28.6 28.7 28.3  MCHC 33.2 33.5 33.0  RDW 17.4* 17.4* 17.2*  PLT 220 187 172    Cardiac EnzymesNo results for input(s): TROPONINI in the last 168 hours. No results for input(s): TROPIPOC in the last 168 hours.   BNPNo results for input(s): BNP, PROBNP in the last 168 hours.   DDimer No results for input(s): DDIMER in the last 168 hours.   Radiology    Dg Chest Port 1 View  Result Date: 09/02/2016 CLINICAL DATA:  Shortness of breath EXAM: PORTABLE CHEST 1 VIEW COMPARISON:  Chest x-ray  of 08/31/2016 and 07/04/2016 FINDINGS: The chest x-ray of 07/04/2016 shows a clear right lung base. However the persistence of right basilar volume loss in abnormality the right hilum is worrisome for possible central endobronchial process. Also there may be a small right pleural effusion present. CT of the chest with IV contrast media is recommended to assess further. The left lung is clear. Mild cardiomegaly is stable. No bony abnormality is seen. IMPRESSION: 1. Persistent abnormal opacity at the right lung base with abnormality of the right hilum and infrahilar region. Cannot exclude a central endobronchial process. Recommend CT of the chest with IV contrast media. 2. Small right pleural effusion. Electronically Signed   By: Ivar Drape M.D.   On: 09/02/2016 09:02    Cardiac Studies   Echocardiogram: 08/2016 Study Conclusions  - Left ventricle: The cavity size was normal. Systolic function was normal. The estimated ejection fraction was in the range of 60% to 65%. Wall motion was normal; there were no regional wall motion abnormalities. - Aortic valve: Transvalvular velocity was within the normal range. There was no stenosis. There was no regurgitation. - Mitral valve: Transvalvular velocity was within the normal range. There was no evidence for stenosis. There was no regurgitation. - Right ventricle: The cavity size was normal. Wall thickness was normal. Systolic function was normal. - Atrial septum: No defect or patent foramen ovale was identified by color flow Doppler. - Tricuspid valve: There was no regurgitation.  Patient Profile     76 y.o. male with PMH of HTN and HLD admitted for acute limb ischemia. Cardiology consulted for atrial fibrillation with RVR.   Assessment & Plan    1. Post-operative Atrial Fibrillation - the patient is remaining in NSR with only transient episodes of PAF. Will continue on Amiodarone 200 mg BID for 2 weeks then decrease to 200 mg  daily (Amiodarone started on 8/18 --> reduce to once daily dosing on 09/06/2016). Can likely discontinue in 8 weeks if patient remains in sinus rhythm. - HR improved to the mid-50's to 60's with dose reduction of Lopressor from 25mg  BID to 12.5mg  BID.  - has been started on Eliquis 5mg  BID as he was thought to have a possible embolic event to lower extremity, therefore will need to be on long-term anticoagulation.   2. Hemoptysis - he denies any recent recurrence. Continue with Eliquis at this time. If he has a recurrent event, recommend Pulmonary evaluation.   3. PVD -  s/p right BKA on on 08/28/2016. - continue statin therapy. No ASA secondary to need for Eliquis.  - Vascular Surgery following.    4. Postoperative volume excess - has been started on PO Lasix 40mg  daily. No weights in the system following his BKA, therefore will need to establish a new dry weight. Continue with Lasix as he still has pitting edema on examination.   5. Hypokalemia - resolved. K+ 5.0 this AM.   6. Abnormal CXR - this shows a persistent abnormal opacity at the right lung base with abnormality of the right hilum and infrahilar region. Chest CT scheduled for later today.   Signed, Erma Heritage , PA-C 7:44 AM 09/03/2016 Pager: [phone] As above, patient seen and examined. He denies chest pain or dyspnea. He remains in sinus rhythm. Continue amiodarone to complete 2 weeks at 200 mg twice a day then 200 mg daily thereafter. Can likely discontinue in 8 weeks if he remains in sinus rhythm. He should remain on apixaban long-term as his presentation may have been an embolic event related to atrial fibrillation. He remains volume overloaded. Continue present dose of Lasix and follow renal function. He is scheduled for CT scan today to evaluate lung mass.   Kirk Ruths, MD

## 2016-09-03 NOTE — Progress Notes (Addendum)
I await [facility] decision with an expedited appeal for they have currently denied an inpt rehab admission. Pt is aware as well as RN CM.  [phone]

## 2016-09-04 DIAGNOSIS — R918 Other nonspecific abnormal finding of lung field: Secondary | ICD-10-CM

## 2016-09-04 LAB — CBC
HCT: 30.2 % — ABNORMAL LOW (ref 39.0–52.0)
Hemoglobin: 9.9 g/dL — ABNORMAL LOW (ref 13.0–17.0)
MCH: 28.4 pg (ref 26.0–34.0)
MCHC: 32.8 g/dL (ref 30.0–36.0)
MCV: 86.8 fL (ref 78.0–100.0)
PLATELETS: 176 10*3/uL (ref 150–400)
RBC: 3.48 MIL/uL — ABNORMAL LOW (ref 4.22–5.81)
RDW: 17.4 % — AB (ref 11.5–15.5)
WBC: 21.1 10*3/uL — AB (ref 4.0–10.5)

## 2016-09-04 LAB — BASIC METABOLIC PANEL
Anion gap: 6 (ref 5–15)
BUN: 8 mg/dL (ref 6–20)
CALCIUM: 7.3 mg/dL — AB (ref 8.9–10.3)
CO2: 26 mmol/L (ref 22–32)
Chloride: 100 mmol/L — ABNORMAL LOW (ref 101–111)
Creatinine, Ser: 0.67 mg/dL (ref 0.61–1.24)
GFR calc Af Amer: >60 mL/min (ref >60)
GLUCOSE: 103 mg/dL — AB (ref 65–99)
Potassium: 3.4 mmol/L — ABNORMAL LOW (ref 3.5–5.1)
SODIUM: 132 mmol/L — AB (ref 135–145)

## 2016-09-04 MED ORDER — PRO-STAT SUGAR FREE PO LIQD
30.0000 mL | Freq: Two times a day (BID) | ORAL | Status: DC
  Administered 2016-09-04 – 2016-09-16 (×18): 30 mL via ORAL
  Filled 2016-09-04 (×21): qty 30

## 2016-09-04 MED ORDER — BOOST / RESOURCE BREEZE PO LIQD
1.0000 | Freq: Two times a day (BID) | ORAL | Status: DC
Start: 2016-09-05 — End: 2016-09-16
  Administered 2016-09-05 – 2016-09-15 (×9): 1 via ORAL

## 2016-09-04 MED ORDER — POTASSIUM CHLORIDE CRYS ER 20 MEQ PO TBCR
20.0000 meq | EXTENDED_RELEASE_TABLET | Freq: Two times a day (BID) | ORAL | Status: AC
  Administered 2016-09-04 (×2): 20 meq via ORAL
  Filled 2016-09-04 (×2): qty 1

## 2016-09-04 MED ORDER — ENOXAPARIN SODIUM 80 MG/0.8ML ~~LOC~~ SOLN
70.0000 mg | Freq: Two times a day (BID) | SUBCUTANEOUS | Status: DC
  Administered 2016-09-04: 11:00:00 70 mg via SUBCUTANEOUS
  Filled 2016-09-04: qty 0.8

## 2016-09-04 MED ORDER — ENOXAPARIN SODIUM 80 MG/0.8ML ~~LOC~~ SOLN
70.0000 mg | Freq: Two times a day (BID) | SUBCUTANEOUS | Status: DC
  Administered 2016-09-05 – 2016-09-09 (×8): 70 mg via SUBCUTANEOUS
  Filled 2016-09-04 (×8): qty 0.8

## 2016-09-04 NOTE — Progress Notes (Signed)
ANTICOAGULATION CONSULT NOTE - Initial Consult  Pharmacy Consult for Lovenox Indication: atrial fibrillation  Allergies  Allergen Reactions  . Zithromax [Azithromycin] Other (See Comments)    Side pain    Patient Measurements: Height: 5\' 7"  (170.2 cm) Weight: 156 lb (70.8 kg) IBW/kg (Calculated) : 66.1  Vital Signs: Temp: 97.1 F (36.2 C) (08/30 0445) Temp Source: Axillary (08/30 0445) BP: 95/53 (08/30 0445) Pulse Rate: 52 (08/30 0445)  Labs:  Recent Labs  09/02/16 0345 09/03/16 0426 09/03/16 0840 09/04/16 0221  HGB 9.4*  --  9.9* 9.9*  HCT 28.5*  --  28.5* 30.2*  PLT 172  --  211 176  CREATININE 0.59* 0.65  --  0.67    Estimated Creatinine Clearance: 75.7 mL/min (by C-G formula based on SCr of 0.67 mg/dL).  Medical History: Past Medical History:  Diagnosis Date  . Arthritis    "probably; get sore all over" (08/21/2016)  . Dyspnea   . Emphysema of lung ([place])    "from smoking" (08/21/2016)  . History of gout X 1  . HLD (hyperlipidemia)    [name] 08/21/2016  . Hypertension   . Pneumonia 1970s   Medications:  Prescriptions Prior to Admission  Medication Sig Dispense Refill Last Dose  . atorvastatin (LIPITOR) 40 MG tablet Take 40 mg by mouth at bedtime.    Past Week at Unknown time  . lisinopril (PRINIVIL,ZESTRIL) 10 MG tablet Take 10 mg by mouth at bedtime.    Past Week at Unknown time  . predniSONE (DELTASONE) 10 MG tablet Take 10-30 mg by mouth See admin instructions. Take 30 mg by mouth daily for 3 days, take 20 mg by mouth daily for 3 days, then take 10 mg by mouth daily for 3 days.   Past Week at Unknown time    Assessment: 33 yoM transitioning from Eliquis to Lovenox following suspicious finding on this mornings chest CT. Last dose of Eliquis 8/29 @ ~2200. HgB 9.9 stable and no reported hemoptysis since the event on 8/26. Renal function stable CrCl ~75 mL/min  Goal of Therapy:  Anti-Xa level 0.6-1 units/ml 4hrs after LMWH dose given Monitor platelets  by anticoagulation protocol: Yes   Plan:  Lovenox 70mg  SQ q12 hours Monitor renal function and s/sx of bleeding  Savaughn Karwowski L Julena Barbour 09/04/2016,11:06 AM

## 2016-09-04 NOTE — Progress Notes (Signed)
OT Cancellation Note  Patient Details Name: Edward Summers MRN: [id number] DOB: 11/11/1940   Cancelled Treatment:    Reason Eval/Treat Not Completed: Other (comment). In to see pt about 20 minutes ago and he was just starting supper, in to see pt now and he is still eating (says it takes him a long time, due to it gets hung up in his throat). Will re-attempt at a later time.  [name] [phone] 09/04/2016, 5:26 PM

## 2016-09-04 NOTE — Progress Notes (Signed)
I have insurance approval on appeal for admission to inpt rehab once medical workup is complete. Noted for Bronch Friday. Pt is aware and in agreement to admission to inpt rehab. I have notified RN CM. Hopeful for admit tomorrow after bronch or Saturday. I will follow up tomorrow. [phone]

## 2016-09-04 NOTE — Progress Notes (Addendum)
Progress Note  SUBJECTIVE:    Breathing is better today.   OBJECTIVE:   Vitals:   09/04/16 0000 09/04/16 0445  BP: (!) 81/60 (!) 95/53  Pulse: (!) 49 (!) 52  Resp: 19 19  Temp: 98 F (36.7 C) (!) 97.1 F (36.2 C)  SpO2: 95% 96%    Intake/Output Summary (Last 24 hours) at 09/04/16 0748 Last data filed at 09/03/16 1700  Gross per 24 hour  Intake              480 ml  Output             1525 ml  Net            -1045 ml   Breathing comfortably.  Right BKA incision is clean and healing well.  Left leg with pitting edema.   DATA:   CT CHEST WITH CONTRAST 09/03/2016  TECHNIQUE: Multidetector CT imaging of the chest was performed during intravenous contrast administration.  CONTRAST:  38mL ISOVUE-300 IOPAMIDOL (ISOVUE-300) INJECTION 61%  COMPARISON:  Multiple chest radiographs, most recent 08/25/2016. Chest CT 11/24/2005.  FINDINGS: Cardiovascular: Aortic and branch vessel atherosclerosis. Tortuous thoracic aorta. Mild cardiomegaly, without pericardial effusion. Multivessel coronary artery atherosclerosis. No central pulmonary embolism, on this non-dedicated study.  Mediastinum/Nodes: Low right jugular/ supraclavicular adenopathy. Example at 11 mm on image 9/ series 3.  Necrotic mediastinal adenopathy, with a right paratracheal node measuring 1.9 cm on image 27/series 3.  Right hilar adenopathy including at 2.8 cm. Mass-effect upon the esophagus, secondary to adenopathy. There is a fluid level in the upper esophagus.  Lungs/Pleura: Small right and trace left-sided pleural effusions. Mild loculation of right-sided effusion superiorly.  Endobronchial obstruction involving the right mainstem bronchus including on image 59/series 4. Central right lower lobe and infrahilar soft tissue mass which is most consistent with primary bronchogenic carcinoma, direct mediastinal invasion, and contiguous adenopathy. In combination, this measures on the order of 8.5  x 6.6 cm on image 78/series 3. Postobstructive pneumonitis throughout the right lower lobe and less so right middle lobes.  Right upper lobe patchy airspace and ground-glass opacity is likely related to postobstructive pneumonitis. Left-sided pulmonary nodules, including 8 mm nodule in the left lower lobe on image 91/series 4, similar. A left upper lobe 5 mm nodule on image 50/series 4 is new. Smaller left upper lobe nodules are present on the remote prior.  Upper Abdomen: Normal imaged portions of the liver, spleen, stomach, pancreas, gallbladder, right adrenal gland, and right kidney. Left adrenal nodularity is unchanged. An interpolar exophytic left renal lesion measures 2.4 cm and greater than fluid density. 2.1 cm back on 11/24/2005. Probable sebaceous cyst about the anterior left chest wall.  Musculoskeletal: Moderate thoracic spondylosis.  IMPRESSION: 1. 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 Direct mediastinal invasion, nodal metastasis within the chest/low neck, and endobronchial obstruction. 2. Postobstructive collapse and pneumonitis, as detailed above. 3. Right larger than left pleural effusions with mild loculation involving the right-sided pleural effusion. 4. Upper esophageal fluid level could represent dysmotility or partial obstruction secondary to the mediastinal mass. 5. Coronary artery atherosclerosis. Aortic Atherosclerosis (ICD10-I70.0). 6. Left renal lesion is technically indeterminate but likely a complex cyst, given presence back in 2007. 7. Left-sided pulmonary nodules, primarily similar and therefore benign. A left upper lobe 5 mm nodule is new and could represent a metastasis. 8. The patient may be predisposed to SVC syndrome. ASSESSMENT/PLAN:   76 y.o. male is s/p: right BKA 7 Days Post-Op   Dyspnea: Reports  breathing is better today. CT chest findings yesterday consistent with primary bronchogenic carcinoma. Also left  sided pulmonary nodules and mild loculation right pleural effusion. Will consult pulmonary for bronchoscopy and biopsy. Eliquis held. Last dose was 2219 yesterday. Will need lovenox for anticoagulation.  Afib: Has been sinus brady in the 40s and 50s this am. Per cardiology.   Hypokalemia: 3.4 today. Will replete.   Peripheral edema: On po lasix 40 mg.   Alvia Grove 09/04/2016 7:48 AM -- LABS:   CBC    Component Value Date/Time   WBC 21.1 (H) 09/04/2016 0221   HGB 9.9 (L) 09/04/2016 0221   HCT 30.2 (L) 09/04/2016 0221   PLT 176 09/04/2016 0221    BMET    Component Value Date/Time   NA 132 (L) 09/04/2016 0221   K 3.4 (L) 09/04/2016 0221   CL 100 (L) 09/04/2016 0221   CO2 26 09/04/2016 0221   GLUCOSE 103 (H) 09/04/2016 0221   BUN 8 09/04/2016 0221   CREATININE 0.67 09/04/2016 0221   CALCIUM 7.3 (L) 09/04/2016 0221   GFRNONAA >60 09/04/2016 0221   GFRAA >60 09/04/2016 0221    COAG Lab Results  Component Value Date   INR 2.07 08/25/2016   INR 1.14 08/21/2016   No results found for: PTT  ANTIBIOTICS:   Anti-infectives    Start     Dose/Rate Route Frequency Ordered Stop   08/28/16 0000  ceFAZolin (ANCEF) IVPB 1 g/50 mL premix    Comments:  Send with pt to OR   1 g 100 mL/hr over 30 Minutes Intravenous On call 08/27/16 1039 08/28/16 1258   08/23/16 1345  levofloxacin (LEVAQUIN) tablet 750 mg     750 mg Oral Daily 08/23/16 1344 08/28/16 0959   08/21/16 2359  cefUROXime (ZINACEF) 1.5 g in dextrose 5 % 50 mL IVPB     1.5 g 100 mL/hr over 30 Minutes Intravenous Every 12 hours 08/21/16 2254 08/22/16 1202   08/21/16 2300  doxycycline (VIBRA-TABS) tablet 100 mg  Status:  Discontinued     100 mg Oral Every 12 hours 08/21/16 2132 08/23/16 [place], PA-C [facility] Office: [phone] Pager: [phone] 09/04/2016 7:48 AM   I have independently interviewed and examined patient and agree with PA assessment and plan above.  Now has bronchogenic appearing carcinoma as detailed above. Had lengthy discussion with him this morning. eliquis now held and will transition to lovenox. Consult to pulmonary. Rehab on hold.   Shealynn Saulnier C. Donzetta Matters, MD [facility] [place] Office: [phone] Pager: [phone]

## 2016-09-04 NOTE — Progress Notes (Signed)
eLink Physician-Brief Progress Note Patient Name: Edward Summers DOB: Feb 16, 1940 MRN: [id number]   Date of Service  09/04/2016  HPI/Events of Note  Notified by bedside nurse that patient has transition off Eliquis to Lovenox subcutaneous twice daily. Patient planned for bronchoscopy tomorrow afternoon to evaluate lung mass. Possible need for biopsy. Normal renal function.   eICU Interventions  1. Continuing nothing by mouth after midnight 2. Holding 10 PM and 10 AM doses of Lovenox      Intervention Category Major Interventions: Other:  Tera Partridge 09/04/2016, 3:44 PM

## 2016-09-04 NOTE — Progress Notes (Signed)
Physical Therapy Treatment Patient Details Name: Edward Summers MRN: [id number] DOB: 07-02-1940 Today's Date: 09/04/2016    History of Present Illness 76 year old male with history of hypertension, hyperlipidemia, emphysema who was admitted by vascular surgery for acute right leg ischemia and underwent surgical thromboembolectomy of right popliteal, PT and AT arteries. Hospitalist was consulted because patient developed postprocedure atrial fibrillation with rapid ventricular rate and hypotension. Cardiology was also consulted for the same. 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 CT 09/03/16 with lung mass found. PMH:  HLD, HTN, COPD    PT Comments    Patient eager to mobilize and continues to make progress toward mobility goals. Pt expressed that he is upset about recent diagnosis of lung mass but remains motivated to gain independence.  Pt was able to ambulate 24ft then 41ft with min A for balance and weight shifting. Pt tolerated all bilat LE well and reported working on exercises throughout the day. Pt limited by DOE with attempts to ambulate. Continue to progress as tolerated.   Follow Up Recommendations  CIR     Equipment Recommendations  Other (comment) (to be determined at next venue)    Recommendations for Other Services       Precautions / Restrictions Precautions Precautions: Fall Restrictions Weight Bearing Restrictions: Yes RLE Weight Bearing: Non weight bearing    Mobility  Bed Mobility               General bed mobility comments: pt OOB in chair upon arrival  Transfers Overall transfer level: Needs assistance Equipment used: Rolling walker (2 wheeled) Transfers: Sit to/from Stand Sit to Stand: Mod assist;Min assist         General transfer comment: assist to power up into standing and to steady upon stand; cues for safe hand placement with carry over demonstrated next trial   Ambulation/Gait Ambulation/Gait assistance: Min assist;+2 safety/equipment Ambulation  Distance (Feet):  (31ft then 99ft) Assistive device: Rolling walker (2 wheeled) Gait Pattern/deviations: Step-to pattern;Trunk flexed     General Gait Details: cues for sequencing and assist for balance and weight shifting; seated break required due to 2/4 DOE   Stairs            Wheelchair Mobility    Modified Rankin (Stroke Patients Only)       Balance Overall balance assessment: Needs assistance Sitting-balance support: No upper extremity supported;Feet unsupported Sitting balance-Leahy Scale: Good     Standing balance support: Bilateral upper extremity supported Standing balance-Leahy Scale: Poor                              Cognition Arousal/Alertness: Awake/alert Behavior During Therapy: WFL for tasks assessed/performed Overall Cognitive Status: Within Functional Limits for tasks assessed                                        Exercises General Exercises - Lower Extremity Ankle Circles/Pumps: AROM;Left;20 reps Quad Sets: AROM;Both;10 reps Short Arc Quad: AROM;Right;10 reps Hip ABduction/ADduction: AROM;Right;10 reps Hip Flexion/Marching: AROM;Right;10 reps    General Comments        Pertinent Vitals/Pain Pain Assessment: No/denies pain Pain Intervention(s): Monitored during session    Home Living                      Prior Function  PT Goals (current goals can now be found in the care plan section) Acute Rehab PT Goals Patient Stated Goal: return home PT Goal Formulation: With patient Time For Goal Achievement: 09/05/16 Potential to Achieve Goals: Good Progress towards PT goals: Progressing toward goals    Frequency    Min 3X/week      PT Plan Current plan remains appropriate    Co-evaluation              AM-PAC PT "6 Clicks" Daily Activity  Outcome Measure  Difficulty turning over in bed (including adjusting bedclothes, sheets and blankets)?: None Difficulty moving from lying  on back to sitting on the side of the bed? : A Lot Difficulty sitting down on and standing up from a chair with arms (e.g., wheelchair, bedside commode, etc,.)?: Unable Help needed moving to and from a bed to chair (including a wheelchair)?: A Little Help needed walking in hospital room?: A Little Help needed climbing 3-5 steps with a railing? : A Lot 6 Click Score: 15    End of Session Equipment Utilized During Treatment: Gait belt Activity Tolerance: Patient tolerated treatment well Patient left: in chair;with call bell/phone within reach (With LE's elevated) Nurse Communication: Mobility status PT Visit Diagnosis: Unsteadiness on feet (R26.81);Muscle weakness (generalized) (M62.81);Difficulty in walking, not elsewhere classified (R26.2);Pain Pain - Right/Left: Right Pain - part of body: Leg;Ankle and joints of foot     Time: 9629-5284 PT Time Calculation (min) (ACUTE ONLY): 23 min  Charges:  $Gait Training: 8-22 mins $Therapeutic Exercise: 8-22 mins                    G Codes:       Earney Navy, PTA Pager: [phone]     Darliss Cheney 09/04/2016, 3:02 PM

## 2016-09-04 NOTE — Progress Notes (Signed)
Nutrition Follow Up  DOCUMENTATION CODES:   Not applicable  INTERVENTION:    Boost Breeze po BID, each supplement provides 250 kcal and 9 grams of protein   Prostat liquid protein po 30 ml BID with meals, each supplement provides 100 kcal, 15 grams protein  NUTRITION DIAGNOSIS:   Increased nutrient needs now related to catabolic illness as evidenced by estimated needs, ongoing  GOAL:   Patient will meet greater than or equal to 90% of their needs, progressing  MONITOR:   PO intake, Supplement acceptance, Labs, Weight trends, Skin, I & O's  ASSESSMENT:   76 yo Male with history of hypertension, hyperlipidemia, emphysema who was admitted by vascular surgery for acute right leg ischemia and underwent surgical thromboembolectomy of right popliteal, PT and AT arteries. Hospitalist was consulted because patient developed postprocedure atrial fibrillation with rapid ventricular rate and hypotension. Cardiology was also consulted for the same.  Pt admitted with acute R lower extremity ischemia. Ischemia became critical and therefore he is s/p BKA 8/23. Noted Rapid Response Event 8/26. Pt coughing up copious amounts of bloody sputum.  PO intake variable at 50-75% per flowsheet records.  States he's getting Boost Breeze once daily. Encouraged him to drink at least 2 per day. Will order Prostat liquid protein to have him try mixed in a beverage.  CT chest 8/30 unfortunately revealed multiple lung masses. Labs and medications reviewed. Na 132 (L). K 3.4 (L). Lung biopsy planned for tomorrow.  Diet Order:  Diet heart healthy/carb modified Room service appropriate? Yes; Fluid consistency: Thin Diet NPO time specified  Skin:  MASD to sacrum   Last BM:  8/30  Height:   Ht Readings from Last 1 Encounters:  08/28/16 5\' 7"  (1.702 m)   Weight:   Wt Readings from Last 1 Encounters:  08/28/16 156 lb (70.8 kg)   Ideal Body Weight:  67.2 kg  BMI:  Body mass index is 24.43  kg/m.  Estimated Nutritional Needs:   Kcal:  1800-2000  Protein:  90-110 gm  Fluid:  1.8-2.0 L  EDUCATION NEEDS:   No education needs identified at this time  Arthur Holms, RD, LDN Pager #: [phone] After-Hours Pager #: [phone]

## 2016-09-04 NOTE — Consult Note (Signed)
Name: Edward Summers MRN: [id number] DOB: 1940-03-19    ADMISSION DATE:  08/21/2016 CONSULTATION DATE:  8/30  REFERRING MD :  VVS  CHIEF COMPLAINT:  Lung mass   BRIEF PATIENT DESCRIPTION: 76yo male former smoker (52 pack years, quit 6 weeks ago) with hx COPD, HTN who presented 8/16 with BLE swelling and painful, cold R foot.  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  Post op had SOB and CT chest to evaluate this revealed multiple lung masses, largest R side with post-obstructive pneumonitis.  PCCM consulted.    SIGNIFICANT EVENTS  8/23>> R BKA for limb ischemia  STUDIES:  CT chest 8/30>>> Endobronchial obstruction involving the right mainstem bronchus including on image 59/series 4. Central right lower lobe and infrahilar soft tissue mass which is most consistent with primary bronchogenic carcinoma, direct mediastinal invasion, and contiguous adenopathy. In combination, this measures on the order of 8.5 x 6.6 cm on image 78/series 3. Postobstructive pneumonitis throughout the right lower lobe and less so right middle lobes.  Right upper lobe patchy airspace and ground-glass opacity is likely related to postobstructive pneumonitis. Left-sided pulmonary nodules, including 8 mm nodule in the left lower lobe on image 91/series 4, similar. A left upper lobe 5 mm nodule on image 50/series 4 is new. Smaller left upper lobe nodules are present on the remote prior.  Mediastinum/Nodes: Low right jugular/ supraclavicular adenopathy. Example at 11 mm on image 9/ series 3.  Necrotic mediastinal adenopathy, with a right paratracheal node measuring 1.9 cm on image 27/series 3.  Right hilar adenopathy including at 2.8 cm. Mass-effect upon the esophagus, secondary to adenopathy. There is a fluid level in the upper esophagus.   Right larger than left pleural effusions with mild loculation involving the right-sided pleural effusion.  Upper  esophageal fluid level could represent dysmotility or partial obstruction secondary to the mediastinal mass  HISTORY OF PRESENT ILLNESS:  76yo male former smoker (52 pack years) with hx COPD, HTN who presented 8/16 with BLE swelling and painful, cold R foot.  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  Post op had SOB and CT chest to evaluate this revealed multiple lung masses, largest R side with post-obstructive pneumonitis.  PCCM consulted.    Pt currently c/o mild SOB, cough with brown sputum, difficulty swallowing at times. Has had a few episodes of hemoptysis.   Admits to ~20lbs weight loss over last few months.  Former smoker - started about age 37, 1ppd, quit ~6 weeks ago when he had difficulty swallowing and "got scared".  Denies chest pain, headache, syncope.   PAST MEDICAL HISTORY :   has a past medical history of Arthritis; Dyspnea; Emphysema of lung ([place]); History of gout (X 1); HLD (hyperlipidemia); Hypertension; and Pneumonia (1970s).  has a past surgical history that includes Lower extremity angiogram (Right, 08/21/2016); Thrombectomy (Right, 08/21/2016); Back surgery; Lumbar disc surgery; Tonsillectomy; Cataract extraction w/ intraocular lens implant (Right); lower extremity angiogram (Right, 08/21/2016); Embolectomy (Right, 08/21/2016); Lower Extremity Angiography (N/A, 08/25/2016); and Amputation (Right, 08/28/2016). Prior to Admission medications   Medication Sig Start Date End Date Taking? Authorizing Provider  atorvastatin (LIPITOR) 40 MG tablet Take 40 mg by mouth at bedtime.  05/15/16  Yes [provider]  lisinopril (PRINIVIL,ZESTRIL) 10 MG tablet Take 10 mg by mouth at bedtime.  07/26/16  Yes [provider]  predniSONE (DELTASONE) 10 MG tablet Take 10-30 mg by mouth  See admin instructions. Take 30 mg by mouth daily for 3 days, take 20 mg by mouth daily for 3 days, then take 10 mg by mouth daily for 3 days. 08/20/16  Yes  [provider]   Allergies  Allergen Reactions  . Zithromax [Azithromycin] Other (See Comments)    Side pain    FAMILY HISTORY:  family history is not on file. SOCIAL HISTORY:  reports that he quit smoking about 1 months ago. His smoking use included Cigarettes. He has a 85.50 pack-year smoking history. He has never used smokeless tobacco. He reports that he drinks alcohol. He reports that he does not use drugs.  REVIEW OF SYSTEMS:   As per HPI - All other systems reviewed and were neg.    SUBJECTIVE:   VITAL SIGNS: Temp:  [97.1 F (36.2 C)-98.5 F (36.9 C)] 97.1 F (36.2 C) (08/30 0445) Pulse Rate:  [49-66] 52 (08/30 0445) Resp:  [19-27] 19 (08/30 0445) BP: (81-112)/(53-60) 95/53 (08/30 0445) SpO2:  [94 %-96 %] 96 % (08/30 0445)  PHYSICAL EXAMINATION: General:  Pleasant male, NAD in bed  Neuro:  Awake, alert, MAE HEENT:  Mm moist, no JVD, no palpable lymphadenopathy  Cardiovascular:  s1s2 rrr Lungs:  resps even non labored on RA, coarse bs R>L, few exp wheezes Abdomen:  Round, soft, non tender  Musculoskeletal:  Warm and dry, fresh R BKA dressing c/d, no edema L    Recent Labs Lab 09/02/16 0345 09/03/16 0426 09/04/16 0221  NA 130* 130* 132*  K 3.1* 5.0 3.4*  CL 98* 100* 100*  CO2 23 25 26   BUN 7 12 8   CREATININE 0.59* 0.65 0.67  GLUCOSE 95 94 103*    Recent Labs Lab 09/02/16 0345 09/03/16 0840 09/04/16 0221  HGB 9.4* 9.9* 9.9*  HCT 28.5* 28.5* 30.2*  WBC 22.1* 23.3* 21.1*  PLT 172 211 176   Ct Chest W Contrast  Result Date: 09/03/2016 CLINICAL DATA:  Followup of lung nodule. Abnormal chest radiograph, demonstrating possible right hilar soft tissue fullness. EXAM: CT CHEST WITH CONTRAST TECHNIQUE: Multidetector CT imaging of the chest was performed during intravenous contrast administration. CONTRAST:  15mL ISOVUE-300 IOPAMIDOL (ISOVUE-300) INJECTION 61% COMPARISON:  Multiple chest radiographs, most recent 08/25/2016. Chest CT 11/24/2005.  FINDINGS: Cardiovascular: Aortic and branch vessel atherosclerosis. Tortuous thoracic aorta. Mild cardiomegaly, without pericardial effusion. Multivessel coronary artery atherosclerosis. No central pulmonary embolism, on this non-dedicated study. Mediastinum/Nodes: Low right jugular/ supraclavicular adenopathy. Example at 11 mm on image 9/ series 3. Necrotic mediastinal adenopathy, with a right paratracheal node measuring 1.9 cm on image 27/series 3. Right hilar adenopathy including at 2.8 cm. Mass-effect upon the esophagus, secondary to adenopathy. There is a fluid level in the upper esophagus. Lungs/Pleura: Small right and trace left-sided pleural effusions. Mild loculation of right-sided effusion superiorly. Endobronchial obstruction involving the right mainstem bronchus including on image 59/series 4. Central right lower lobe and infrahilar soft tissue mass which is most consistent with primary bronchogenic carcinoma, direct mediastinal invasion, and contiguous adenopathy. In combination, this measures on the order of 8.5 x 6.6 cm on image 78/series 3. Postobstructive pneumonitis throughout the right lower lobe and less so right middle lobes. Right upper lobe patchy airspace and ground-glass opacity is likely related to postobstructive pneumonitis. Left-sided pulmonary nodules, including 8 mm nodule in the left lower lobe on image 91/series 4, similar. A left upper lobe 5 mm nodule on image 50/series 4 is new. Smaller left upper lobe nodules are present on the remote prior.  Upper Abdomen: Normal imaged portions of the liver, spleen, stomach, pancreas, gallbladder, right adrenal gland, and right kidney. Left adrenal nodularity is unchanged. An interpolar exophytic left renal lesion measures 2.4 cm and greater than fluid density. 2.1 cm back on 11/24/2005. Probable sebaceous cyst about the anterior left chest wall. Musculoskeletal: Moderate thoracic spondylosis. IMPRESSION: 1. 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 Direct mediastinal invasion, nodal metastasis within the chest/low neck, and endobronchial obstruction. 2. Postobstructive collapse and pneumonitis, as detailed above. 3. Right larger than left pleural effusions with mild loculation involving the right-sided pleural effusion. 4. Upper esophageal fluid level could represent dysmotility or partial obstruction secondary to the mediastinal mass. 5. Coronary artery atherosclerosis. Aortic Atherosclerosis (ICD10-I70.0). 6. Left renal lesion is technically indeterminate but likely a complex cyst, given presence back in 2007. 7. Left-sided pulmonary nodules, primarily similar and therefore benign. A left upper lobe 5 mm nodule is new and could represent a metastasis. 8. The patient may be predisposed to SVC syndrome. Electronically Signed   By: Abigail Miyamoto M.D.   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 esophageal obstruction.   PLAN -  FOB for inspection and bx - will plan for ~1pm tomorrow  NPO after MN  continue to hold eliquis  Suggest speech for swallowing eval  Consider empiric abx for ?post-obstructive PNA - will hold for now - afebrile, wbc trending down post op F/u CXR     Nickolas Madrid, NP 09/04/2016  9:28 AM Pager: [phone] or [phone]

## 2016-09-04 NOTE — Progress Notes (Signed)
Progress Note  Patient Name: Edward Summers Date of Encounter: 09/04/2016  Primary Cardiologist: Dr. Debara Pickett  Subjective   Intermittent dyspnea; no chest pain  Inpatient Medications    Scheduled Meds: . amiodarone  200 mg Oral BID  . atorvastatin  40 mg Oral q1800  . dextromethorphan-guaiFENesin  1 tablet Oral BID  . docusate sodium  100 mg Oral Daily  . feeding supplement  1 Container Oral TID BM  . furosemide  40 mg Oral Daily  . metoprolol tartrate  12.5 mg Oral BID  . mometasone-formoterol  2 puff Inhalation BID  . pantoprazole  40 mg Oral Daily  . potassium chloride  20 mEq Oral BID  . predniSONE  20 mg Oral Q breakfast   And  . [START ON 09/06/2016] predniSONE  10 mg Oral Q breakfast  . sodium chloride flush  3 mL Intravenous Q12H   Continuous Infusions: . sodium chloride    . sodium chloride    . lactated ringers 10 mL/hr at 08/21/16 1709  . lactated ringers 10 mL/hr at 08/28/16 1126  . magnesium sulfate 1 - 4 g bolus IVPB     PRN Meds: sodium chloride, sodium chloride, acetaminophen **OR** acetaminophen, acetaminophen, alum & mag hydroxide-simeth, bisacodyl, guaiFENesin-dextromethorphan, hydrALAZINE, hydrALAZINE, ipratropium, labetalol, levalbuterol, magnesium sulfate 1 - 4 g bolus IVPB, ondansetron (ZOFRAN) IV, oxyCODONE-acetaminophen, phenol, senna-docusate, sodium chloride flush   Vital Signs    Vitals:   09/03/16 2017 09/04/16 0000 09/04/16 0445 09/04/16 0931  BP:  (!) 81/60 (!) 95/53   Pulse: 65 (!) 49 (!) 52   Resp: 19 19 19    Temp:  98 F (36.7 C) (!) 97.1 F (36.2 C)   TempSrc:  Oral Axillary   SpO2: 95% 95% 96% 100%  Weight:      Height:        Intake/Output Summary (Last 24 hours) at 09/04/16 1016 Last data filed at 09/04/16 0730  Gross per 24 hour  Intake              720 ml  Output             1350 ml  Net             -630 ml   Filed Weights   08/21/16 1716 08/21/16 2258 08/28/16 1123  Weight: 62.6 kg (138 lb) 71.2 kg (156 lb 14.4  oz) 70.8 kg (156 lb)    Telemetry    Sinus bradycardia- Personally Reviewed   Physical Exam   General: Well developed, well nourished, NAD Head: Normal Neck: Supple Lungs:  Diminished BS bases Heart: RRR Abdomen: Soft, NT/ND Extremities: 1-2 + pitting edema up to mid-shin on left. Right BKA.  Neuro: Grossly intact   Labs    Chemistry  Recent Labs Lab 09/02/16 0345 09/03/16 0426 09/04/16 0221  NA 130* 130* 132*  K 3.1* 5.0 3.4*  CL 98* 100* 100*  CO2 23 25 26   GLUCOSE 95 94 103*  BUN 7 12 8   CREATININE 0.59* 0.65 0.67  CALCIUM 7.2* 7.4* 7.3*  GFRNONAA >60 >60 >60  GFRAA >60 >60 >60  ANIONGAP 9 5 6      Hematology  Recent Labs Lab 09/02/16 0345 09/03/16 0840 09/04/16 0221  WBC 22.1* 23.3* 21.1*  RBC 3.32* 3.33* 3.48*  HGB 9.4* 9.9* 9.9*  HCT 28.5* 28.5* 30.2*  MCV 85.8 85.6 86.8  MCH 28.3 29.7 28.4  MCHC 33.0 34.7 32.8  RDW 17.2* 17.7* 17.4*  PLT 172 211 176  Radiology    Dg Chest Port 1 View  Result Date: 09/02/2016 CLINICAL DATA:  Shortness of breath EXAM: PORTABLE CHEST 1 VIEW COMPARISON:  Chest x-ray of 08/31/2016 and 07/04/2016 FINDINGS: The chest x-ray of 07/04/2016 shows a clear right lung base. However the persistence of right basilar volume loss in abnormality the right hilum is worrisome for possible central endobronchial process. Also there may be a small right pleural effusion present. CT of the chest with IV contrast media is recommended to assess further. The left lung is clear. Mild cardiomegaly is stable. No bony abnormality is seen. IMPRESSION: 1. Persistent abnormal opacity at the right lung base with abnormality of the right hilum and infrahilar region. Cannot exclude a central endobronchial process. Recommend CT of the chest with IV contrast media. 2. Small right pleural effusion. Electronically Signed   By: Ivar Drape M.D.   On: 09/02/2016 09:02    Cardiac Studies   Echocardiogram: 08/2016 Study Conclusions  - Left  ventricle: The cavity size was normal. Systolic function was normal. The estimated ejection fraction was in the range of 60% to 65%. Wall motion was normal; there were no regional wall motion abnormalities. - Aortic valve: Transvalvular velocity was within the normal range. There was no stenosis. There was no regurgitation. - Mitral valve: Transvalvular velocity was within the normal range. There was no evidence for stenosis. There was no regurgitation. - Right ventricle: The cavity size was normal. Wall thickness was normal. Systolic function was normal. - Atrial septum: No defect or patent foramen ovale was identified by color flow Doppler. - Tricuspid valve: There was no regurgitation.  Patient Profile     76 y.o. male with PMH of HTN and HLD admitted for acute limb ischemia. Cardiology consulted for atrial fibrillation with RVR.   Assessment & Plan    1. Post-operative Atrial Fibrillation -Plan to continue amiodarone 200 mg twice a day to complete 2 full weeks and then 200 mg daily thereafter. Continue metoprolol. Results of CT scan noted. Would hold apixaban for now and treat with lovenox. Will resume apixaban at DC.  2. Hemoptysis - No recurrence. CT scan suggestive of lung cancer. Further workup per pulmonary. Resume anticoagulation when safe and pulmonary evaluation complete.  3. 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 Per vascular surgery. - continue statin therapy. No ASA secondary to need for Eliquis.    4. Postoperative volume excess - Patient remains volume overloaded. Continue Lasix 40 mg daily.  5. Hypokalemia - Supplement  6. Abnormal CXR - CT scan suggests lung cancer. Pulmonary evaluation in process.  Signed, Kirk Ruths , MD 10:16 AM 09/04/2016

## 2016-09-04 NOTE — Progress Notes (Addendum)
Spoke with on-call PCCM MD - Dr. Ashok Cordia. Verbal order to hold tonight (8/30 2200) as well as tomorrow morning (8/31 1000) dose of Lovenox prior to bronchoscopy. Will make oncoming nurse, charge nurse, and pharmacy aware.   Fritz Pickerel, RN

## 2016-09-05 ENCOUNTER — Ambulatory Visit
Admission: RE | Admit: 2016-09-05 | Discharge: 2016-09-05 | Disposition: A | Discharge status: Home / Self Care | Payer: Medicare HMO | Source: Ambulatory Visit | Attending: Radiation Oncology | Admitting: Radiation Oncology

## 2016-09-05 ENCOUNTER — Inpatient Hospital Stay (HOSPITAL_COMMUNITY): Payer: Medicare HMO

## 2016-09-05 ENCOUNTER — Encounter (HOSPITAL_COMMUNITY)
Admission: EM | Disposition: A | Discharge status: Skilled Nursing Facility | Payer: Self-pay | Source: Home / Self Care | Attending: Vascular Surgery

## 2016-09-05 DIAGNOSIS — Z510 Encounter for antineoplastic radiation therapy: Secondary | ICD-10-CM | POA: Insufficient documentation

## 2016-09-05 DIAGNOSIS — C7931 Secondary malignant neoplasm of brain: Secondary | ICD-10-CM | POA: Insufficient documentation

## 2016-09-05 DIAGNOSIS — R918 Other nonspecific abnormal finding of lung field: Secondary | ICD-10-CM | POA: Diagnosis present

## 2016-09-05 DIAGNOSIS — Z79899 Other long term (current) drug therapy: Secondary | ICD-10-CM | POA: Insufficient documentation

## 2016-09-05 DIAGNOSIS — C3431 Malignant neoplasm of lower lobe, right bronchus or lung: Secondary | ICD-10-CM | POA: Insufficient documentation

## 2016-09-05 DIAGNOSIS — R0603 Acute respiratory distress: Secondary | ICD-10-CM | POA: Diagnosis present

## 2016-09-05 HISTORY — PX: VIDEO BRONCHOSCOPY: SHX5072

## 2016-09-05 LAB — BASIC METABOLIC PANEL
Anion gap: 8 (ref 5–15)
BUN: 10 mg/dL (ref 6–20)
CALCIUM: 7.2 mg/dL — AB (ref 8.9–10.3)
CO2: 22 mmol/L (ref 22–32)
Chloride: 100 mmol/L — ABNORMAL LOW (ref 101–111)
Creatinine, Ser: 0.59 mg/dL — ABNORMAL LOW (ref 0.61–1.24)
GLUCOSE: 106 mg/dL — AB (ref 65–99)
POTASSIUM: 3.6 mmol/L (ref 3.5–5.1)
SODIUM: 130 mmol/L — AB (ref 135–145)

## 2016-09-05 LAB — CBC
HCT: 28.9 % — ABNORMAL LOW (ref 39.0–52.0)
Hemoglobin: 9.3 g/dL — ABNORMAL LOW (ref 13.0–17.0)
MCH: 27.7 pg (ref 26.0–34.0)
MCHC: 32.2 g/dL (ref 30.0–36.0)
MCV: 86 fL (ref 78.0–100.0)
PLATELETS: 197 10*3/uL (ref 150–400)
RBC: 3.36 MIL/uL — AB (ref 4.22–5.81)
RDW: 17.2 % — AB (ref 11.5–15.5)
WBC: 22.9 10*3/uL — AB (ref 4.0–10.5)

## 2016-09-05 LAB — PROTIME-INR
INR: 1.22
PROTHROMBIN TIME: 15.3 s — AB (ref 11.4–15.2)

## 2016-09-05 SURGERY — VIDEO BRONCHOSCOPY WITHOUT FLUORO
Anesthesia: Moderate Sedation | Laterality: Bilateral

## 2016-09-05 MED ORDER — EPINEPHRINE PF 1 MG/10ML IJ SOSY
PREFILLED_SYRINGE | INTRAMUSCULAR | Status: DC | PRN
  Administered 2016-09-05: 0.3 mg via INTRAVENOUS

## 2016-09-05 MED ORDER — FENTANYL CITRATE (PF) 100 MCG/2ML IJ SOLN
INTRAMUSCULAR | Status: AC
  Filled 2016-09-05: qty 4

## 2016-09-05 MED ORDER — PHENYLEPHRINE HCL 0.25 % NA SOLN
NASAL | Status: DC | PRN
  Administered 2016-09-05: 2 via NASAL

## 2016-09-05 MED ORDER — LIDOCAINE HCL 2 % EX GEL
1.0000 "application " | Freq: Once | CUTANEOUS | Status: DC
  Filled 2016-09-05: qty 5

## 2016-09-05 MED ORDER — LIDOCAINE HCL 2 % EX GEL: 1.0000 "application " | Freq: Once | CUTANEOUS | Status: DC

## 2016-09-05 MED ORDER — FENTANYL CITRATE (PF) 100 MCG/2ML IJ SOLN
INTRAMUSCULAR | Status: DC | PRN
  Administered 2016-09-05 (×2): 25 ug via INTRAVENOUS

## 2016-09-05 MED ORDER — BUTAMBEN-TETRACAINE-BENZOCAINE 2-2-14 % EX AERO: 1.0000 | INHALATION_SPRAY | Freq: Once | CUTANEOUS | Status: DC

## 2016-09-05 MED ORDER — SODIUM CHLORIDE 0.9 % IV SOLN
INTRAVENOUS | Status: DC
  Administered 2016-09-05 – 2016-09-15 (×5): via INTRAVENOUS

## 2016-09-05 MED ORDER — MIDAZOLAM HCL 5 MG/ML IJ SOLN
INTRAMUSCULAR | Status: AC
  Filled 2016-09-05: qty 2

## 2016-09-05 MED ORDER — LIDOCAINE HCL 1 % IJ SOLN
INTRAMUSCULAR | Status: DC | PRN
Start: 2016-09-05 — End: 2016-09-05
  Administered 2016-09-05: 6 mL via RESPIRATORY_TRACT

## 2016-09-05 MED ORDER — AMIODARONE HCL 200 MG PO TABS
200.0000 mg | ORAL_TABLET | Freq: Every day | ORAL | Status: DC
  Administered 2016-09-05 – 2016-09-16 (×11): 200 mg via ORAL
  Filled 2016-09-05 (×13): qty 1

## 2016-09-05 MED ORDER — LIDOCAINE HCL 2 % EX GEL
CUTANEOUS | Status: DC | PRN
  Administered 2016-09-05: 1

## 2016-09-05 MED ORDER — BUTAMBEN-TETRACAINE-BENZOCAINE 2-2-14 % EX AERO
1.0000 | INHALATION_SPRAY | Freq: Once | CUTANEOUS | Status: DC
  Filled 2016-09-05: qty 20

## 2016-09-05 MED ORDER — MIDAZOLAM HCL 10 MG/2ML IJ SOLN
INTRAMUSCULAR | Status: DC | PRN
  Administered 2016-09-05 (×2): 1 mg via INTRAVENOUS

## 2016-09-05 MED ORDER — PHENYLEPHRINE HCL 0.25 % NA SOLN
1.0000 | Freq: Four times a day (QID) | NASAL | Status: DC | PRN
  Filled 2016-09-05: qty 15

## 2016-09-05 NOTE — Care Management Important Message (Signed)
Important Message  Patient Details  Name: Edward Summers MRN: [id number] Date of Birth: 03/11/1940   Medicare Important Message Given:  Yes    [name] 09/05/2016, 9:30 AM

## 2016-09-05 NOTE — Progress Notes (Signed)
[facility] PCCM  I spoke to the patient and his son at length today.  The patient has a large mass extending from the R carina down to the right lower lobe, completely occluding the airway.  Given the rapid growth compared to earlier in the hospitalization and the rapid loss of the right lung, the best approach is to start radiation therapy right away.  Have discussed with thoracic surgery and radiation oncology.  Will discuss with vascular to have the patient transferred to [facility].    Roselie Awkward, MD [facility] Pager: [phone] Cell: [phone] After 3pm or if no response, call [phone]

## 2016-09-05 NOTE — Progress Notes (Signed)
I contacted Dr. Donzetta Matters and he is aware of insurance approval for an inpt rehab admission. I am seeking clarification of when pt would be ready for d/c after Bronch today. Dr. Donzetta Matters will clarify with pulmonary after Bronch. I will follow up today. [phone]

## 2016-09-05 NOTE — Care Management Note (Signed)
Case Management Note Marvetta Gibbons RN, BSN Unit 4E-Case Manager [phone]  Patient Details  Name: Edward Summers MRN: [id number] Date of Birth: [birth year]  Subjective/Objective:    Pt admitted with ischemic foot pain- s/p embolectomy                 Action/Plan: PTA pt lived at home, Per PT eval recommendations for [facility]- CM to follow  Expected Discharge Date:  09/04/16               Expected Discharge Plan:  [place]  In-House Referral:  Clinical Social Work  Discharge planning Services  CM Consult, [place]  Post Acute Care Choice:  NA Choice offered to:     DME Arranged:    DME Agency:     HH Arranged:    [place] Agency:     Status of Service:  Completed, signed off  If discussed at [employer] of Stay Meetings, dates discussed:  8/21, 8/23, 8/28, 8/31   Discharge Disposition:   Additional Comments:  09/05/16- 1530- Marvetta Gibbons RN, CM- noted CIR note following bronch- Per Dr. Donzetta Matters pt not ready for CIR today- CIR to follow up with Dr. Donzetta Matters for possible admission early next week. Pt can d/c to CIR and still have tx with radiation at [facility] per CIR.   09/04/16- 1630- Marvetta Gibbons RN, CM- heard from [place] with CIR- pt's denial has been overturned and he now has approval per insurance for CIR- CIR can admit Friday or Sat. Pending medical readiness- pt for Bronch tomorrow afternoon to assess newly found lung mass. Will f/u with CIR after Bronch.   09/03/16- 1500- Wagner Tanzi RN, CM- pt has been denied IP rehab- per Pamala Hurry working on appeal for [employer]- awaiting final outcome- Chest CT scheduled for today.   09/01/16- 1000- Koen Antilla RN, CM- pt s/p BKA, recommendations for CIR- consult has been placed- CSW will follow for SNF backup plan.   08/28/16- 1620- Megan Presti RN, CM- post op pt continued to have rest pain- s/p BKA today- will need repeat PT/OT evals- post BKA to determine best d/c plan for pt- pt most likely will need rehab ?SNF- CM will f/u post  op  Dawayne Patricia, RN 09/05/2016, 3:46 PM

## 2016-09-05 NOTE — H&P (Signed)
[facility] PCCM  HPI: [name] was admitted her for limb ischemia, ended up with a BKA.  Found to have a R lung mass.  Here for biopsy today.  He is a smoker.  Past Medical History:  Diagnosis Date  . Arthritis    "probably; get sore all over" (08/21/2016)  . Dyspnea   . Emphysema of lung ([place])    "from smoking" (08/21/2016)  . History of gout X 1  . HLD (hyperlipidemia)    Edward Summers 08/21/2016  . Hypertension   . Pneumonia 1970s     History reviewed. No pertinent family history.   Social History   Social History  . Marital status: Divorced    Spouse name: N/A  . Number of children: N/A  . Years of education: N/A   Occupational History  . Not on file.   Social History Main Topics  . Smoking status: Former Smoker    Packs/day: 1.50    Years: 57.00    Types: Cigarettes    Quit date: 07/06/2016  . Smokeless tobacco: Never Used  . Alcohol use Yes     Comment: 08/21/2016 "quit ~ 40 yr ago"  . Drug use: No  . Sexual activity: Not Currently   Other Topics Concern  . Not on file   Social History Narrative  . No narrative on file     Allergies  Allergen Reactions  . Zithromax [Azithromycin] Other (See Comments)    Side pain     @encmedstart @ Vitals:   09/05/16 1300 09/05/16 1305 09/05/16 1310 09/05/16 1315  BP: (!) 121/44 (!) 111/57 (!) 112/36   Pulse: (!) 56 (!) 54 (!) 55 63  Resp: (!) 21 20 16  (!) 21  Temp:      TempSrc:      SpO2: 98% 99% 100% 98%  Weight:      Height:       General:  Resting comfortably in bed HENT: NCAT OP clear PULM: poor air movement on R , normal effort CV: RRR, no mgr GI: BS+, soft, nontender MSK: normal bulk and tone, R BKA Neuro: awake, alert, no distress, MAEW   CBC    Component Value Date/Time   WBC 22.9 (H) 09/05/2016 0229   RBC 3.36 (L) 09/05/2016 0229   HGB 9.3 (L) 09/05/2016 0229   HCT 28.9 (L) 09/05/2016 0229   PLT 197 09/05/2016 0229   MCV 86.0 09/05/2016 0229   MCH 27.7 09/05/2016 0229   MCHC 32.2 09/05/2016 0229   RDW 17.2 (H) 09/05/2016 0229   LYMPHSABS 2.0 08/23/2016 0120   MONOABS 1.1 (H) 08/23/2016 0120   EOSABS 0.0 08/23/2016 0120   BASOSABS 0.0 08/23/2016 0120   Impression/plan: Lung mass: plan bronchoscopy, risks, benefits discussed yesterday, he is willing to proceed.  Edward Awkward, MD [place] [facility] Pager: [phone] Cell: [phone] After 3pm or if no response, call [phone]

## 2016-09-05 NOTE — Progress Notes (Signed)
Progress Note  Patient Name: Edward Summers Date of Encounter: 09/05/2016  Primary Cardiologist: Dr. Debara Pickett  Subjective   No chest pain or dyspnea  Inpatient Medications    Scheduled Meds: . amiodarone  200 mg Oral BID  . atorvastatin  40 mg Oral q1800  . dextromethorphan-guaiFENesin  1 tablet Oral BID  . docusate sodium  100 mg Oral Daily  . enoxaparin (LOVENOX) injection  70 mg Subcutaneous Q12H  . feeding supplement  1 Container Oral BID BM  . feeding supplement (PRO-STAT SUGAR FREE 64)  30 mL Oral BID  . furosemide  40 mg Oral Daily  . metoprolol tartrate  12.5 mg Oral BID  . mometasone-formoterol  2 puff Inhalation BID  . pantoprazole  40 mg Oral Daily  . predniSONE  20 mg Oral Q breakfast   And  . [START ON 09/06/2016] predniSONE  10 mg Oral Q breakfast  . sodium chloride flush  3 mL Intravenous Q12H   Continuous Infusions: . sodium chloride    . sodium chloride    . lactated ringers 10 mL/hr at 08/21/16 1709  . lactated ringers 10 mL/hr at 08/28/16 1126  . magnesium sulfate 1 - 4 g bolus IVPB     PRN Meds: sodium chloride, sodium chloride, acetaminophen **OR** acetaminophen, acetaminophen, alum & mag hydroxide-simeth, bisacodyl, guaiFENesin-dextromethorphan, hydrALAZINE, hydrALAZINE, ipratropium, labetalol, levalbuterol, magnesium sulfate 1 - 4 g bolus IVPB, ondansetron (ZOFRAN) IV, oxyCODONE-acetaminophen, phenol, senna-docusate, sodium chloride flush   Vital Signs    Vitals:   09/04/16 1935 09/04/16 2132 09/05/16 0434 09/05/16 0500  BP:  (!) 99/55 (!) 97/51   Pulse:  64 (!) 56 (!) 58  Resp:  (!) 26 19 (!) 24  Temp:  98.2 F (36.8 C) 99 F (37.2 C)   TempSrc:  Oral Oral   SpO2: 98% 95% 93% 92%  Weight:      Height:        Intake/Output Summary (Last 24 hours) at 09/05/16 0915 Last data filed at 09/05/16 0436  Gross per 24 hour  Intake              240 ml  Output             1820 ml  Net            -1580 ml   Filed Weights   08/21/16 1716  08/21/16 2258 08/28/16 1123  Weight: 62.6 kg (138 lb) 71.2 kg (156 lb 14.4 oz) 70.8 kg (156 lb)    Telemetry    Sinus bradycardia- Personally Reviewed   Physical Exam   General: Well developed, well nourished, chronically ill appearing NAD Head: Normal with normal eyelids Neck: Supple, no JVD Lungs:  Diminished BS bases; exp wheeze noted Heart: RRR, no murmur Abdomen: Soft, NT/ND, no masses Extremities: 2 + pitting edema left. Right BKA.  Neuro: No focal findings   Labs    Chemistry  Recent Labs Lab 09/03/16 0426 09/04/16 0221 09/05/16 0229  NA 130* 132* 130*  K 5.0 3.4* 3.6  CL 100* 100* 100*  CO2 25 26 22   GLUCOSE 94 103* 106*  BUN 12 8 10   CREATININE 0.65 0.67 0.59*  CALCIUM 7.4* 7.3* 7.2*  GFRNONAA >60 >60 >60  GFRAA >60 >60 >60  ANIONGAP 5 6 8      Hematology  Recent Labs Lab 09/03/16 0840 09/04/16 0221 09/05/16 0229  WBC 23.3* 21.1* 22.9*  RBC 3.33* 3.48* 3.36*  HGB 9.9* 9.9* 9.3*  HCT 28.5* 30.2*  28.9*  MCV 85.6 86.8 86.0  MCH 29.7 28.4 27.7  MCHC 34.7 32.8 32.2  RDW 17.7* 17.4* 17.2*  PLT 211 176 197    Radiology    Dg Chest Port 1 View  Result Date: 09/02/2016 CLINICAL DATA:  Shortness of breath EXAM: PORTABLE CHEST 1 VIEW COMPARISON:  Chest x-ray of 08/31/2016 and 07/04/2016 FINDINGS: The chest x-ray of 07/04/2016 shows a clear right lung base. However the persistence of right basilar volume loss in abnormality the right hilum is worrisome for possible central endobronchial process. Also there may be a small right pleural effusion present. CT of the chest with IV contrast media is recommended to assess further. The left lung is clear. Mild cardiomegaly is stable. No bony abnormality is seen. IMPRESSION: 1. Persistent abnormal opacity at the right lung base with abnormality of the right hilum and infrahilar region. Cannot exclude a central endobronchial process. Recommend CT of the chest with IV contrast media. 2. Small right pleural effusion.  Electronically Signed   By: Ivar Drape M.D.   On: 09/02/2016 09:02    Cardiac Studies   Echocardiogram: 08/2016 Study Conclusions  - Left ventricle: The cavity size was normal. Systolic function was normal. The estimated ejection fraction was in the range of 60% to 65%. Wall motion was normal; there were no regional wall motion abnormalities. - Aortic valve: Transvalvular velocity was within the normal range. There was no stenosis. There was no regurgitation. - Mitral valve: Transvalvular velocity was within the normal range. There was no evidence for stenosis. There was no regurgitation. - Right ventricle: The cavity size was normal. Wall thickness was normal. Systolic function was normal. - Atrial septum: No defect or patent foramen ovale was identified by color flow Doppler. - Tricuspid valve: There was no regurgitation.  Patient Profile     76 y.o. male with PMH of HTN and HLD admitted for acute limb ischemia. Cardiology consulted for atrial fibrillation with RVR.   Assessment & Plan    1. Post-operative Atrial Fibrillation -change amiodarone to 200 mg daily. Continue metoprolol. Apixaban on hold for broch/bx; resume following procedure when ok with pulmonary.   2. Hemoptysis - No recurrence. CT scan suggestive of lung cancer. Bronch today. Resume anticoagulation when safe and pulmonary evaluation complete.  3. 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 Per vascular surgery. - continue statin therapy. No ASA secondary to need for Eliquis.    4. Postoperative volume excess - Patient remains volume overloaded on exam; continue present dose of lasix and follow renal function.   5. Hypokalemia - improved  6. Abnormal CXR - CT scan suggests lung cancer. Pulmonary evaluation in process.  Signed, Kirk Ruths , MD 9:15 AM 09/05/2016

## 2016-09-05 NOTE — Progress Notes (Signed)
  Radiation Oncology         [phone] ________________________________  Name: [name] MRN: [id number]  Date: 08/21/2016  DOB: Apr 28, 1940  Chart Note:  I reviewed this patient's most recent findings and wanted to take a minute to document my impression.  I was contacted by pulmonary critical care medicine to discuss his situation. The patient has a right lower lung mass and on bronchoscopy as near occlusion of the right lower lobe bronchus rapidly moving towards occlusion of the right mainstem bronchus. Malignancy is suspected clinically and biopsies are pending. Given the rapid progression of disease and the potential for airway jeopardy, the patient will be transferred to [facility]. Upon transfer, I will evaluate the patient, likely on Saturday morning, September 1. It is with the intent to proceed with emergent radiation treatment at 10 AM on Saturday, September 1. I anticipate delivering fractionated radiation to the central chest over the course of this weekend for palliation of airway occlusion.  ________________________________  [name] [name], M.D.

## 2016-09-05 NOTE — Op Note (Signed)
PCCM Video Bronchoscopy Procedure Note  The patient was informed of the risks (including but not limited to bleeding, infection, respiratory failure, lung injury, tooth/oral injury) and benefits of the procedure and gave consent, see chart.  Indication: Lung mass, hemoptysis  Post Procedure Diagnosis: Lung tumor completely occluding R mainstem bronchus  Location: [facility]  Condition pre procedure: Chronically ill, stable  Medications for procedure: Versed IV 2mg , Fentanyl 64mcg IV  Procedure description: The bronchoscope was introduced through the left nare and passed to the bilateral lungs to the level of the subsegmental bronchi throughout the tracheobronchial tree.  Airway exam revealed a large fungating mass in the right mainstem bronchus which was completely occluding the airway.  See image below.  Procedures performed:  1) Brushing of the mass in the right mainstem bronchus x1; even with one brush she had significant bleeding into the left mainstem; epinephrine 3cc was administered with adequate hemostasis  Specimens sent:  1) Brushing R mainstem bronchus: cytology 2) Washing R mainstem bronchus: cytology  Condition post procedure: Stable  EBL: 91-66MA  Complications: none immediate  Recommendations: Given the nature of this mass, rapid growth of the lesion and the location near the carina I think the best approach is to perform Radiation Therapy.  Will discuss with thoracic surgery.    Roselie Awkward, MD [place] [facility] Pager: [phone] Cell: [phone] After 3pm or if no response, call [phone]     Media Information     Document Information   Photos  Bronchscopy   09/05/2016 13:56  Attached To:  Hospital Encounter on 08/21/16  Source Information   Erick Colace, NP  [facility]

## 2016-09-05 NOTE — Progress Notes (Signed)
Discussed with Dr. Donzetta Matters by phone. I will plan follow up on Monday. Noted bronch results. I did explain to Dr. Donzetta Matters that we do have pts admitted to inpt rehab and transport to [place] daily for radiation via [facility]  while receiving rehab services. [phone]

## 2016-09-05 NOTE — Progress Notes (Signed)
Received patient back for pulmonary BP (!) 117/49   Pulse (!) 59   Temp 98.1 F (36.7 C) (Oral)   Resp 19   Ht 5\' 7"  (1.702 m)   Wt 70.8 kg (156 lb)   SpO2 95%   BMI 24.43 kg/m  Patient still complains of shortness of breath.  Son at bedside, Dr. Lake Bells in to see patient.  Will continue to monitor, daily meds caught up.

## 2016-09-05 NOTE — Progress Notes (Signed)
Video Bronchocsopy done Intervention Bronchial washing Intervention Bronchial brushing done

## 2016-09-05 NOTE — Progress Notes (Signed)
OT Cancellation Note  Patient Details Name: Edward Summers MRN: [id number] DOB: [birth year]   Cancelled Treatment:    Reason Eval/Treat Not Completed: Patient at procedure or test/ unavailable . Pt down for bronch. [name] 09/05/2016, 1:45 PM [phone]

## 2016-09-05 NOTE — Progress Notes (Signed)
SLP Cancellation Note  Patient Details Name: Edward Summers MRN: [id number] DOB: 05-02-1940   Cancelled treatment:         Unable to have swallow assessment due to bronchoscopy scheduled today at 1300. RN to page when pt back.    [name] 09/05/2016, 10:25 AM  [name] [name] [phone]

## 2016-09-05 NOTE — Progress Notes (Addendum)
  Progress Note  SUBJECTIVE:    Breathing is ok. No chest pain.   OBJECTIVE:   Vitals:   09/05/16 0434 09/05/16 0500  BP: (!) 97/51   Pulse: (!) 56 (!) 58  Resp: 19 (!) 24  Temp: 99 F (37.2 C)   SpO2: 93% 92%    Intake/Output Summary (Last 24 hours) at 09/05/16 0733 Last data filed at 09/05/16 0436  Gross per 24 hour  Intake              240 ml  Output             1820 ml  Net            -1580 ml   Decreased breath sounds right, clear left RRR Right BKA staples intact. Incision is clean. Left leg swelling.   ASSESSMENT/PLAN:   76 y.o. male is s/p: right BKA 8 Days Post-Op   Lung mass: for bronch today with pulmonary A fib: remains in NSR, eliquis and lovenox held for bronch. S/p BKA: incision healing well.  Difficulty swallowing: SLP consulted LLE edema: continue daily lasix. Hypokalemia: resolved.  Dispo: To CIR soon.   Alvia Grove 09/05/2016 7:33 AM -- LABS:   CBC    Component Value Date/Time   WBC 22.9 (H) 09/05/2016 0229   HGB 9.3 (L) 09/05/2016 0229   HCT 28.9 (L) 09/05/2016 0229   PLT 197 09/05/2016 0229    BMET    Component Value Date/Time   NA 130 (L) 09/05/2016 0229   K 3.6 09/05/2016 0229   CL 100 (L) 09/05/2016 0229   CO2 22 09/05/2016 0229   GLUCOSE 106 (H) 09/05/2016 0229   BUN 10 09/05/2016 0229   CREATININE 0.59 (L) 09/05/2016 0229   CALCIUM 7.2 (L) 09/05/2016 0229   GFRNONAA >60 09/05/2016 0229   GFRAA >60 09/05/2016 0229    COAG Lab Results  Component Value Date   INR 1.22 09/05/2016   INR 2.07 08/25/2016   INR 1.14 08/21/2016   No results found for: PTT  ANTIBIOTICS:   Anti-infectives    Start     Dose/Rate Route Frequency Ordered Stop   08/28/16 0000  ceFAZolin (ANCEF) IVPB 1 g/50 mL premix    Comments:  Send with pt to OR   1 g 100 mL/hr over 30 Minutes Intravenous On call 08/27/16 1039 08/28/16 1258   08/23/16 1345  levofloxacin (LEVAQUIN) tablet 750 mg     750 mg Oral Daily 08/23/16 1344 08/28/16  0959   08/21/16 2359  cefUROXime (ZINACEF) 1.5 g in dextrose 5 % 50 mL IVPB     1.5 g 100 mL/hr over 30 Minutes Intravenous Every 12 hours 08/21/16 2254 08/22/16 1202   08/21/16 2300  doxycycline (VIBRA-TABS) tablet 100 mg  Status:  Discontinued     100 mg Oral Every 12 hours 08/21/16 2132 08/23/16 1344       Virgina Jock, PA-C [facility] Office: [phone] Pager: [phone] 09/05/2016 7:33 AM  I have independently interviewed and examined patient and agree with PA assessment and plan above. Will restart lovenox after procedure. Rehab pending bronch and swallow eval.   Krystyna Cleckley C. Donzetta Matters, MD [facility] [place] Office: [phone] Pager: [phone]

## 2016-09-05 NOTE — Progress Notes (Signed)
   Discussed results of bronchoscopy with Dr. Lake Bells and patient. Will transfer to [facility] under care of [facility]. If he is still inpatient in 2 weeks we can remove staples otherwise he will f/u in office to have them removed from Right bka site.   Trendon Zaring C. Donzetta Matters, MD [facility] [place] Office: [phone] Pager: [phone]

## 2016-09-06 ENCOUNTER — Inpatient Hospital Stay
Admit: 2016-09-06 | Discharge: 2016-09-06 | Disposition: A | Discharge status: Home / Self Care | Payer: Self-pay | Attending: Radiation Oncology | Admitting: Radiation Oncology

## 2016-09-06 DIAGNOSIS — I5031 Acute diastolic (congestive) heart failure: Secondary | ICD-10-CM

## 2016-09-06 NOTE — Progress Notes (Addendum)
Name: Edward Summers MRN: [id number] DOB: 04-08-1940    ADMISSION DATE:  08/21/2016 CONSULTATION DATE:  8/30  REFERRING MD :  [facility]  CHIEF COMPLAINT:  Lung mass   BRIEF PATIENT DESCRIPTION: 76yo male former smoker (52 pack years, quit 6 weeks ago) with hx COPD, HTN who presented 8/16 with BLE swelling and painful, cold R foot.  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  Post op had SOB and CT chest to evaluate this revealed multiple lung masses, largest R side with post-obstructive pneumonitis.  PCCM consulted.    SIGNIFICANT EVENTS  8/23>> R BKA for limb ischemia 8/31 > bronch for rt lung mass 9/1 > started radiation  STUDIES:  CT chest 8/30>>> endobronchial obstruction over the right mainstem with right lower lobe, hilar mass with mediastinal adenopathy and mediastinal invasion. I have reviewed the images personally  HISTORY OF PRESENT ILLNESS:  76yo male former smoker (52 pack years) with hx COPD, HTN who presented 8/16 with BLE swelling and painful, cold R foot.  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  Post op had SOB and CT chest to evaluate this revealed multiple lung masses, largest R side with post-obstructive pneumonitis.  PCCM consulted.    Underwent bronchoscopy on 8/31. Transfer to [facility] for initiation of radiation treatment He underwent his first treatment today without any issues. He is currently on room air resting comfortably.   PAST MEDICAL HISTORY :   has a past medical history of Arthritis; Dyspnea; Emphysema of lung ([place]); History of gout (X 1); HLD (hyperlipidemia); Hypertension; and Pneumonia (1970s).  has a past surgical history that includes Lower extremity angiogram (Right, 08/21/2016); Thrombectomy (Right, 08/21/2016); Back surgery; Lumbar disc surgery; Tonsillectomy; Cataract extraction w/ intraocular lens implant (Right); lower extremity angiogram (Right, 08/21/2016);  Embolectomy (Right, 08/21/2016); Lower Extremity Angiography (N/A, 08/25/2016); and Amputation (Right, 08/28/2016). Prior to Admission medications   Medication Sig Start Date End Date Taking? Authorizing Provider  atorvastatin (LIPITOR) 40 MG tablet Take 40 mg by mouth at bedtime.  05/15/16  Yes [provider]  lisinopril (PRINIVIL,ZESTRIL) 10 MG tablet Take 10 mg by mouth at bedtime.  07/26/16  Yes [provider]  predniSONE (DELTASONE) 10 MG tablet Take 10-30 mg by mouth See admin instructions. Take 30 mg by mouth daily for 3 days, take 20 mg by mouth daily for 3 days, then take 10 mg by mouth daily for 3 days. 08/20/16  Yes [provider]   Allergies  Allergen Reactions  . Zithromax [Azithromycin] Other (See Comments)    Side pain    FAMILY HISTORY:  family history is not on file. SOCIAL HISTORY:  reports that he quit smoking about 2 months ago. His smoking use included Cigarettes. He has a 85.50 pack-year smoking history. He has never used smokeless tobacco. He reports that he drinks alcohol. He reports that he does not use drugs.  REVIEW OF SYSTEMS:   As per HPI - All other systems reviewed and were neg.    SUBJECTIVE:   VITAL SIGNS: Temp:  [98.2 F (36.8 C)-98.7 F (37.1 C)] 98.2 F (36.8 C) (09/01 0545) Pulse Rate:  [51-69] 62 (09/01 0921) Resp:  [15-22] 20 (09/01 0545) BP: (91-117)/(40-62) 106/56 (09/01 0918) SpO2:  [94 %-100 %] 97 % (09/01 0545) Weight:  [142 lb 6.7 oz (64.6 kg)] 142 lb 6.7 oz (64.6 kg) (08/31 2049)  PHYSICAL EXAMINATION:  Gen:  No acute distress HEENT:  EOMI, sclera anicteric Neck:     No masses; no thyromegaly Lungs:    Reduced breath sounds on the right; normal respiratory effort CV:         Regular rate and rhythm; no murmurs Abd:      + bowel sounds; soft, non-tender; no palpable masses, no distension Ext:    No edema; adequate peripheral perfusion Skin:      Warm and dry; no rash Neuro: alert and oriented x  3 Psych: normal mood and affect  Recent Labs Lab 09/03/16 0426 09/04/16 0221 09/05/16 0229  NA 130* 132* 130*  K 5.0 3.4* 3.6  CL 100* 100* 100*  CO2 25 26 22   BUN 12 8 10   CREATININE 0.65 0.67 0.59*  GLUCOSE 94 103* 106*    Recent Labs Lab 09/03/16 0840 09/04/16 0221 09/05/16 0229  HGB 9.9* 9.9* 9.3*  HCT 28.5* 30.2* 28.9*  WBC 23.3* 21.1* 22.9*  PLT 211 176 197   No results found.  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 esophageal obstruction.  S/p bronch 8/31- pt tolerated well  Started on radiation therapy today  PLAN -  Continue bronchodilator  Continue radiation treatment Follow on path results from bronchoscopy  PCCM will be available as needed over the weekend. Please call with any questions.  Marshell Garfinkel MD [facility] Pager [phone] If no answer or after 3pm call: [phone] 09/06/2016, 2:11 PM

## 2016-09-06 NOTE — Progress Notes (Signed)
Left foot continues with +3 pitting edema. Foot is warm, no redness. Pulse present using doppler.

## 2016-09-06 NOTE — Progress Notes (Signed)
Patient ID: Edward Summers, male   DOB: Aug 15, 1940, 76 y.o.   MRN: [id number]    PROGRESS NOTE    Edward Summers  OZD:[id number] DOB: 1940-06-03 DOA: 08/21/2016  PCP: Jilda Panda, MD   Brief Narrative:  76yo male former smoker (52 pack years, quit 6 weeks ago) with hx COPD, HTN who presented 8/16 with BLE swelling and painful, cold R foot.  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  Post op had SOB and CT chest to evaluate this revealed multiple lung masses, largest R side with post-obstructive pneumonitis.  PCCM consulted.    SIGNIFICANT EVENTS  08/23 -  R BKA for limb ischemia 08/31 -  bronch for rt lung mass, transferred to [facility] for initiation of radiation therapy  09/01 -  started radiation, [facility] took over as primary attending team   STUDIES:  CT chest 8/30 >>> endobronchial obstruction over the right mainstem with right lower lobe, hilar mass with mediastinal adenopathy and mediastinal invasion.  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 Esophageal obstruction - s/p bronch 8/31, follow up on path report  - started radiation therapy 9/1 - continue to provide BD's as needed    New onset atrial fibrillation ([place]) - has been on NSR with amiodarone - will plan on discharging on 200 mg PO QD - also plan on discharging on Eliquis     Hypokalemia - supplemented and now WNL    Hyponatremia  - monitor for now    Acute diastolic CHF - continue diuresis as renal function tolerates - weight trend in the past 72 hours  Filed Weights   08/21/16 2258 08/28/16 1123 09/05/16 2049  Weight: 71.2 kg (156 lb 14.4 oz) 70.8 kg (156 lb) 64.6 kg (142 lb 6.7 oz)  - continue daily weight, strict I/O    Essential hypertension - reasonable inpatient control     COPD with acute exacerbation ([place]) - cont BD's as needed     Ischemic foot, right, PVD - 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,  management per vascular surgery    DVT prophylaxis: Lovenox SQ Code Status: Full Family Communication: Patient at bedside  Disposition Plan: disposition plan   Consultants:   PCCM  Vascular surgery  Rad oncology   Procedures:   R BKA 8/23  Bronch 8/31  Antimicrobials:   None   Subjective: No events overnight   Objective: Vitals:   09/06/16 0918 09/06/16 0921 09/06/16 1435 09/06/16 2019  BP: (!) 106/56  (!) 100/55   Pulse: 64 62 62   Resp:   17   Temp:   98.5 F (36.9 C)   TempSrc:   Oral   SpO2:   97% 96%  Weight:      Height:        Intake/Output Summary (Last 24 hours) at 09/06/16 2114 Last data filed at 09/06/16 1824  Gross per 24 hour  Intake           911.83 ml  Output              975 ml  Net           -63.17 ml   Filed Weights   08/21/16 2258 08/28/16 1123 09/05/16 2049  Weight: 71.2 kg (156 lb 14.4 oz) 70.8 kg (156 lb) 64.6 kg (142 lb 6.7 oz)    Examination:  General exam: Appears calm and  comfortable  Respiratory system: Respiratory effort normal. Rhonchi bilaterally  Cardiovascular system: S1 & S2 heard, RRR. No JVD, rubs, gallops or clicks.  Gastrointestinal system: Abdomen is nondistended, soft and nontender. No organomegaly or masses felt.  Central nervous system: Alert and oriented. No focal neurological deficits. Extremities: R BKA, LLE edema, upper extremities edema   Data Reviewed: I have personally reviewed following labs and imaging studies  CBC:  Recent Labs Lab 09/01/16 0226 09/02/16 0345 09/03/16 0840 09/04/16 0221 09/05/16 0229  WBC 25.7* 22.1* 23.3* 21.1* 22.9*  HGB 9.4* 9.4* 9.9* 9.9* 9.3*  HCT 28.1* 28.5* 28.5* 30.2* 28.9*  MCV 85.9 85.8 85.6 86.8 86.0  PLT 187 172 211 176 568   Basic Metabolic Panel:  Recent Labs Lab 09/02/16 0345 09/03/16 0426 09/04/16 0221 09/05/16 0229  NA 130* 130* 132* 130*  K 3.1* 5.0 3.4* 3.6  CL 98* 100* 100* 100*  CO2 23 25 26 22   GLUCOSE 95 94 103* 106*  BUN 7 12 8 10     CREATININE 0.59* 0.65 0.67 0.59*  CALCIUM 7.2* 7.4* 7.3* 7.2*  MG  --  1.9  --   --    Coagulation Profile:  Recent Labs Lab 09/05/16 0229  INR 1.22   Urine analysis:    Component Value Date/Time   COLORURINE YELLOW 09/01/2016 2100   APPEARANCEUR CLEAR 09/01/2016 2100   [place] 1.009 09/01/2016 2100   PHURINE 7.0 09/01/2016 2100   GLUCOSEU NEGATIVE 09/01/2016 2100   [place] NEGATIVE 09/01/2016 2100   [place] NEGATIVE 09/01/2016 2100   [place] NEGATIVE 09/01/2016 2100   [place] NEGATIVE 09/01/2016 2100   NITRITE NEGATIVE 09/01/2016 2100   LEUKOCYTESUR NEGATIVE 09/01/2016 2100    Recent Results (from the past 240 hour(s))  MRSA PCR Screening     Status: None   Collection Time: 08/27/16 10:24 PM  Result Value Ref Range Status   MRSA by PCR NEGATIVE NEGATIVE Final    Comment:        The GeneXpert MRSA Assay (FDA approved for NASAL specimens only), is one component of a comprehensive MRSA colonization surveillance program. It is not intended to diagnose MRSA infection nor to guide or monitor treatment for MRSA infections.       Radiology Studies: No results found.    Scheduled Meds: . amiodarone  200 mg Oral Daily  . atorvastatin  40 mg Oral q1800  . butamben-tetracaine-benzocaine  1 spray Topical Once  . dextromethorphan-guaiFENesin  1 tablet Oral BID  . docusate sodium  100 mg Oral Daily  . enoxaparin (LOVENOX) injection  70 mg Subcutaneous Q12H  . feeding supplement  1 Container Oral BID BM  . feeding supplement (PRO-STAT SUGAR FREE 64)  30 mL Oral BID  . furosemide  40 mg Oral Daily  . lidocaine  1 application Topical Once  . metoprolol tartrate  12.5 mg Oral BID  . mometasone-formoterol  2 puff Inhalation BID  . pantoprazole  40 mg Oral Daily  . predniSONE  10 mg Oral Q breakfast  . sodium chloride flush  3 mL Intravenous Q12H   Continuous Infusions: . sodium chloride    . sodium chloride    . sodium chloride 10 mL/hr at 09/05/16 2238  .  lactated ringers 10 mL/hr at 08/21/16 1709  . lactated ringers 10 mL/hr at 08/28/16 1126  . magnesium sulfate 1 - 4 g bolus IVPB       LOS: 16 days    Time spent: 25 minutes    [employer],  MD [facility] Pager [phone]  If 7PM-7AM, please contact night-coverage [email or web address] Password [facility] 09/06/2016, 9:14 PM

## 2016-09-06 NOTE — Progress Notes (Signed)
Cardiology Rounding Note  Patient Name: Edward Summers Date of Encounter: 09/06/2016  Primary Cardiologist: Dr Debara Pickett   Subjective   The patient is doing well today.  At this time, the patient denies chest pain, shortness of breath, or any new concerns.  Inpatient Medications    Scheduled Meds: . amiodarone  200 mg Oral Daily  . atorvastatin  40 mg Oral q1800  . butamben-tetracaine-benzocaine  1 spray Topical Once  . dextromethorphan-guaiFENesin  1 tablet Oral BID  . docusate sodium  100 mg Oral Daily  . enoxaparin (LOVENOX) injection  70 mg Subcutaneous Q12H  . feeding supplement  1 Container Oral BID BM  . feeding supplement (PRO-STAT SUGAR FREE 64)  30 mL Oral BID  . furosemide  40 mg Oral Daily  . lidocaine  1 application Topical Once  . metoprolol tartrate  12.5 mg Oral BID  . mometasone-formoterol  2 puff Inhalation BID  . pantoprazole  40 mg Oral Daily  . predniSONE  10 mg Oral Q breakfast  . sodium chloride flush  3 mL Intravenous Q12H   Continuous Infusions: . sodium chloride    . sodium chloride    . sodium chloride 10 mL/hr at 09/05/16 2238  . lactated ringers 10 mL/hr at 08/21/16 1709  . lactated ringers 10 mL/hr at 08/28/16 1126  . magnesium sulfate 1 - 4 g bolus IVPB     PRN Meds: sodium chloride, sodium chloride, acetaminophen **OR** acetaminophen, acetaminophen, alum & mag hydroxide-simeth, bisacodyl, guaiFENesin-dextromethorphan, hydrALAZINE, hydrALAZINE, ipratropium, labetalol, levalbuterol, magnesium sulfate 1 - 4 g bolus IVPB, ondansetron (ZOFRAN) IV, oxyCODONE-acetaminophen, phenol, phenylephrine, senna-docusate, sodium chloride flush   Vital Signs    Vitals:   09/05/16 1600 09/05/16 2049 09/05/16 2220 09/06/16 0545  BP: 116/62 (!) 107/58  (!) 94/55  Pulse: 62 69  (!) 51  Resp: (!) 22 20  20   Temp: 98.6 F (37 C) 98.7 F (37.1 C)  98.2 F (36.8 C)  TempSrc: Oral Oral  Oral  SpO2: 94% 95% 95% 97%  Weight:  142 lb 6.7 oz (64.6 kg)      Height:  5\' 7"  (1.702 m)      Intake/Output Summary (Last 24 hours) at 09/06/16 0756 Last data filed at 09/06/16 0655  Gross per 24 hour  Intake              491 ml  Output              875 ml  Net             -384 ml   Filed Weights   08/21/16 2258 08/28/16 1123 09/05/16 2049  Weight: 156 lb 14.4 oz (71.2 kg) 156 lb (70.8 kg) 142 lb 6.7 oz (64.6 kg)    Physical Exam    GEN- The patient is chronically ill appearing, alert and oriented x 3 today.   Head- normocephalic, atraumatic Eyes-  Sclera clear, conjunctiva pink Ears- hearing intact Oropharynx- clear Neck- supple Lungs- decreased BS at bases Heart- Regular rate and rhythm  GI- soft, NT, ND, + BS Extremities- no clubbing, cyanosis,+ dependant edema in arms and legs,  R BKA Psych- flat affect Neuro- strength and sensation are intact  Labs    CBC  Recent Labs  09/04/16 0221 09/05/16 0229  WBC 21.1* 22.9*  HGB 9.9* 9.3*  HCT 30.2* 28.9*  MCV 86.8 86.0  PLT 176 409   Basic Metabolic Panel  Recent Labs  09/04/16 0221 09/05/16 0229  NA  132* 130*  K 3.4* 3.6  CL 100* 100*  CO2 26 22  GLUCOSE 103* 106*  BUN 8 10  CREATININE 0.67 0.59*  CALCIUM 7.3* 7.2*   Liver Function   Telemetry    Sinus rhythm with occasional sinus bradycardia (personally reviewed)   Patient Profile     Edward Summers is a 76 y.o. male with a past medical history significant for acute limb ischemia.  Cardiology has been consulted for afib with RVR  Assessment & Plan    1. afib Maintaining sinus rhythm with amiodarone No changes today Reduce amiodarone to 200mg  daily at discharge Resume eliquis at discharge or when able  2. Acute diastolic dysfunction Continue gentle diuresis as renal function allows  3. Hypokalemia Replete   Signed, Thompson Grayer, MD  09/06/2016, 7:56 AM

## 2016-09-06 NOTE — Progress Notes (Signed)
Pt able to turn self Q2h. Pt compliant.

## 2016-09-06 NOTE — Consult Note (Signed)
[place]         [phone] ________________________________  Initial inpatient Consultation  Name: Edward Summers MRN: [id number]  Date: 08/21/2016  DOB: [birth year]  REFERRING PHYSICIAN: No ref. 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 Ischemic foot I99.8   2. Dyspnea R06.00 DG Chest 2 View    DG Chest 2 View    DG CHEST PORT 1 VIEW    DG CHEST PORT 1 VIEW  3. Ischemic pain of foot, right M79.671    I99.9   4. Acute respiratory distress R06.03 DG Chest Port 1 View    DG Chest Port 1 View    HISTORY OF PRESENT ILLNESS::Edward Summers is a 76 y.o. 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 Due to pulmonary symptoms, chest x-ray imaging was performed showing right lower lung consolidation of new onset. Chest CT confirmed the presence of a late right lower lung mass with obstruction of the bronchus intermedius and extension to the right mainstem bronchus. Bronchoscopy yesterday confirmed the presence of an endobronchial lesion in the right lower lung suspected represent a malignant process. Biopsies were performed as well as brushings. These are currently pending. However, due to the potentially catastrophic nature of sudden right mainstem bronchus or carina obstruction, the patient was referred for emergent radiation treatment.  Marland Kitchen  PREVIOUS RADIATION THERAPY: No  Past Medical History:  Diagnosis Date  . Arthritis    "probably; get sore all over" (08/21/2016)  . Dyspnea   . Emphysema of lung ([place])    "from smoking" (08/21/2016)  . History of gout X 1  . HLD (hyperlipidemia)    [name] 08/21/2016  . Hypertension   . Pneumonia 1970s  :  Past Surgical History:  Procedure Laterality Date  . AMPUTATION Right 08/28/2016   Procedure: AMPUTATION BELOW KNEE RIGHT;  Surgeon:  Waynetta Sandy, MD;  Location: [facility];  Service: Vascular;  Laterality: Right;  . BACK SURGERY    . CATARACT EXTRACTION W/ INTRAOCULAR LENS IMPLANT Right   . EMBOLECTOMY Right 08/21/2016   Procedure: Right Lower Extremity Thromboembolectomy with Injection of Alteplase into Right Anterior Tibial Artery and Right Anterior Peroneal Artery;  Surgeon: Waynetta Sandy, MD;  Location: [facility];  Service: Vascular;  Laterality: Right;  . LOWER EXTREMITY ANGIOGRAM Right 08/21/2016   [name] 08/21/2016  . LOWER EXTREMITY ANGIOGRAM Right 08/21/2016   Procedure: LOWER EXTREMITY ANGIOGRAM;  Surgeon: Waynetta Sandy, MD;  Location: [facility];  Service: Vascular;  Laterality: Right;  . LOWER EXTREMITY ANGIOGRAPHY N/A 08/25/2016   Procedure: Lower Extremity Angiography;  Surgeon: Angelia Mould, MD;  Location: [facility];  Service: Cardiovascular;  Laterality: N/A;  . LUMBAR DISC SURGERY     Dr. Arnoldo Morale  . THROMBECTOMY Right 08/21/2016   Thromboembolectomy of right popliteal, pt and AT arteries/notes 08/21/2016  . TONSILLECTOMY    :   Current Facility-Administered Medications:  .  0.9 %  sodium chloride infusion, 500 mL, Intravenous, Once PRN, Theda Sers, Emma M, PA-C .  0.9 %  sodium chloride infusion, 250 mL, Intravenous, PRN, Angelia Mould, MD .  0.9 %  sodium chloride infusion, , Intravenous, Continuous, McQuaid, Douglas B, MD, Last Rate: 10 mL/hr at 09/05/16 2238 .  acetaminophen (TYLENOL) tablet 325-650 mg, 325-650 mg, Oral, Q4H PRN, 650  mg at 08/23/16 1508 **OR** acetaminophen (TYLENOL) suppository 325-650 mg, 325-650 mg, Rectal, Q4H PRN, Theda Sers, Emma M, PA-C .  acetaminophen (TYLENOL) tablet 650 mg, 650 mg, Oral, Q4H PRN, Angelia Mould, MD .  alum & mag hydroxide-simeth (MAALOX/MYLANTA) 200-200-20 MG/5ML suspension 15-30 mL, 15-30 mL, Oral, Q2H PRN, Theda Sers, Emma M, PA-C .  amiodarone (PACERONE) tablet 200 mg, 200 mg, Oral, Daily, Lelon Perla,  MD, 200 mg at 09/05/16 1544 .  atorvastatin (LIPITOR) tablet 40 mg, 40 mg, Oral, q1800, Ulyses Amor, PA-C, 40 mg at 09/05/16 1808 .  bisacodyl (DULCOLAX) EC tablet 5 mg, 5 mg, Oral, Daily PRN, Ulyses Amor, PA-C, 5 mg at 09/03/16 1109 .  butamben-tetracaine-benzocaine (CETACAINE) spray 1 spray, 1 spray, Topical, Once, McQuaid, Douglas B, MD .  dextromethorphan-guaiFENesin ([facility] DM) 30-600 MG per 12 hr tablet 1 tablet, 1 tablet, Oral, BID, Ivor Costa, MD, 1 tablet at 09/06/16 0919 .  docusate sodium (COLACE) capsule 100 mg, 100 mg, Oral, Daily, Laurence Slate M, PA-C, 100 mg at 09/06/16 4235 .  enoxaparin (LOVENOX) injection 70 mg, 70 mg, Subcutaneous, Q12H, Javier Glazier, MD, 70 mg at 09/06/16 0917 .  feeding supplement (BOOST / RESOURCE BREEZE) liquid 1 Container, 1 Container, Oral, BID BM, Waynetta Sandy, MD, 1 Container at 09/05/16 1558 .  feeding supplement (PRO-STAT SUGAR FREE 64) liquid 30 mL, 30 mL, Oral, BID, Waynetta Sandy, MD, 30 mL at 09/06/16 0919 .  furosemide (LASIX) tablet 40 mg, 40 mg, Oral, Daily, Lelon Perla, MD, 40 mg at 09/06/16 0919 .  guaiFENesin-dextromethorphan (ROBITUSSIN DM) 100-10 MG/5ML syrup 15 mL, 15 mL, Oral, Q4H PRN, Ulyses Amor, PA-C, 15 mL at 08/31/16 2304 .  hydrALAZINE (APRESOLINE) injection 5 mg, 5 mg, Intravenous, Q20 Min PRN, Collins, Emma M, PA-C .  hydrALAZINE (APRESOLINE) injection 5 mg, 5 mg, Intravenous, Q20 Min PRN, Angelia Mould, MD .  ipratropium (ATROVENT) nebulizer solution 0.5 mg, 0.5 mg, Nebulization, Q4H PRN, Waynetta Sandy, MD .  labetalol (NORMODYNE,TRANDATE) injection 10 mg, 10 mg, Intravenous, Q10 min PRN, Angelia Mould, MD .  lactated ringers infusion, , Intravenous, Continuous, Roderic Palau, MD, Last Rate: 10 mL/hr at 08/21/16 1709 .  lactated ringers infusion, , Intravenous, Continuous, Duane Boston, MD, Last Rate: 10 mL/hr at 08/28/16 1126 .  levalbuterol  (XOPENEX) nebulizer solution 0.63 mg, 0.63 mg, Nebulization, Q4H PRN, Waynetta Sandy, MD, 0.63 mg at 09/05/16 2220 .  lidocaine (XYLOCAINE) 2 % jelly 1 application, 1 application, Topical, Once, McQuaid, Douglas B, MD .  magnesium sulfate IVPB 2 g 50 mL, 2 g, Intravenous, Daily PRN, Theda Sers, Emma M, PA-C .  metoprolol tartrate (LOPRESSOR) tablet 12.5 mg, 12.5 mg, Oral, BID, Strader, Brittany M, PA-C, 12.5 mg at 09/05/16 2232 .  mometasone-formoterol (DULERA) 100-5 MCG/ACT inhaler 2 puff, 2 puff, Inhalation, BID, Starla Link, Kshitiz, MD, 2 puff at 09/05/16 1923 .  ondansetron (ZOFRAN) injection 4 mg, 4 mg, Intravenous, Q6H PRN, Angelia Mould, MD .  oxyCODONE-acetaminophen (PERCOCET/ROXICET) 5-325 MG per tablet 1-2 tablet, 1-2 tablet, Oral, Q4H PRN, Ulyses Amor, PA-C, 2 tablet at 09/02/16 3614 .  pantoprazole (PROTONIX) EC tablet 40 mg, 40 mg, Oral, Daily, Laurence Slate M, PA-C, 40 mg at 09/06/16 4315 .  phenol (CHLORASEPTIC) mouth spray 1 spray, 1 spray, Mouth/Throat, PRN, Collins, Emma M, PA-C .  phenylephrine (NEO-SYNEPHRINE) 0.25 % nasal spray 1 spray, 1 spray, Each Nare, Q6H PRN, Juanito Doom, MD .  [COMPLETED] predniSONE (DELTASONE) tablet  20 mg, 20 mg, Oral, Q breakfast, 20 mg at 09/05/16 1545 **AND** predniSONE (DELTASONE) tablet 10 mg, 10 mg, Oral, Q breakfast, Trinh, Kimberly A, PA-C, 10 mg at 09/06/16 4081 .  senna-docusate (Senokot-S) tablet 1 tablet, 1 tablet, Oral, QHS PRN, Laurence Slate M, PA-C .  sodium chloride flush (NS) 0.9 % injection 3 mL, 3 mL, Intravenous, Q12H, Angelia Mould, MD, 3 mL at 09/05/16 2233 .  sodium chloride flush (NS) 0.9 % injection 3 mL, 3 mL, Intravenous, PRN, Angelia Mould, MD, 3 mL at 09/05/16 2232:  Allergies  Allergen Reactions  . Zithromax [Azithromycin] Other (See Comments)    Side pain  :  History reviewed. No pertinent family history.:  Social History   Social History  . Marital status: Divorced     Spouse name: N/A  . Number of children: N/A  . Years of education: N/A   Occupational History  . Not on file.   Social History Main Topics  . Smoking status: Former Smoker    Packs/day: 1.50    Years: 57.00    Types: Cigarettes    Quit date: 07/06/2016  . Smokeless tobacco: Never Used  . Alcohol use Yes     Comment: 08/21/2016 "quit ~ 40 yr ago"  . Drug use: No  . Sexual activity: Not Currently   Other Topics Concern  . Not on file   Social History Narrative  . No narrative on file  :  REVIEW OF SYSTEMS:  A 15 point review of systems is documented in the electronic medical record. This was obtained by the nursing staff. However, I reviewed this with the patient to discuss relevant findings and make appropriate changes.  Pertinent items are noted in HPI.   PHYSICAL EXAM:  Blood pressure (!) 106/56, pulse 62, temperature 98.2 F (36.8 C), temperature source Oral, resp. rate 20, height 5\' 7"  (1.702 m), weight 142 lb 6.7 oz (64.6 kg), SpO2 97 %. The patient is in no acute distress this morning. He is alert and oriented. He is responsive to questioning. Respiratory effort is unremarkable. The patient is somewhat diaphoretic. Recent right BKA is unremarkable.  KPS = 30  100 - Normal; no complaints; no evidence of disease. 90   - Able to carry on normal activity; minor signs or symptoms of disease. 80   - Normal activity with effort; some signs or symptoms of disease. 65   - Cares for self; unable to carry on normal activity or to do active work. 60   - Requires occasional assistance, but is able to care for most of his personal needs. 50   - Requires considerable assistance and frequent medical care. 2   - Disabled; requires special care and assistance. 17   - Severely disabled; hospital admission is indicated although death not imminent. 51   - Very sick; hospital admission necessary; active supportive treatment necessary. 10   - Moribund; fatal processes progressing rapidly. 0      - Dead  Karnofsky DA, Abelmann Saxon, Craver LS and Burchenal [facility] [phone]) The use of the nitrogen mustards in the palliative treatment of carcinoma: with particular reference to bronchogenic carcinoma Cancer 1 634-56  LABORATORY DATA:  Lab Results  Component Value Date   WBC 22.9 (H) 09/05/2016   HGB 9.3 (L) 09/05/2016   HCT 28.9 (L) 09/05/2016   MCV 86.0 09/05/2016   PLT 197 09/05/2016   Lab Results  Component Value Date   NA 130 (L) 09/05/2016   K  3.6 09/05/2016   CL 100 (L) 09/05/2016   CO2 22 09/05/2016   Lab Results  Component Value Date   ALT 31 08/21/2016   AST 22 08/21/2016   ALKPHOS 85 08/21/2016   BILITOT 1.2 08/21/2016     RADIOGRAPHY: Dg Chest 2 View  Result Date: 08/23/2016 CLINICAL DATA:  Dyspnea. Hx of smoking. Emphysema of lung, HTN, PNA and diabetes. Pt came into hospital for LLE swelling and numbness. EXAM: CHEST  2 VIEW COMPARISON:  08/21/2016 FINDINGS: Cardiac silhouette is mildly enlarged. No mediastinal or hilar masses. Small, right greater than left, pleural effusions with associated lung base opacity, most likely atelectasis. Right lower lobe pneumonia should be considered likely if there are consistent clinical symptoms. Pleural effusions have increased when compared the prior exam. Remainder of the lungs is clear. No pneumothorax. IMPRESSION: 1. Mild increase in lung base opacity which is likely due to an increase in pleural effusions, right greater than left. 2. Persistent right lung base opacity which may reflect atelectasis, pneumonia or a combination. 3. No evidence of pulmonary edema. Electronically Signed   By: Lajean Manes M.D.   On: 08/23/2016 08:30   Ct Chest W Contrast  Result Date: 09/03/2016 CLINICAL DATA:  Followup of lung nodule. Abnormal chest radiograph, demonstrating possible right hilar soft tissue fullness. EXAM: CT CHEST WITH CONTRAST TECHNIQUE: Multidetector CT imaging of the chest was performed during intravenous contrast  administration. CONTRAST:  23mL ISOVUE-300 IOPAMIDOL (ISOVUE-300) INJECTION 61% COMPARISON:  Multiple chest radiographs, most recent 08/25/2016. Chest CT 11/24/2005. FINDINGS: Cardiovascular: Aortic and branch vessel atherosclerosis. Tortuous thoracic aorta. Mild cardiomegaly, without pericardial effusion. Multivessel coronary artery atherosclerosis. No central pulmonary embolism, on this non-dedicated study. Mediastinum/Nodes: Low right jugular/ supraclavicular adenopathy. Example at 11 mm on image 9/ series 3. Necrotic mediastinal adenopathy, with a right paratracheal node measuring 1.9 cm on image 27/series 3. Right hilar adenopathy including at 2.8 cm. Mass-effect upon the esophagus, secondary to adenopathy. There is a fluid level in the upper esophagus. Lungs/Pleura: Small right and trace left-sided pleural effusions. Mild loculation of right-sided effusion superiorly. Endobronchial obstruction involving the right mainstem bronchus including on image 59/series 4. Central right lower lobe and infrahilar soft tissue mass which is most consistent with primary bronchogenic carcinoma, direct mediastinal invasion, and contiguous adenopathy. In combination, this measures on the order of 8.5 x 6.6 cm on image 78/series 3. Postobstructive pneumonitis throughout the right lower lobe and less so right middle lobes. Right upper lobe patchy airspace and ground-glass opacity is likely related to postobstructive pneumonitis. Left-sided pulmonary nodules, including 8 mm nodule in the left lower lobe on image 91/series 4, similar. A left upper lobe 5 mm nodule on image 50/series 4 is new. Smaller left upper lobe nodules are present on the remote prior. Upper Abdomen: Normal imaged portions of the liver, spleen, stomach, pancreas, gallbladder, right adrenal gland, and right kidney. Left adrenal nodularity is unchanged. An interpolar exophytic left renal lesion measures 2.4 cm and greater than fluid density. 2.1 cm back on  11/24/2005. Probable sebaceous cyst about the anterior left chest wall. Musculoskeletal: Moderate thoracic spondylosis. IMPRESSION: 1. 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 Direct mediastinal invasion, nodal metastasis within the chest/low neck, and endobronchial obstruction. 2. Postobstructive collapse and pneumonitis, as detailed above. 3. Right larger than left pleural effusions with mild loculation involving the right-sided pleural effusion. 4. Upper esophageal fluid level could represent dysmotility or partial obstruction secondary to the mediastinal mass. 5. Coronary artery atherosclerosis. Aortic Atherosclerosis (ICD10-I70.0). 6.  Left renal lesion is technically indeterminate but likely a complex cyst, given presence back in 2007. 7. Left-sided pulmonary nodules, primarily similar and therefore benign. A left upper lobe 5 mm nodule is new and could represent a metastasis. 8. The patient may be predisposed to SVC syndrome. Electronically Signed   By: Abigail Miyamoto M.D.   On: 09/03/2016 16:18   Dg Chest Port 1 View  Result Date: 09/02/2016 CLINICAL DATA:  Shortness of breath EXAM: PORTABLE CHEST 1 VIEW COMPARISON:  Chest x-ray of 08/31/2016 and 07/04/2016 FINDINGS: The chest x-ray of 07/04/2016 shows a clear right lung base. However the persistence of right basilar volume loss in abnormality the right hilum is worrisome for possible central endobronchial process. Also there may be a small right pleural effusion present. CT of the chest with IV contrast media is recommended to assess further. The left lung is clear. Mild cardiomegaly is stable. No bony abnormality is seen. IMPRESSION: 1. Persistent abnormal opacity at the right lung base with abnormality of the right hilum and infrahilar region. Cannot exclude a central endobronchial process. Recommend CT of the chest with IV contrast media. 2. Small right pleural effusion. Electronically Signed   By: Ivar Drape M.D.   On: 09/02/2016  09:02   Dg Chest Port 1 View  Result Date: 08/31/2016 CLINICAL DATA:  Acute respiratory distress.  Productive cough. EXAM: PORTABLE CHEST 1 VIEW COMPARISON:  Radiograph 08/23/2016, additional priors FINDINGS: Progressive volume loss in the right lower lobe with increased right basilar opacity. Improved left basilar aeration. Unchanged heart size and mediastinal contours. No pulmonary edema. No pneumothorax. IMPRESSION: Progressive volume loss in the right lower lobe with increased basilar opacity. This may be due to increased pleural effusion or atelectasis. Recommend continued radiographic follow-up to resolution. Improving left basilar aeration. Electronically Signed   By: Jeb Levering M.D.   On: 08/31/2016 23:07   Dg Chest Portable 1 View  Result Date: 08/21/2016 CLINICAL DATA:  Shortness of breath, bilateral lower extremity edema and discoloration. EXAM: PORTABLE CHEST 1 VIEW COMPARISON:  PA and lateral chest x-ray of July 04, 2016 FINDINGS: The left lung is well-expanded and clear. On the right there is increased density at the lung base. The heart is mildly enlarged. The pulmonary vascularity is normal. There is no pleural effusion. There is calcification in the wall of the aortic arch. The bony thorax exhibits no acute abnormality. IMPRESSION: Right lower lobe atelectasis or pneumonia. Probable underlying COPD or reactive airway disease. Followup PA and lateral chest X-ray is recommended in 3-4 weeks following trial of antibiotic therapy to ensure resolution and exclude underlying malignancy. No evidence of pulmonary edema or pleural effusions. Electronically Signed   By: David  Martinique M.D.   On: 08/21/2016 15:33   Dg Foot 2 Views Left  Result Date: 08/21/2016 Please see detailed radiograph report in office note.  Dg Foot 2 Views Right  Result Date: 08/21/2016 Please see detailed radiograph report in office note.     IMPRESSION: 76 year old gentleman with presumed right lower lung  cancer producing right mainstem bronchus obstruction. Biopsies have been performed and are pending. However, due to the unstable nature of his endobronchial disease, the patient would benefit from emergency radiation treatment to the central mediastinum to help preserve airway patency at the level of the trachea carina and right mainstem bronchus.  PLAN:Today, I talked to the patient and family about the findings and work-up thus far.  We discussed the natural history of airway obstruction from primary lung  cancer and general treatment, highlighting the role of radiotherapy in the management.  We discussed the available radiation techniques, and focused on the details of logistics and delivery.  We reviewed the anticipated acute and late sequelae associated with radiation in this setting.  The patient was encouraged to ask questions that I answered to the best of my ability.    The patient would like to proceed with radiation and will be scheduled for simulation on the linear accelerator to proceed with emergency based radiation treatment this weekend.  I spent 60 minutes minutes face to face with the patient and more than 50% of that time was spent in counseling and/or coordination of care.    ------------------------------------------------   Tyler Pita, MD [place] Director and Director of Stereotactic Radiosurgery Direct Dial: [phone]  Fax: [phone] [email or web address]  Skype  LinkedIn

## 2016-09-06 NOTE — Evaluation (Signed)
Clinical/Bedside Swallow Evaluation Patient Details  Name: Edward Summers MRN: [id number] Date of Birth: 09-28-1940  Today's Date: 09/06/2016 Time: SLP Start Time (ACUTE ONLY): 0751 SLP Stop Time (ACUTE ONLY): 0808 SLP Time Calculation (min) (ACUTE ONLY): 17 min  Past Medical History:  Past Medical History:  Diagnosis Date  . Arthritis    "probably; get sore all over" (08/21/2016)  . Dyspnea   . Emphysema of lung ([place])    "from smoking" (08/21/2016)  . History of gout X 1  . HLD (hyperlipidemia)    Edward Summers 08/21/2016  . Hypertension   . Pneumonia 1970s   Past Surgical History:  Past Surgical History:  Procedure Laterality Date  . AMPUTATION Right 08/28/2016   Procedure: AMPUTATION BELOW KNEE RIGHT;  Surgeon: Edward Sandy, MD;  Location: [facility];  Service: Vascular;  Laterality: Right;  . BACK SURGERY    . CATARACT EXTRACTION W/ INTRAOCULAR LENS IMPLANT Right   . EMBOLECTOMY Right 08/21/2016   Procedure: Right Lower Extremity Thromboembolectomy with Injection of Alteplase into Right Anterior Tibial Artery and Right Anterior Peroneal Artery;  Surgeon: Edward Sandy, MD;  Location: [facility];  Service: Vascular;  Laterality: Right;  . LOWER EXTREMITY ANGIOGRAM Right 08/21/2016   Edward Summers 08/21/2016  . LOWER EXTREMITY ANGIOGRAM Right 08/21/2016   Procedure: LOWER EXTREMITY ANGIOGRAM;  Surgeon: Edward Sandy, MD;  Location: [facility];  Service: Vascular;  Laterality: Right;  . LOWER EXTREMITY ANGIOGRAPHY N/A 08/25/2016   Procedure: Lower Extremity Angiography;  Surgeon: Angelia Mould, MD;  Location: [facility];  Service: Cardiovascular;  Laterality: N/A;  . LUMBAR DISC SURGERY     Dr. Arnoldo Summers  . THROMBECTOMY Right 08/21/2016   Thromboembolectomy of right popliteal, pt and AT arteries/notes 08/21/2016  . TONSILLECTOMY     HPI:  Edward Summers a 76 y.o.male wiith history of HTN, HLD admitted with right lower extremity pain and swelling for 2  weeks and increased dspnea. Quit smoking 5 weeks ago. Underwent surgical thromoembolectomy of rith popliteal PT and AT arteries. Patient developed postprocedure atrial fibrillation with rapid ventricular rate and hypotension. Required right below the knee amputaion on 8/23. Rapid responde called for expectorating copious amounts of bloody sputum. Bronchoscopy performed 8/31 revealed large mass from carina to right lower lobe completely occuding and pt transferred to [facility] to initiate radiation 9/1.   Assessment / Plan / Recommendation Clinical Impression  Pt's primary risk for aspiration currently is newly diagnosed lung lass resulting in frequent dyspnea. Intermittent atypical inhalation immediately post swallow. Initiation and strength of swallow upon observation appeared functional. Mild delayed coughs following po's in conjuntion with observed cough 1 x prior. Pt has upper denture and no lower reporting need for softer meats and smaller pieces. Pt reports pharyngeal globus sensation at times. SLP ordered was Dys 3 with thin liquids, small sips with straws allowed, eat slowly with frequent rest breaks when needed, pills with water. Educated pt on esophageal strategies to decreased symptoms of globus. ST will follow.   SLP Visit Diagnosis: Dysphagia, unspecified (R13.10)    Aspiration Risk   (mild-mod)    Diet Recommendation Dysphagia 3 (Mech soft);Thin liquid   Liquid Administration via: Cup;Straw Medication Administration: Whole meds with liquid Supervision: Patient able to self feed;Intermittent supervision to cue for compensatory strategies Compensations: Slow rate;Small sips/bites;Follow solids with liquid (rest breaks) Postural Changes: Seated upright at 90 degrees    Other  Recommendations Oral Care Recommendations: Oral care BID   Follow up Recommendations  (TBD)  Frequency and Duration min 2x/week  2 weeks       Prognosis Prognosis for Safe Diet Advancement: Fair       Swallow Study   General HPI: Edward Summers a 75 y.o.male wiith history of HTN, HLD admitted with right lower extremity pain and swelling for 2 weeks and increased dspnea. Quit smoking 5 weeks ago. Underwent surgical thromoembolectomy of rith popliteal PT and AT arteries. Patient developed postprocedure atrial fibrillation with rapid ventricular rate and hypotension. Required right below the knee amputaion on 8/23. Rapid responde called for expectorating copious amounts of bloody sputum. Bronchoscopy performed 8/31 revealed large mass from carina to right lower lobe completely occuding and pt transferred to [facility] to initiate radiation 9/1. Type of Study: Bedside Swallow Evaluation Previous Swallow Assessment:  (none) Diet Prior to this Study: NPO Temperature Spikes Noted: No Respiratory Status: Room air History of Recent Intubation: No Behavior/Cognition: Alert;Cooperative;Pleasant mood Oral Cavity Assessment: Dry Oral Care Completed by SLP: Yes Oral Cavity - Dentition: Dentures, top (no lower) Vision: Functional for self-feeding Self-Feeding Abilities: Able to feed self Patient Positioning: Upright in bed Baseline Vocal Quality: Normal Volitional Cough: Strong Volitional Swallow: Able to elicit    Oral/Motor/Sensory Function Overall Oral Motor/Sensory Function: Within functional limits   Ice Chips Ice chips: Not tested   Thin Liquid Thin Liquid: Impaired Presentation: Cup;Straw Oral Phase Impairments:  (none) Oral Phase Functional Implications:  (none) Pharyngeal  Phase Impairments:  (inhalation post swallow intermittently)    Nectar Thick Nectar Thick Liquid: Not tested   Honey Thick Honey Thick Liquid: Not tested   Puree Puree: Within functional limits   Solid   GO   Solid: Within functional limits        Edward Summers 09/06/2016,8:30 AM  Edward Summers.[employer] [phone]

## 2016-09-06 NOTE — Progress Notes (Signed)
  Radiation Oncology         [phone] ________________________________  Name: Edward Summers MRN: [id number]  Date: 08/21/2016  DOB: 07-16-1940  Inpatient  SIMULATION AND TREATMENT PLANNING NOTE  DIAGNOSIS:  76 year old gentleman with right lower lung airway compression at risk for respiratory failure from presumed lung cancer, pending biopsy  NARRATIVE:  The patient was brought to the [facility].  Identity was confirmed.  All relevant records and images related to the planned course of therapy were reviewed.  The patient freely provided informed written consent to proceed with treatment after reviewing the details related to the planned course of therapy. The consent form was witnessed and verified by the simulation staff.  Then, the patient was set-up in a stable reproducible  supine position for radiation therapy.  CT images were obtained.  Surface markings were placed.  The CT images were loaded into the planning software.  Then the target and avoidance structures were contoured.  Treatment planning then occurred.  The radiation prescription was entered and confirmed.  Then, I designed and supervised the construction of a total of zero medically necessary complex treatment devices.  I have requested : A simple calculation to midplane.  PLAN:  The patient will receive 30 Gy in 10 fraction starting today with continuation tomorrow.  ________________________________  [name] [name], M.D.

## 2016-09-06 NOTE — Progress Notes (Signed)
Pt left unit to go to radiation. No signs/ symptoms of distress observed. VS WDL. Denies pain or discomfort.

## 2016-09-06 NOTE — Progress Notes (Signed)
[facility]iation [facility] Dept Therapy Treatment Record Phone [phone]   Radiation Therapy was administered to [name] on: 09/06/2016  10:32 AM and was treatment # 1 out of a planned course of 2 treatments.  Radiation Treatment     1). Beam photons with 6-10 energy and Photons 10-19 MeV  2). Brachytherapy None  3). Stereotactic Radiosurgery None  4). Other Radiation None     Xavien Dauphinais, RT (T)

## 2016-09-07 ENCOUNTER — Encounter (HOSPITAL_COMMUNITY): Payer: Self-pay | Admitting: Pulmonary Disease

## 2016-09-07 ENCOUNTER — Inpatient Hospital Stay
Admit: 2016-09-07 | Discharge: 2016-09-07 | Disposition: A | Discharge status: Home / Self Care | Payer: Self-pay | Attending: Radiation Oncology | Admitting: Radiation Oncology

## 2016-09-07 DIAGNOSIS — L8990 Pressure ulcer of unspecified site, unspecified stage: Secondary | ICD-10-CM | POA: Insufficient documentation

## 2016-09-07 LAB — BASIC METABOLIC PANEL
Anion gap: 6 (ref 5–15)
BUN: 9 mg/dL (ref 6–20)
CALCIUM: 7.2 mg/dL — AB (ref 8.9–10.3)
CHLORIDE: 99 mmol/L — AB (ref 101–111)
CO2: 24 mmol/L (ref 22–32)
CREATININE: 0.54 mg/dL — AB (ref 0.61–1.24)
GFR calc non Af Amer: >60 mL/min (ref >60)
GLUCOSE: 119 mg/dL — AB (ref 65–99)
Potassium: 3.5 mmol/L (ref 3.5–5.1)
Sodium: 129 mmol/L — ABNORMAL LOW (ref 135–145)

## 2016-09-07 LAB — CBC
HEMATOCRIT: 27.1 % — AB (ref 39.0–52.0)
HEMOGLOBIN: 9 g/dL — AB (ref 13.0–17.0)
MCH: 28 pg (ref 26.0–34.0)
MCHC: 33.2 g/dL (ref 30.0–36.0)
MCV: 84.4 fL (ref 78.0–100.0)
Platelets: 158 10*3/uL (ref 150–400)
RBC: 3.21 MIL/uL — ABNORMAL LOW (ref 4.22–5.81)
RDW: 17 % — AB (ref 11.5–15.5)
WBC: 15.8 10*3/uL — ABNORMAL HIGH (ref 4.0–10.5)

## 2016-09-07 LAB — PROTIME-INR
INR: 1.3
Prothrombin Time: 16 seconds — ABNORMAL HIGH (ref 11.4–15.2)

## 2016-09-07 MED ORDER — POTASSIUM CHLORIDE CRYS ER 20 MEQ PO TBCR
40.0000 meq | EXTENDED_RELEASE_TABLET | Freq: Two times a day (BID) | ORAL | Status: DC
  Administered 2016-09-07 – 2016-09-10 (×7): 40 meq via ORAL
  Filled 2016-09-07 (×7): qty 2

## 2016-09-07 MED ORDER — NYSTATIN 100000 UNIT/ML MT SUSP
5.0000 mL | Freq: Three times a day (TID) | OROMUCOSAL | Status: DC | PRN
  Administered 2016-09-07: 500000 [IU] via ORAL
  Filled 2016-09-07: qty 5

## 2016-09-07 MED ORDER — TRAZODONE HCL 50 MG PO TABS
50.0000 mg | ORAL_TABLET | Freq: Every day | ORAL | Status: DC
  Administered 2016-09-07 – 2016-09-15 (×9): 50 mg via ORAL
  Filled 2016-09-07 (×9): qty 1

## 2016-09-07 NOTE — Progress Notes (Signed)
Assumed care of patient from previous RN. Agree with previous RN's assessment of patient.  Patient comfortable, stable at this time resting in bed with legs elevated. R BKA CDI.  Will continue to monitor.

## 2016-09-07 NOTE — Progress Notes (Signed)
Progress Note  Patient Name: Edward Summers Date of Encounter: 09/07/2016  Primary Cardiologist: Dr Debara Pickett  Subjective   Doing well today, the patient denies CP or SOB.  No new concerns.  Started radiation yesterday  Inpatient Medications    Scheduled Meds: . amiodarone  200 mg Oral Daily  . atorvastatin  40 mg Oral q1800  . butamben-tetracaine-benzocaine  1 spray Topical Once  . dextromethorphan-guaiFENesin  1 tablet Oral BID  . docusate sodium  100 mg Oral Daily  . enoxaparin (LOVENOX) injection  70 mg Subcutaneous Q12H  . feeding supplement  1 Container Oral BID BM  . feeding supplement (PRO-STAT SUGAR FREE 64)  30 mL Oral BID  . furosemide  40 mg Oral Daily  . lidocaine  1 application Topical Once  . metoprolol tartrate  12.5 mg Oral BID  . mometasone-formoterol  2 puff Inhalation BID  . pantoprazole  40 mg Oral Daily  . predniSONE  10 mg Oral Q breakfast  . sodium chloride flush  3 mL Intravenous Q12H   Continuous Infusions: . sodium chloride    . sodium chloride    . sodium chloride 10 mL/hr at 09/05/16 2238  . lactated ringers 10 mL/hr at 08/21/16 1709  . lactated ringers 10 mL/hr at 08/28/16 1126  . magnesium sulfate 1 - 4 g bolus IVPB     PRN Meds: sodium chloride, sodium chloride, acetaminophen **OR** acetaminophen, acetaminophen, alum & mag hydroxide-simeth, bisacodyl, guaiFENesin-dextromethorphan, hydrALAZINE, hydrALAZINE, ipratropium, labetalol, levalbuterol, magnesium sulfate 1 - 4 g bolus IVPB, ondansetron (ZOFRAN) IV, oxyCODONE-acetaminophen, phenol, phenylephrine, senna-docusate, sodium chloride flush   Vital Signs    Vitals:   09/06/16 1435 09/06/16 2019 09/06/16 2114 09/07/16 0617  BP: (!) 100/55  (!) 104/54 (!) 110/54  Pulse: 62  72 (!) 59  Resp: 17  18 18   Temp: 98.5 F (36.9 C)  98.7 F (37.1 C) 99.2 F (37.3 C)  TempSrc: Oral  Oral Oral  SpO2: 97% 96% 95% 96%  Weight:      Height:        Intake/Output Summary (Last 24 hours) at  09/07/16 0738 Last data filed at 09/07/16 0618  Gross per 24 hour  Intake          1630.83 ml  Output             1600 ml  Net            30.83 ml   Filed Weights   08/21/16 2258 08/28/16 1123 09/05/16 2049  Weight: 156 lb 14.4 oz (71.2 kg) 156 lb (70.8 kg) 142 lb 6.7 oz (64.6 kg)    Telemetry    Sinus rhythm - Personally Reviewed  Physical Exam   GEN- The patient is chronically ill appearing, alert and oriented x 3 today.   Head- normocephalic, atraumatic Eyes-  Sclera clear, conjunctiva pink Ears- hearing intact Oropharynx- clear Neck- supple, Lungs- Clear to ausculation bilaterally, normal work of breathing Heart- Regular rate and rhythm  GI- soft, NT, ND, + BS Extremities- + dependant edema in arms and legs, R BKA Skin- no rash or lesion Psych- euthymic mood, full affect Neuro- strength and sensation are intact   Labs    Chemistry Recent Labs Lab 09/04/16 0221 09/05/16 0229 09/07/16 0429  NA 132* 130* 129*  K 3.4* 3.6 3.5  CL 100* 100* 99*  CO2 26 22 24   GLUCOSE 103* 106* 119*  BUN 8 10 9   CREATININE 0.67 0.59* 0.54*  CALCIUM 7.3*  7.2* 7.2*  GFRNONAA >60 >60 >60  GFRAA >60 >60 >60  ANIONGAP 6 8 6      Hematology Recent Labs Lab 09/04/16 0221 09/05/16 0229 09/07/16 0429  WBC 21.1* 22.9* 15.8*  RBC 3.48* 3.36* 3.21*  HGB 9.9* 9.3* 9.0*  HCT 30.2* 28.9* 27.1*  MCV 86.8 86.0 84.4  MCH 28.4 27.7 28.0  MCHC 32.8 32.2 33.2  RDW 17.4* 17.2* 17.0*  PLT 176 197 158    Cardiac EnzymesNo results for input(s): TROPONINI in the last 168 hours. No results for input(s): TROPIPOC in the last 168 hours.     Patient Profile     Edward Summers is a 76 y.o. male with a past medical history significant for acute limb ischemia.   Also found to have lung CA. Cardiology has been consulted for afib with RVR  Assessment & Plan    1. afib Maintaining sinus rhythm with amiodarone Continue amiodarone x 4 weeks then DC Resume eliquis when able  2. Acute  diastolic dysfunction Continue gentle diuresis as renal function allows  No new CV recs Cardiology to see as needed while here I will arrange outpatient follow-up in the AF clinic.   Thompson Grayer MD, [facility] 09/07/2016 7:38 AM

## 2016-09-07 NOTE — Plan of Care (Signed)
  Problem: Health Behavior/Discharge Planning: Goal: Ability to safely manage health-related needs after discharge will improve Outcome: Progressing   

## 2016-09-07 NOTE — Progress Notes (Signed)
[place] [facility]iation Oncology Dept Therapy Treatment Record Phone [phone]   Radiation Therapy was administered to [name] on: 09/07/2016  10:04 AM and was treatment #2 out of a planned course of 2 treatments.  Radiation Treatment  1). Beam photons with 6-10 energy  2). Brachytherapy None  3). Stereotactic Radiosurgery None  4). Other Radiation None     Khamiyah Grefe, RT (T)

## 2016-09-07 NOTE — Progress Notes (Addendum)
Patient ID: Edward Summers, male   DOB: Apr 30, 1940, 76 y.o.   MRN: [id number]    PROGRESS NOTE   Edward Summers  VOZ:[id number] DOB: [birth year] DOA: 08/21/2016  PCP: Jilda Panda, MD   Brief Narrative:  76yo male former smoker (52 pack years, quit 6 weeks ago) with hx COPD, HTN who presented 8/16 with BLE swelling and painful, cold R foot.  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  Post op had SOB and CT chest to evaluate this revealed multiple lung masses, largest R side with post-obstructive pneumonitis.  PCCM consulted.    SIGNIFICANT EVENTS  08/23 -  R BKA for limb ischemia 08/31 -  bronch for rt lung mass, transferred to [facility] for initiation of radiation therapy  09/01 -  started radiation, [facility] took over as primary attending team   STUDIES:  CT chest 8/30 >>> endobronchial obstruction over the right mainstem with right lower lobe, hilar mass with mediastinal adenopathy and mediastinal invasion.  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 Esophageal obstruction - s/p bronch 8/31, follow up on path report  - started radiation therapy 9/1, today is treatment #2 - continue to provide BD's as needed    New onset atrial fibrillation ([place]) - has been on NSR with amiodarone - per cardiology continue with amiodarone for total 4 weeks and then stop - outpatient cardio follow up will be arranged  - also plan on discharging on Eliquis     Anemia of chronic disease, malignancy - no evidence of active bleeding     Hypokalemia - supplemented but still on low end of normal - will give dose of K-dur to keep K ~ 4    Hyponatremia  - still low, will monitor for now     Acute diastolic CHF - continue diuresis as renal function tolerates - weight trend outline below: [facility] Weights   08/21/16 2258 08/28/16 1123 09/05/16 2049  Weight: 71.2 kg (156 lb 14.4 oz) 70.8 kg  (156 lb) 64.6 kg (142 lb 6.7 oz)  - continue daily weight, strict I/O    Essential hypertension - reasonable control so far    COPD with acute exacerbation (HCC) - allow BD's as needed     Ischemic foot, right, PVD, leukocytosis - 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  Objective: Vitals:   09/06/16 2019 09/06/16 2114 09/07/16 0617 09/07/16 0841  BP:  (!) 104/54 (!) 110/54   Pulse:  72 (!) 59 74  Resp:  18 18   Temp:  98.7 F (37.1 C) 99.2 F (37.3 C)   TempSrc:  Oral Oral   SpO2: 96% 95% 96%   Weight:      Height:        Intake/Output Summary (Last 24 hours) at 09/07/16 1208 Last data filed at 09/07/16 0824  Gross per 24 hour  Intake             1600 ml  Output  1250 ml  Net              350 ml   Filed Weights   08/21/16 2258 08/28/16 1123 09/05/16 2049  Weight: 71.2 kg (156 lb 14.4 oz) 70.8 kg (156 lb) 64.6 kg (142 lb 6.7 oz)    Physical Exam  Constitutional: Appears calm, NAD CVS: RRR, S1/S2 +, no gallops, no carotid bruit.  Pulmonary: bilateral scattered rhonchi, no wheezing  Abdominal: Soft. BS +,  no distension, tenderness, rebound or guarding.  Musculoskeletal: R BKA, LLE and upper extremity edema   Data Reviewed: I have personally reviewed following labs and imaging studies  CBC:  Recent Labs Lab 09/02/16 0345 09/03/16 0840 09/04/16 0221 09/05/16 0229 09/07/16 0429  WBC 22.1* 23.3* 21.1* 22.9* 15.8*  HGB 9.4* 9.9* 9.9* 9.3* 9.0*  HCT 28.5* 28.5* 30.2* 28.9* 27.1*  MCV 85.8 85.6 86.8 86.0 84.4  PLT 172 211 176 197 250   Basic Metabolic Panel:  Recent Labs Lab 09/02/16 0345  09/03/16 0426 09/04/16 0221 09/05/16 0229 09/07/16 0429  NA 130* 130* 132* 130* 129*  K 3.1* 5.0 3.4* 3.6 3.5  CL 98* 100* 100* 100* 99*  CO2 23 25 26 22 24   GLUCOSE 95 94 103* 106* 119*  BUN 7 12 8 10 9   CREATININE 0.59* 0.65 0.67 0.59* 0.54*  CALCIUM 7.2* 7.4* 7.3* 7.2* 7.2*  MG  --  1.9  --   --   --    Coagulation Profile:  Recent Labs Lab 09/05/16 0229 09/07/16 0429  INR 1.22 1.30   Urine analysis:    Component Value Date/Time   COLORURINE YELLOW 09/01/2016 2100   APPEARANCEUR CLEAR 09/01/2016 2100   [place] 1.009 09/01/2016 2100   PHURINE 7.0 09/01/2016 2100   GLUCOSEU NEGATIVE 09/01/2016 2100   [place] NEGATIVE 09/01/2016 2100   [place] NEGATIVE 09/01/2016 2100   [place] NEGATIVE 09/01/2016 2100   PROTEINUR NEGATIVE 09/01/2016 2100   NITRITE NEGATIVE 09/01/2016 2100   LEUKOCYTESUR NEGATIVE 09/01/2016 2100   Radiology Studies: No results found.  Scheduled Meds: . amiodarone  200 mg Oral Daily  . atorvastatin  40 mg Oral q1800  . butamben-tetracaine-benzocaine  1 spray Topical Once  . dextromethorphan-guaiFENesin  1 tablet Oral BID  . docusate sodium  100 mg Oral Daily  . enoxaparin (LOVENOX) injection  70 mg Subcutaneous Q12H  . feeding supplement  1 Container Oral BID BM  . feeding supplement (PRO-STAT SUGAR FREE 64)  30 mL Oral BID  . furosemide  40 mg Oral Daily  . lidocaine  1 application Topical Once  . metoprolol tartrate  12.5 mg Oral BID  . mometasone-formoterol  2 puff Inhalation BID  . pantoprazole  40 mg Oral Daily  . predniSONE  10 mg Oral Q breakfast  . sodium chloride flush  3 mL Intravenous Q12H   Continuous Infusions: . sodium chloride    . sodium chloride    . sodium chloride 10 mL/hr at 09/05/16 2238  . lactated ringers 10 mL/hr at 08/21/16 1709  . lactated ringers 10 mL/hr at 08/28/16 1126  . magnesium sulfate 1 - 4 g bolus IVPB       LOS: 17 days   Time spent: 25 minutes   Faye Ramsay, MD [facility] Pager [phone]  If 7PM-7AM, please contact night-coverage [email or web address] Password [email or web address] 09/07/2016, 12:08 PM

## 2016-09-07 NOTE — Progress Notes (Signed)
ANTICOAGULATION CONSULT NOTE -   Pharmacy Consult for Lovenox Indication: atrial fibrillation  Allergies  Allergen Reactions  . Zithromax [Azithromycin] Other (See Comments)    Side pain    Patient Measurements: Height: 5\' 7"  (170.2 cm) Weight: 142 lb 6.7 oz (64.6 kg) IBW/kg (Calculated) : 66.1  Vital Signs: Temp: 99.2 F (37.3 C) (09/02 0617) Temp Source: Oral (09/02 0617) BP: 110/54 (09/02 0617) Pulse Rate: 59 (09/02 0617)  Labs:  Recent Labs  09/05/16 0229 09/07/16 0429  HGB 9.3* 9.0*  HCT 28.9* 27.1*  PLT 197 158  LABPROT 15.3* 16.0*  INR 1.22 1.30  CREATININE 0.59* 0.54*    Estimated Creatinine Clearance: 74 mL/min (A) (by C-G formula based on SCr of 0.54 mg/dL (L)).  Medical History: Past Medical History:  Diagnosis Date  . Arthritis    "probably; get sore all over" (08/21/2016)  . Dyspnea   . Emphysema of lung ([place])    "from smoking" (08/21/2016)  . History of gout X 1  . HLD (hyperlipidemia)    [name] 08/21/2016  . Hypertension   . Pneumonia 1970s   Medications:  Prescriptions Prior to Admission  Medication Sig Dispense Refill Last Dose  . atorvastatin (LIPITOR) 40 MG tablet Take 40 mg by mouth at bedtime.    Past Week at Unknown time  . lisinopril (PRINIVIL,ZESTRIL) 10 MG tablet Take 10 mg by mouth at bedtime.    Past Week at Unknown time  . predniSONE (DELTASONE) 10 MG tablet Take 10-30 mg by mouth See admin instructions. Take 30 mg by mouth daily for 3 days, take 20 mg by mouth daily for 3 days, then take 10 mg by mouth daily for 3 days.   Past Week at Unknown time    Assessment: 37 yoM transitioning from Eliquis to Lovenox following suspicious finding on this mornings chest CT. Plts 158, Hgb low but stable. Renal function stable CrCl ~75 mL/min  Goal of Therapy:  Anti-Xa level 0.6-1 units/ml 4hrs after LMWH dose given Monitor platelets by anticoagulation protocol: Yes   Plan:  Lovenox 70mg  SQ q12 hours Monitor renal function and s/sx of  bleeding F/u resuming eliquis  Dolly Rias RPh 09/07/2016, 8:19 AM Pager [phone]

## 2016-09-08 ENCOUNTER — Encounter (HOSPITAL_COMMUNITY): Payer: Self-pay

## 2016-09-08 LAB — BASIC METABOLIC PANEL
ANION GAP: 6 (ref 5–15)
BUN: 6 mg/dL (ref 6–20)
CALCIUM: 7.1 mg/dL — AB (ref 8.9–10.3)
CHLORIDE: 101 mmol/L (ref 101–111)
CO2: 22 mmol/L (ref 22–32)
Creatinine, Ser: 0.48 mg/dL — ABNORMAL LOW (ref 0.61–1.24)
GFR calc Af Amer: >60 mL/min (ref >60)
GFR calc non Af Amer: >60 mL/min (ref >60)
GLUCOSE: 105 mg/dL — AB (ref 65–99)
Potassium: 3.9 mmol/L (ref 3.5–5.1)
Sodium: 129 mmol/L — ABNORMAL LOW (ref 135–145)

## 2016-09-08 LAB — CBC
HCT: 27 % — ABNORMAL LOW (ref 39.0–52.0)
Hemoglobin: 9.1 g/dL — ABNORMAL LOW (ref 13.0–17.0)
MCH: 28.9 pg (ref 26.0–34.0)
MCHC: 33.7 g/dL (ref 30.0–36.0)
MCV: 85.7 fL (ref 78.0–100.0)
PLATELETS: 161 10*3/uL (ref 150–400)
RBC: 3.15 MIL/uL — ABNORMAL LOW (ref 4.22–5.81)
RDW: 16.8 % — ABNORMAL HIGH (ref 11.5–15.5)
WBC: 15.4 10*3/uL — ABNORMAL HIGH (ref 4.0–10.5)

## 2016-09-08 LAB — PROTIME-INR
INR: 1.29
PROTHROMBIN TIME: 16 s — AB (ref 11.4–15.2)

## 2016-09-08 NOTE — Progress Notes (Addendum)
Noted pt transferred to [facility] for radiation therapy. I will follow his progress to assist with dispo as needed.I will notify [employer] that pt was not admitted to [facility] Friday or Saturday as planned prior to [place]. They are closed today due to the holiday. I spoke with pt by phone and he feels like he is weaker since last week. He would like to discuss with his significant other as well as his son which type of rehab he would like to pursue ie. CIR vs SNF. I will follow up with him tomorrow.  [phone]

## 2016-09-08 NOTE — Progress Notes (Signed)
Physical Therapy Treatment Patient Details Name: Edward Summers MRN: [id number] DOB: Feb 18, 1940 Today's Date: 09/08/2016    History of Present Illness 76 year old male with history of hypertension, hyperlipidemia, emphysema who was admitted by vascular surgery for acute right leg ischemia and underwent surgical thromboembolectomy of right popliteal, PT and AT arteries. Hospitalist was consulted because patient developed postprocedure atrial fibrillation with rapid ventricular rate and hypotension. Cardiology was also consulted for the same. 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   PMH:  HLD, HTN, COPD    PT Comments    Pt agreeable with encouragement; he reports significant fatigue today; pt is with moderate to severe dyspnea with sitting EOB, requires rest before attempting transfer; continue to recommend CIR if pt is able to tolerate' will continue current POC and make adjustments as needed   Follow Up Recommendations  CIR (vs SNF--depending on activity tol)     Equipment Recommendations  Other (comment) (TBD)    Recommendations for Other Services       Precautions / Restrictions Precautions Precautions: Fall Precaution Comments: R BKA    Mobility  Bed Mobility Overal bed mobility: Needs Assistance Bed Mobility: Supine to Sit     Supine to sit: Supervision     General bed mobility comments: incr time, cues to complete task without physical assist  Transfers Overall transfer level: Needs assistance   Transfers: Lateral/Scoot Transfers          Lateral/Scoot Transfers: Mod assist General transfer comment: pt felt he was too weak to attempt standing on LLE today, he was unable to lift LLE against gravity therefore not attempted with +1 assist; multi-modal cues for scooting techniques/wt shifting and use of UEs; bed pad used to assist with scooting into chair  Ambulation/Gait             General Gait Details: unable at this time   Stairs            Wheelchair  Mobility    Modified Rankin (Stroke Patients Only)       Balance Overall balance assessment: Needs assistance Sitting-balance support: Feet unsupported;No upper extremity supported Sitting balance-Leahy Scale: Fair                                      Cognition Arousal/Alertness: Awake/alert Behavior During Therapy: WFL for tasks assessed/performed Overall Cognitive Status: Within Functional Limits for tasks assessed                                        Exercises General Exercises - Lower Extremity Ankle Circles/Pumps: AROM;AAROM;Left;10 reps Amputee Exercises Quad Sets: Both;10 reps;AROM;Supine Gluteal Sets: AROM;Both;10 reps;Supine Towel Squeeze: AROM;Strengthening;Both;10 reps Hip ABduction/ADduction: AROM;10 reps;Right Knee Flexion: AROM;Right;10 reps;Supine Knee Extension: AROM;Right;10 reps;Seated    General Comments        Pertinent Vitals/Pain Pain Assessment: No/denies pain    Home Living                      Prior Function            PT Goals (current goals can now be found in the care plan section) Acute Rehab PT Goals Patient Stated Goal: return home PT Goal Formulation: With patient Time For Goal Achievement: 09/22/16 Potential to Achieve Goals: Good Progress towards PT goals: Progressing toward goals  Frequency    Min 3X/week      PT Plan Current plan remains appropriate    Co-evaluation              AM-PAC PT "6 Clicks" Daily Activity  Outcome Measure  Difficulty turning over in bed (including adjusting bedclothes, sheets and blankets)?: A Lot Difficulty moving from lying on back to sitting on the side of the bed? : A Little Difficulty sitting down on and standing up from a chair with arms (e.g., wheelchair, bedside commode, etc,.)?: Unable Help needed moving to and from a bed to chair (including a wheelchair)?: A Lot Help needed walking in hospital room?: Total Help needed  climbing 3-5 steps with a railing? : Total 6 Click Score: 10    End of Session Equipment Utilized During Treatment: Gait belt Activity Tolerance: Patient tolerated treatment well Patient left: in chair;with call bell/phone within reach   PT Visit Diagnosis: Unsteadiness on feet (R26.81);Muscle weakness (generalized) (M62.81);Difficulty in walking, not elsewhere classified (R26.2);Pain     Time: 5027-7412 PT Time Calculation (min) (ACUTE ONLY): 20 min  Charges:  $Therapeutic Activity: 8-22 mins                    G CodesKenyon Ana, PT Pager: [phone] 09/08/2016    [facility] 09/08/2016, 3:41 PM

## 2016-09-08 NOTE — Progress Notes (Addendum)
Loprressor held tonight Bp 90/47 and 98/62.  Will continue to monitor.   Sacral ulcers noted to have a moderate amount of tan drainage with odor.  Area cleansed and new Allevyn placed.  Skin education reinforced and  pt allowing [place] to turn him side to side q 2 hours tonight.

## 2016-09-08 NOTE — Progress Notes (Signed)
Report received from J. Edwards,RN. No change in assessment, continue plan of care.Stacey Drain

## 2016-09-08 NOTE — Progress Notes (Signed)
OT Cancellation Note  Patient Details Name: Edward Summers MRN: [id number] DOB: Sep 21, 1940   Cancelled Treatment:    Reason Eval/Treat Not Completed: Other (comment) Pt working with PT. Will check on pt next day. Kari Baars, [place]  Payton Mccallum D 09/08/2016, 2:28 PM

## 2016-09-08 NOTE — Progress Notes (Signed)
Patient ID: Edward Summers, male   DOB: [birth year], 76 y.o.   MRN: [id number]    PROGRESS NOTE   Edward Summers  [id number]:[id number] DOB: 1940/05/27 DOA: 08/21/2016  PCP: Edward Panda, MD   Brief Narrative:  76yo male former smoker (52 pack years, quit 6 weeks ago) with hx COPD, HTN who presented 8/16 with BLE swelling and painful, cold R foot.  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  Post op had SOB and CT chest to evaluate this revealed multiple lung masses, largest R side with post-obstructive pneumonitis.  PCCM consulted.    SIGNIFICANT EVENTS  08/23 -  R BKA for limb ischemia 08/31 -  bronch for rt lung mass, transferred to [facility] for initiation of radiation therapy  09/01 -  started radiation, TRH took over as primary attending team   STUDIES:  CT chest 8/30 >>> endobronchial obstruction over the right mainstem with right lower lobe, hilar mass with mediastinal adenopathy and mediastinal invasion.  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 Esophageal obstruction - s/p bronch 8/31, follow up on path report  - started radiation therapy 9/1, completed two treatments so far - continue to provide BD's as needed    New onset atrial fibrillation ([place]) - has been on NSR with amiodarone - per cardiology continue with amiodarone for total 4 weeks and then stop - outpatient cardio follow up will be arranged  - d/c on Eliquis     Anemia of chronic disease, malignancy - no evidence of active bleeding     Hypokalemia - supplemented and WNL     Hyponatremia  - still at 129, will monitor     Acute diastolic CHF - continue diuresis as renal function tolerates - weight trend outline below: Edward [facility]mers Weights   09/05/16 2049 09/07/16 1300 09/08/16 0558  Weight: 64.6 kg (142 lb 6.7 oz) 64.2 kg (141 lb 8.6 oz) 64 kg (141 lb 1.5 oz)  - monitor daily weights, I/O   Essential hypertension - reasonable control so far    COPD with acute exacerbation (HCC) - allow BD's as needed     Ischemic foot, right, PVD, leukocytosis - 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, management per vascular surgery  - check CBC in AM   DVT prophylaxis: Lovenox SQ Code Status: Full Family Communication: Patient at bedside  Disposition Plan: pending, ? CIR  Consultants:   PCCM  Vascular surgery  Rad oncology   Procedures:   R BKA 8/23  Bronch 8/31  Antimicrobials:   None  Subjective: Pt reports feeling better.   Objective: Vitals:   09/08/16 0558 09/08/16 0631 09/08/16 0858 09/08/16 1032  BP:  (!) 102/51  (!) 90/57  Pulse: 75 70  70  Resp: 18     Temp: 99.5 F (37.5 C)     TempSrc: Oral     SpO2: 98%  92%   Weight: 64 kg (141 lb 1.5 oz)     Height:        Intake/Output Summary (Last 24 hours) at 09/08/16 1221 Last data filed at 09/08/16 1054  Gross per 24 hour  Intake              960 ml  Output             2000 ml  Net            -1040 ml  Filed Weights   09/05/16 2049 09/07/16 1300 09/08/16 0558  Weight: 64.6 kg (142 lb 6.7 oz) 64.2 kg (141 lb 8.6 oz) 64 kg (141 lb 1.5 oz)    Physical Exam  Constitutional: Appears calm, pleasant, NAD CVS: RRR, S1/S2 +, no murmurs, no gallops, no carotid bruit.  Pulmonary: rhonchi at bases  Abdominal: Soft. BS +,  no distension, tenderness, rebound or guarding.  Musculoskeletal: R BKA, LLE edema   Data Reviewed: I have personally reviewed following labs and imaging studies  CBC:  Recent Labs Lab 09/03/16 0840 09/04/16 0221 09/05/16 0229 09/07/16 0429 09/08/16 0532  WBC 23.3* 21.1* 22.9* 15.8* 15.4*  HGB 9.9* 9.9* 9.3* 9.0* 9.1*  HCT 28.5* 30.2* 28.9* 27.1* 27.0*  MCV 85.6 86.8 86.0 84.4 85.7  PLT 211 176 197 158 630   Basic Metabolic Panel:  Recent Labs Lab 09/03/16 0426 09/04/16 0221 09/05/16 0229 09/07/16 0429 09/08/16 0532  NA 130* 132* 130* 129* 129*  K 5.0 3.4* 3.6 3.5 3.9  CL 100* 100*  100* 99* 101  CO2 25 26 22 24 22   GLUCOSE 94 103* 106* 119* 105*  BUN 12 8 10 9 6   CREATININE 0.65 0.67 0.59* 0.54* 0.48*  CALCIUM 7.4* 7.3* 7.2* 7.2* 7.1*  MG 1.9  --   --   --   --    Coagulation Profile:  Recent Labs Lab 09/05/16 0229 09/07/16 0429 09/08/16 0532  INR 1.22 1.30 1.29   Urine analysis:    Component Value Date/Time   COLORURINE YELLOW 09/01/2016 2100   APPEARANCEUR CLEAR 09/01/2016 2100   LABSPEC 1.009 09/01/2016 2100   PHURINE 7.0 09/01/2016 2100   GLUCOSEU NEGATIVE 09/01/2016 2100   HGBUR NEGATIVE 09/01/2016 2100   [place] NEGATIVE 09/01/2016 2100   [place] NEGATIVE 09/01/2016 2100   PROTEINUR NEGATIVE 09/01/2016 2100   NITRITE NEGATIVE 09/01/2016 2100   LEUKOCYTESUR NEGATIVE 09/01/2016 2100   Radiology Studies: No results found.  Scheduled Meds: . amiodarone  200 mg Oral Daily  . atorvastatin  40 mg Oral q1800  . butamben-tetracaine-benzocaine  1 spray Topical Once  . dextromethorphan-guaiFENesin  1 tablet Oral BID  . docusate sodium  100 mg Oral Daily  . enoxaparin (LOVENOX) injection  70 mg Subcutaneous Q12H  . feeding supplement  1 Container Oral BID BM  . feeding supplement (PRO-STAT SUGAR FREE 64)  30 mL Oral BID  . furosemide  40 mg Oral Daily  . lidocaine  1 application Topical Once  . metoprolol tartrate  12.5 mg Oral BID  . mometasone-formoterol  2 puff Inhalation BID  . pantoprazole  40 mg Oral Daily  . potassium chloride  40 mEq Oral BID  . sodium chloride flush  3 mL Intravenous Q12H  . traZODone  50 mg Oral QHS   Continuous Infusions: . sodium chloride    . sodium chloride    . sodium chloride 10 mL/hr at 09/05/16 2238  . lactated ringers 10 mL/hr at 08/21/16 1709  . lactated ringers 10 mL/hr at 08/28/16 1126  . magnesium sulfate 1 - 4 g bolus IVPB       LOS: 18 days   Time spent: 25 minutes   Edward Ramsay, MD [facility] Pager [phone]  If 7PM-7AM, please contact  night-coverage [email or web address] Password [email or web address] 09/08/2016, 12:21 PM

## 2016-09-09 ENCOUNTER — Encounter (HOSPITAL_COMMUNITY): Payer: Self-pay | Admitting: Radiology

## 2016-09-09 ENCOUNTER — Ambulatory Visit
Admit: 2016-09-09 | Discharge: 2016-09-09 | Disposition: A | Discharge status: Home / Self Care | Payer: Medicare HMO | Attending: Radiation Oncology | Admitting: Radiation Oncology

## 2016-09-09 ENCOUNTER — Telehealth: Payer: Self-pay | Admitting: Vascular Surgery

## 2016-09-09 ENCOUNTER — Inpatient Hospital Stay (HOSPITAL_COMMUNITY): Payer: Medicare HMO

## 2016-09-09 DIAGNOSIS — C3431 Malignant neoplasm of lower lobe, right bronchus or lung: Secondary | ICD-10-CM

## 2016-09-09 LAB — BASIC METABOLIC PANEL
ANION GAP: 5 (ref 5–15)
BUN: 9 mg/dL (ref 6–20)
CO2: 23 mmol/L (ref 22–32)
Calcium: 7.2 mg/dL — ABNORMAL LOW (ref 8.9–10.3)
Chloride: 100 mmol/L — ABNORMAL LOW (ref 101–111)
Creatinine, Ser: 0.52 mg/dL — ABNORMAL LOW (ref 0.61–1.24)
GFR calc non Af Amer: >60 mL/min (ref >60)
GLUCOSE: 131 mg/dL — AB (ref 65–99)
POTASSIUM: 4.4 mmol/L (ref 3.5–5.1)
Sodium: 128 mmol/L — ABNORMAL LOW (ref 135–145)

## 2016-09-09 LAB — CBC
HEMATOCRIT: 27.8 % — AB (ref 39.0–52.0)
HEMOGLOBIN: 9.2 g/dL — AB (ref 13.0–17.0)
MCH: 27.9 pg (ref 26.0–34.0)
MCHC: 33.1 g/dL (ref 30.0–36.0)
MCV: 84.2 fL (ref 78.0–100.0)
Platelets: 153 10*3/uL (ref 150–400)
RBC: 3.3 MIL/uL — ABNORMAL LOW (ref 4.22–5.81)
RDW: 16.7 % — ABNORMAL HIGH (ref 11.5–15.5)
WBC: 16.8 10*3/uL — ABNORMAL HIGH (ref 4.0–10.5)

## 2016-09-09 LAB — URINALYSIS, ROUTINE W REFLEX MICROSCOPIC
Bilirubin Urine: NEGATIVE
GLUCOSE, UA: NEGATIVE mg/dL
Hgb urine dipstick: NEGATIVE
Ketones, ur: NEGATIVE mg/dL
LEUKOCYTES UA: NEGATIVE
NITRITE: NEGATIVE
PH: 7 (ref 5.0–8.0)
Protein, ur: NEGATIVE mg/dL
SPECIFIC GRAVITY, URINE: 1.011 (ref 1.005–1.030)

## 2016-09-09 LAB — PROTIME-INR
INR: 1.22
PROTHROMBIN TIME: 15.3 s — AB (ref 11.4–15.2)

## 2016-09-09 MED ORDER — LEVALBUTEROL HCL 1.25 MG/0.5ML IN NEBU
1.2500 mg | INHALATION_SOLUTION | Freq: Three times a day (TID) | RESPIRATORY_TRACT | Status: DC
  Administered 2016-09-09: 1.25 mg via RESPIRATORY_TRACT
  Filled 2016-09-09 (×2): qty 0.5

## 2016-09-09 MED ORDER — APIXABAN 5 MG PO TABS
5.0000 mg | ORAL_TABLET | Freq: Two times a day (BID) | ORAL | Status: DC
  Administered 2016-09-09 – 2016-09-16 (×11): 5 mg via ORAL
  Filled 2016-09-09 (×13): qty 1

## 2016-09-09 MED ORDER — LEVALBUTEROL HCL 1.25 MG/0.5ML IN NEBU
1.2500 mg | INHALATION_SOLUTION | Freq: Three times a day (TID) | RESPIRATORY_TRACT | Status: DC
  Administered 2016-09-10: 1.25 mg via RESPIRATORY_TRACT
  Filled 2016-09-09: qty 0.5

## 2016-09-09 MED ORDER — SODIUM CHLORIDE 0.9 % IV BOLUS (SEPSIS)
500.0000 mL | Freq: Once | INTRAVENOUS | Status: AC
  Administered 2016-09-09: 500 mL via INTRAVENOUS

## 2016-09-09 MED ORDER — LEVALBUTEROL HCL 1.25 MG/0.5ML IN NEBU: 1.2500 mg | INHALATION_SOLUTION | Freq: Four times a day (QID) | RESPIRATORY_TRACT | Status: DC

## 2016-09-09 MED ORDER — APIXABAN 5 MG PO TABS: 5.0000 mg | ORAL_TABLET | Freq: Two times a day (BID) | ORAL | Status: DC

## 2016-09-09 MED ORDER — SODIUM CHLORIDE 0.9 % IV BOLUS (SEPSIS)
250.0000 mL | Freq: Once | INTRAVENOUS | Status: AC
  Administered 2016-09-09: 250 mL via INTRAVENOUS

## 2016-09-09 NOTE — Progress Notes (Addendum)
[place] for Lovenox --> Eliquis Indication: atrial fibrillation  Allergies  Allergen Reactions  . Zithromax [Azithromycin] Other (See Comments)    Side pain    Patient Measurements: Height: 5\' 7"  (170.2 cm) Weight: 140 lb 3.4 oz (63.6 kg) IBW/kg (Calculated) : 66.1  Vital Signs: Temp: 100 F (37.8 C) (09/04 0747) Temp Source: Oral (09/04 0747) BP: 131/63 (09/04 1037) Pulse Rate: 87 (09/04 1037)  Labs:  Recent Labs  09/07/16 0429 09/08/16 0532 09/09/16 0406  HGB 9.0* 9.1* 9.2*  HCT 27.1* 27.0* 27.8*  PLT 158 161 153  LABPROT 16.0* 16.0* 15.3*  INR 1.30 1.29 1.22  CREATININE 0.54* 0.48* 0.52*    Estimated Creatinine Clearance: 72.9 mL/min (A) (by C-G formula based on SCr of 0.52 mg/dL (L)).  Medical History: Past Medical History:  Diagnosis Date  . Arthritis    "probably; get sore all over" (08/21/2016)  . Dyspnea   . Emphysema of lung ([place])    "from smoking" (08/21/2016)  . History of gout X 1  . HLD (hyperlipidemia)    [name] 08/21/2016  . Hypertension   . Pneumonia 1970s   Medications:  Prescriptions Prior to Admission  Medication Sig Dispense Refill Last Dose  . atorvastatin (LIPITOR) 40 MG tablet Take 40 mg by mouth at bedtime.    Past Week at Unknown time  . lisinopril (PRINIVIL,ZESTRIL) 10 MG tablet Take 10 mg by mouth at bedtime.    Past Week at Unknown time  . predniSONE (DELTASONE) 10 MG tablet Take 10-30 mg by mouth See admin instructions. Take 30 mg by mouth daily for 3 days, take 20 mg by mouth daily for 3 days, then take 10 mg by mouth daily for 3 days.   Past Week at Unknown time    Assessment:  72 yoM started on Eliquis for atrial fibrillation.  Was transitioned to Lovenox later this admission for procedures.  Now, consulted to transition patient back to Eliquis.  SCr 0.52, CrCl~72 ml/min Hgb low/stable, platelets WNL  Goal of Therapy:  Prevention of stroke and systemic embolism   Plan:  Resume  apixaban 5 mg PO BID.  Hershal Coria, PharmD, BCPS Pager: [phone] 09/09/2016 12:54 PM

## 2016-09-09 NOTE — Progress Notes (Signed)
Pt will not discharge today related to elevated T100, WBC 16.8. MD to follow up on labs. Pt would like to go to CIR.

## 2016-09-09 NOTE — Progress Notes (Signed)
PT Cancellation Note  Patient Details Name: Edward [facility] MRN: [id number] DOB: 06-24-1940   Cancelled Treatment:    Reason Eval/Treat Not Completed: Patient at procedure or test/unavailable CT then radiation.     Elex Mainwaring,KATHrine E 09/09/2016, 2:49 PM Carmelia Bake, PT, DPT 09/09/2016 Pager: [phone]

## 2016-09-09 NOTE — Telephone Encounter (Signed)
Spoke to spouse/family member for appt change Verified date and time ok fo r9/21, mailed lttr to home

## 2016-09-09 NOTE — Progress Notes (Signed)
Patient ID: Edward [facility]mers, male   DOB: 18-Aug-1940, 76 y.o.   MRN: [id number]    PROGRESS NOTE   ZANIEL MARINEAU  [id number] DOB: [birth year] DOA: 08/21/2016  PCP: Jilda Panda, MD   Brief Narrative:  76yo male former smoker (52 pack years, quit 6 weeks ago) with hx COPD, HTN who presented 8/16 with BLE swelling and painful, cold R foot.  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  Post op had SOB and CT chest to evaluate this revealed multiple lung masses, largest R side with post-obstructive pneumonitis.  PCCM consulted.    SIGNIFICANT EVENTS  08/23 -  R BKA for limb ischemia 08/31 -  bronch for rt lung mass, transferred to [facility] for initiation of radiation therapy  09/01 -  started radiation, TRH took over as primary attending team   STUDIES:  CT chest 8/30 >>> endobronchial obstruction over the right mainstem with right lower lobe, hilar mass with mediastinal adenopathy and mediastinal invasion.  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 Esophageal obstruction, bilateral pleural effusions R > L - s/p bronch 8/31, follow up on path report  - started radiation therapy 9/1, completed two treatments so far, last treatment was 9/2 - continue to provide BD's as needed    Low grade fever overnight 9/3 - 9/4 - with WBC up from 15.4 --> 16.8, will ask for CT chest so that we can compare to last one  - on last CT chest 8/29 there was a notion of bilateral pleural effusions for which pt has been on lasix but there was a note that there was mild loculation on the right side, so will have to make sure this is not getting worse  - there was also post obstructive collapse and pneumonitis so will reevaluate as well  - monitor vital signs and repeat CBC in AM - low threshold of ABX if worsening fevers and symptoms     New  onset atrial fibrillation ([place]) - has been on NSR with amiodarone - per cardiology continue with amiodarone for total 4 weeks and then stop, recommendation was also to reduce dose of Amiodarone to 200 mg on discharge  - outpatient cardio follow up will be arranged  - d/c on Eliquis as well per cardiology     Anemia of chronic disease, malignancy - Hg overall stable     Hypokalemia - supplemented and WNL    Hyponatremia  - na is now down from 129 --> 128 this AM - may be from lasix but not very clear  - will repeat BMP in AM and if Na still down, we may need to hold lasix     Acute diastolic CHF - continue diuresis as renal function tolerates - weight trend outline below: Filed Weights   09/07/16 1300 09/08/16 0558 09/09/16 0600  Weight: 64.2 kg (141 lb 8.6 oz) 64 kg (141 lb 1.5 oz) 63.6 kg (140 lb 3.4 oz)  - has been on lasix 40 mg PO QD - continue to monitor daily weights, strict I/O - cardiology team has signed off and will arrange outpatient follow up for pt, per cardiology notes review     Essential hypertension - reasonable inpatient control for now     COPD with acute exacerbation ([place]) - more wheezing this AM - will continue BD's as  needed and scheduled     Ischemic foot, right, PVD, leukocytosis - 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 CIR  Consultants:   PCCM  Vascular surgery  Rad oncology   Procedures:   R BKA 8/23  Bronch 8/31  Antimicrobials:   None  Subjective: Pt has no concerns this AM, he is still not cure if he wants to be discharged to SNF vs IR.   Objective: Vitals:   09/09/16 0600 09/09/16 0747 09/09/16 1030 09/09/16 1037  BP:  (!) 108/52  131/63    Pulse:  84  87  Resp:  17    Temp:  100 F (37.8 C)    TempSrc:  Oral    SpO2:   96%   Weight: 63.6 kg (140 lb 3.4 oz)     Height:        Intake/Output Summary (Last 24 hours) at 09/09/16 1128 Last data filed at 09/09/16 0914  Gross per 24 hour  Intake             1000 ml  Output             1400 ml  Net             -400 ml   Filed Weights   09/07/16 1300 09/08/16 0558 09/09/16 0600  Weight: 64.2 kg (141 lb 8.6 oz) 64 kg (141 lb 1.5 oz) 63.6 kg (140 lb 3.4 oz)    Physical Exam  Constitutional: Appears calm, NAD CVS: RRR, S1/S2 +, no gallops, no carotid bruit.  Pulmonary: Effort and breath sounds normal, scattered rhonchi with mild exp wheezing  Abdominal: Soft. BS +,  no distension, tenderness, rebound or guarding.  Musculoskeletal: R BKA, LLE pitting edema    Data Reviewed: I have personally reviewed following labs and imaging studies  CBC:  Recent Labs Lab 09/04/16 0221 09/05/16 0229 09/07/16 0429 09/08/16 0532 09/09/16 0406  WBC 21.1* 22.9* 15.8* 15.4* 16.8*  HGB 9.9* 9.3* 9.0* 9.1* 9.2*  HCT 30.2* 28.9* 27.1* 27.0* 27.8*  MCV 86.8 86.0 84.4 85.7 84.2  PLT 176 197 158 161 767   Basic Metabolic Panel:  Recent Labs Lab 09/03/16 0426 09/04/16 0221 09/05/16 0229 09/07/16 0429 09/08/16 0532 09/09/16 0406  NA 130* 132* 130* 129* 129* 128*  K 5.0 3.4* 3.6 3.5 3.9 4.4  CL 100* 100* 100* 99* 101 100*  CO2 25 26 22 24 22 23   GLUCOSE 94 103* 106* 119* 105* 131*  BUN 12 8 10 9 6 9   CREATININE 0.65 0.67 0.59* 0.54* 0.48* 0.52*  CALCIUM 7.4* 7.3* 7.2* 7.2* 7.1* 7.2*  MG 1.9  --   --   --   --   --    Coagulation Profile:  Recent Labs Lab 09/05/16 0229 09/07/16 0429 09/08/16 0532 09/09/16 0406  INR 1.22 1.30 1.29 1.22   Urine analysis:    Component Value Date/Time   COLORURINE YELLOW 09/01/2016 2100   APPEARANCEUR CLEAR 09/01/2016 2100   LABSPEC 1.009 09/01/2016 2100   PHURINE 7.0 09/01/2016 2100   GLUCOSEU NEGATIVE 09/01/2016 2100   HGBUR  NEGATIVE 09/01/2016 2100   BILIRUBINUR NEGATIVE 09/01/2016 2100   [place] NEGATIVE 09/01/2016 2100  PROTEINUR NEGATIVE 09/01/2016 2100   NITRITE NEGATIVE 09/01/2016 2100   LEUKOCYTESUR NEGATIVE 09/01/2016 2100   Radiology Studies: No results found.  Scheduled Meds: . amiodarone  200 mg Oral Daily  . atorvastatin  40 mg Oral q1800  . butamben-tetracaine-benzocaine  1 spray Topical Once  . dextromethorphan-guaiFENesin  1 tablet Oral BID  . docusate sodium  100 mg Oral Daily  . enoxaparin (LOVENOX) injection  70 mg Subcutaneous Q12H  . feeding supplement  1 Container Oral BID BM  . feeding supplement (PRO-STAT SUGAR FREE 64)  30 mL Oral BID  . furosemide  40 mg Oral Daily  . lidocaine  1 application Topical Once  . metoprolol tartrate  12.5 mg Oral BID  . mometasone-formoterol  2 puff Inhalation BID  . pantoprazole  40 mg Oral Daily  . potassium chloride  40 mEq Oral BID  . sodium chloride flush  3 mL Intravenous Q12H  . traZODone  50 mg Oral QHS   Continuous Infusions: . sodium chloride    . sodium chloride    . sodium chloride 10 mL/hr at 09/05/16 2238  . lactated ringers 10 mL/hr at 08/21/16 1709  . lactated ringers 10 mL/hr at 08/28/16 1126  . magnesium sulfate 1 - 4 g bolus IVPB       LOS: 19 days   Time spent: 35 minutes   Faye Ramsay, MD [facility] Pager [phone]  If 7PM-7AM, please contact night-coverage [email or web address] Password [email or web address] 09/09/2016, 11:28 AM

## 2016-09-09 NOTE — Progress Notes (Signed)
Pathology from FOB pending.  PCCM will follow up once resulted.  Please call if new needs arise.   Noe Gens, NP-C [facility] Pgr: [phone] or if no answer [phone] 09/09/2016, 10:26 AM

## 2016-09-09 NOTE — Progress Notes (Signed)
I contacted pt by phone to follow up on his preference for rehab venue. He states his son wants him to finish his radiation and then do his rehab at [facility], but patient states he does not know if he can do it/tolerance. I discussed with SW and will follow pt's progress with therapy to assist in planning dispo. Please clarify when pt would be ready for d/c for that will assist in planning venue options. [phone]

## 2016-09-09 NOTE — Progress Notes (Signed)
Occupational Therapy Treatment Patient Details Name: Edward Summers MRN: [id number] DOB: 1940-08-17 Today's Date: 09/09/2016    History of present illness 76 year old male with history of hypertension, hyperlipidemia, emphysema who was admitted by vascular surgery for acute right leg ischemia and underwent surgical thromboembolectomy of right popliteal, PT and AT arteries. Hospitalist was consulted because patient developed postprocedure atrial fibrillation with rapid ventricular rate and hypotension. Cardiology was also consulted for the same. 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   PMH:  HLD, HTN, COPD   OT comments  Pt did sit EOB for about 10 minutes, perform 1 sit to stand. Upon sitting back pt exhausted and needed to return to supine in which pt completed shaving with electric razor  Follow Up Recommendations  CIR    Equipment Recommendations  3 in 1 bedside commode    Recommendations for Other Services Rehab consult    Precautions / Restrictions Precautions Precautions: Fall Precaution Comments: R BKA Restrictions Weight Bearing Restrictions: Yes RLE Weight Bearing: Non weight bearing Other Position/Activity Restrictions: RLE       Mobility Bed Mobility Overal bed mobility: Needs Assistance Bed Mobility: Supine to Sit     Supine to sit: Supervision     General bed mobility comments: incr time, cues to complete task without physical assist  Transfers Overall transfer level: Needs assistance   Transfers: Sit to/from Stand Sit to Stand: Max assist Stand pivot transfers: Max assist                ADL either performed or assessed with clinical judgement   ADL Overall ADL's : Needs assistance/impaired Eating/Feeding: Set up;Sitting   Grooming: Wash/dry hands;Wash/dry face;Brushing hair;Set up;Sitting                   Toilet Transfer: Minimal assistance;RW;Total assistance Toilet Transfer Details (indicate cue type and reason): used urinal in sitting. Pt not  able to hold urinal in standing with walker,  transitoned to sitting                           Cognition Arousal/Alertness: Awake/alert Behavior During Therapy: WFL for tasks assessed/performed Overall Cognitive Status: Within Functional Limits for tasks assessed                                                General Comments      Pertinent Vitals/ Pain       Faces Pain Scale: Hurts a little bit Pain Location: L LE  Pain Descriptors / Indicators: Discomfort Pain Intervention(s): Limited activity within patient's tolerance;Monitored during session         Frequency  Min 2X/week        Progress Toward Goals  OT Goals(current goals can now be found in the care plan section)  Progress towards OT goals: Progressing toward goals     Plan Discharge plan remains appropriate    Co-evaluation                 AM-PAC PT "6 Clicks" Daily Activity     Outcome Measure   Help from another person eating meals?: None Help from another person taking care of personal grooming?: A Little Help from another person toileting, which includes using toliet, bedpan, or urinal?: A Lot Help from another person bathing (including washing, rinsing, drying)?: A Lot  Help from another person to put on and taking off regular upper body clothing?: A Little Help from another person to put on and taking off regular lower body clothing?: A Lot 6 Click Score: 16    End of Session Equipment Utilized During Treatment: Rolling walker  OT Visit Diagnosis: Unsteadiness on feet (R26.81);Other abnormalities of gait and mobility (R26.89);Pain Pain - Right/Left: Right Pain - part of body: Leg   Activity Tolerance Patient tolerated treatment well   Patient Left in chair;with call bell/phone within reach   Nurse Communication Mobility status        Time: 7944-4619 OT Time Calculation (min): 18 min  Charges: OT General Charges $OT Visit: 1 Visit OT  Treatments $Self Care/Home Management : 8-22 mins  [place], [place]   Payton Mccallum D 09/09/2016, 10:59 AM

## 2016-09-09 NOTE — Progress Notes (Signed)
Met with pt to discuss DC plan. CIR recommended and SNF referrals made as back up. Provided SNF bed offers to pt but pt states at this time he is not interested in SNF and wishes to pursue CIR still if this is appropriate option.  Updated CM and CIR admissions coordinator. Will continue following.   Sharren Bridge, MSW, LCSW Clinical Social Work 09/09/2016 [phone]

## 2016-09-09 NOTE — Telephone Encounter (Signed)
-----   Message from Mena Goes, RN sent at 09/06/2016  7:21 PM EDT ----- Regarding: This is the message.    ----- Message ----- From: Ansel Bong Sent: 08/27/2016  10:27 AM To: [facility] Charge Pool  S/p left radial-cephalic AV fistula 05/26/78  F/u with Dr. Oneida Alar in 4-6 weeks with duplex  Thanks Maudie Mercury

## 2016-09-09 NOTE — Progress Notes (Signed)
  Speech Language Pathology Treatment: Dysphagia  Patient Details Name: Edward Summers MRN: [id number] DOB: 08-Feb-1940 Today's Date: 09/09/2016 Time: 5681-2751 SLP Time Calculation (min) (ACUTE ONLY): 14 min  Assessment / Plan / Recommendation Clinical Impression  Pt describes coughing throughout the day, although minimal baseline coughing was observed prior to initiation of PO trials. Pt consistently has a weak cough that follows trials of thin liquids by straw that is not observed with trials of pureed solids. He does have reports of food "sticking" on the right side as he swallows even these Dys 1 textures, though. Coughing is mitigated with thin liquids when straw is removed. RN also reports minimal intake and coughing during medication administration. Recommend to continue his DYs 3 diet and thin liquids but with no straws and meds given whole in puree. SLP will f/u for tolerance and potential need for instrumental testing.   HPI HPI: Edward Summers a 76 y.o.male wiith history of HTN, HLD admitted with right lower extremity pain and swelling for 2 weeks and increased dspnea. Quit smoking 5 weeks ago. Underwent surgical thromoembolectomy of rith popliteal PT and AT arteries. Patient developed postprocedure atrial fibrillation with rapid ventricular rate and hypotension. Required right below the knee amputaion on 8/23. Rapid responde called for expectorating copious amounts of bloody sputum. Bronchoscopy performed 8/31 revealed large mass from carina to right lower lobe completely occuding and pt transferred to [facility] to initiate radiation 9/1.      SLP Plan  Continue with current plan of care       Recommendations  Diet recommendations: Dysphagia 3 (mechanical soft);Thin liquid Liquids provided via: Cup;No straw Medication Administration: Whole meds with puree Supervision: Patient able to self feed;Intermittent supervision to cue for compensatory strategies Compensations: Slow  rate;Small sips/bites;Follow solids with liquid (rest breaks) Postural Changes and/or Swallow Maneuvers: Seated upright 90 degrees;Upright 30-60 min after meal                Oral Care Recommendations: Oral care BID Follow up Recommendations: Inpatient Rehab SLP Visit Diagnosis: Dysphagia, unspecified (R13.10) Plan: Continue with current plan of care       GO                Edward Summers 09/09/2016, 4:16 PM  Edward Summers, M.A. CCC-SLP [phone]

## 2016-09-09 NOTE — Progress Notes (Signed)
Patient scheduled for simulation at 0900 today. Phoned floor. Spoke with Gregary Signs, RN caring for patient today. She reports the patient is no on oxygen, is a R BKA, and on tele with a sinus rhythm of 79  Explained rad transporters would be up at 0845 to bring patient down for simulation then, back later this afternoon to retrieve him for treatment. Requested patient be taken off tele monitor for simulation. She confirmed understanding. She denies any other issues with the patient or precautions. Informed Aaron Edelman, RT in simulation of these findings. Requested the patient travel via bed. He verbalized understanding.

## 2016-09-10 ENCOUNTER — Ambulatory Visit
Admit: 2016-09-10 | Discharge: 2016-09-10 | Disposition: A | Discharge status: Home / Self Care | Payer: Medicare HMO | Attending: Radiation Oncology | Admitting: Radiation Oncology

## 2016-09-10 ENCOUNTER — Inpatient Hospital Stay (HOSPITAL_COMMUNITY): Payer: Medicare HMO

## 2016-09-10 DIAGNOSIS — R590 Localized enlarged lymph nodes: Secondary | ICD-10-CM

## 2016-09-10 DIAGNOSIS — C3431 Malignant neoplasm of lower lobe, right bronchus or lung: Secondary | ICD-10-CM

## 2016-09-10 DIAGNOSIS — J910 Malignant pleural effusion: Secondary | ICD-10-CM

## 2016-09-10 DIAGNOSIS — R911 Solitary pulmonary nodule: Secondary | ICD-10-CM

## 2016-09-10 LAB — CBC
HCT: 26.9 % — ABNORMAL LOW (ref 39.0–52.0)
HEMOGLOBIN: 8.8 g/dL — AB (ref 13.0–17.0)
MCH: 28.5 pg (ref 26.0–34.0)
MCHC: 32.7 g/dL (ref 30.0–36.0)
MCV: 87.1 fL (ref 78.0–100.0)
Platelets: 138 10*3/uL — ABNORMAL LOW (ref 150–400)
RBC: 3.09 MIL/uL — ABNORMAL LOW (ref 4.22–5.81)
RDW: 16.9 % — AB (ref 11.5–15.5)
WBC: 15.1 10*3/uL — ABNORMAL HIGH (ref 4.0–10.5)

## 2016-09-10 LAB — BASIC METABOLIC PANEL
Anion gap: 6 (ref 5–15)
BUN: 13 mg/dL (ref 6–20)
CALCIUM: 7 mg/dL — AB (ref 8.9–10.3)
CHLORIDE: 103 mmol/L (ref 101–111)
CO2: 20 mmol/L — ABNORMAL LOW (ref 22–32)
CREATININE: 0.53 mg/dL — AB (ref 0.61–1.24)
GFR calc Af Amer: >60 mL/min (ref >60)
GFR calc non Af Amer: >60 mL/min (ref >60)
Glucose, Bld: 105 mg/dL — ABNORMAL HIGH (ref 65–99)
Potassium: 4.5 mmol/L (ref 3.5–5.1)
SODIUM: 129 mmol/L — AB (ref 135–145)

## 2016-09-10 LAB — URINE CULTURE: CULTURE: NO GROWTH

## 2016-09-10 MED ORDER — IOPAMIDOL (ISOVUE-300) INJECTION 61%
INTRAVENOUS | Status: AC
  Filled 2016-09-10: qty 30

## 2016-09-10 MED ORDER — LEVALBUTEROL HCL 1.25 MG/0.5ML IN NEBU
1.2500 mg | INHALATION_SOLUTION | Freq: Two times a day (BID) | RESPIRATORY_TRACT | Status: DC
  Administered 2016-09-11: 1.25 mg via RESPIRATORY_TRACT
  Filled 2016-09-10: qty 0.5

## 2016-09-10 MED ORDER — IOPAMIDOL (ISOVUE-300) INJECTION 61%
INTRAVENOUS | Status: AC
  Filled 2016-09-10: qty 100

## 2016-09-10 MED ORDER — IOPAMIDOL (ISOVUE-300) INJECTION 61%
100.0000 mL | Freq: Once | INTRAVENOUS | Status: AC | PRN
  Administered 2016-09-10: 100 mL via INTRAVENOUS

## 2016-09-10 MED ORDER — IOPAMIDOL (ISOVUE-300) INJECTION 61%: 75.0000 mL | Freq: Once | INTRAVENOUS | Status: DC | PRN

## 2016-09-10 MED ORDER — IOPAMIDOL (ISOVUE-300) INJECTION 61%: 15.0000 mL | Freq: Two times a day (BID) | INTRAVENOUS | Status: DC | PRN

## 2016-09-10 MED ORDER — IOPAMIDOL (ISOVUE-300) INJECTION 61%: 15.0000 mL | Freq: Once | INTRAVENOUS | Status: DC | PRN

## 2016-09-10 MED ORDER — IOPAMIDOL (ISOVUE-300) INJECTION 61%
INTRAVENOUS | Status: AC
  Filled 2016-09-10: qty 75

## 2016-09-10 MED ORDER — SODIUM CHLORIDE 0.9 % IV SOLN
INTRAVENOUS | Status: AC
  Administered 2016-09-10: 01:00:00 via INTRAVENOUS

## 2016-09-10 NOTE — Progress Notes (Signed)
Name: Edward Summers MRN: [id number] DOB: [birth year]    ADMISSION DATE:  08/21/2016 CONSULTATION DATE:  8/30  REFERRING MD :  [facility]  CHIEF COMPLAINT:  Lung mass   BRIEF PATIENT DESCRIPTION: 76yo male former smoker (52 pack years, quit 6 weeks ago) with hx COPD, HTN who presented 8/16 with BLE swelling and painful, cold R foot.  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  Post op had SOB and CT chest to evaluate this revealed multiple lung masses, largest R side with post-obstructive pneumonitis.  PCCM consulted.    SIGNIFICANT EVENTS  8/23>> R BKA for limb ischemia 8/31 > bronch for rt lung mass 9/1 > started radiation  STUDIES:  CT chest 8/30>>> endobronchial obstruction over the right mainstem with right lower lobe, hilar mass with mediastinal adenopathy and mediastinal invasion. CT chest 9/4 > little change from prior study.   HISTORY OF PRESENT ILLNESS:  76yo male former smoker (52 pack years) with hx COPD, HTN who presented 8/16 with BLE swelling and painful, cold R foot.  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  Post op had SOB and CT chest to evaluate this revealed multiple lung masses, largest R side with post-obstructive pneumonitis.  PCCM consulted.    Underwent bronchoscopy on 8/31. Transfer to [facility] for initiation of radiation treatment He underwent his first treatment today without any issues. He is currently on room air resting comfortably.   SUBJECTIVE:  Pathology back - NSCLC.  VITAL SIGNS: Temp:  [97.7 F (36.5 C)-100 F (37.8 C)] 98.2 F (36.8 C) (09/05 0357) Pulse Rate:  [62-87] 67 (09/05 0700) Resp:  [17-26] 18 (09/05 0700) BP: (80-131)/(39-84) 99/84 (09/05 0700) SpO2:  [92 %-98 %] 97 % (09/05 0700) Weight:  [61.3 kg (135 lb 2.3 oz)] 61.3 kg (135 lb 2.3 oz) (09/05 0200)  PHYSICAL EXAMINATION: Gen:      Adult male, no acute distress HEENT:  EOMI, sclera anicteric Neck:      No masses; no thyromegaly Lungs:    Reduced breath sounds on the right, clear on left, normal WOB CV:         Regular rate and rhythm; no murmurs Abd:      + bowel sounds; soft, non-tender; no palpable masses, no distension Ext:    R BKA. Skin:      Warm and dry; no rash Neuro:    A&O x 3 Psych:   Normal mood and affect  Recent Labs Lab 09/08/16 0532 09/09/16 0406 09/10/16 0323  NA 129* 128* 129*  K 3.9 4.4 4.5  CL 101 100* 103  CO2 22 23 20*  BUN 6 9 13   CREATININE 0.48* 0.52* 0.53*  GLUCOSE 105* 131* 105*    Recent Labs Lab 09/08/16 0532 09/09/16 0406 09/10/16 0323  HGB 9.1* 9.2* 8.8*  HCT 27.0* 27.8* 26.9*  WBC 15.4* 16.8* 15.1*  PLT 161 153 138*   Ct Chest Wo Contrast  Result Date: 09/09/2016 CLINICAL DATA:  76 year old male former smoker with history of bilateral lower extremity pain and swelling. Status post right below-the-knee amputation on 08/28/2016 for acute limb ischemia. Multiple lung lesions noted on recent chest CT. Followup study. EXAM: CT CHEST WITHOUT CONTRAST TECHNIQUE: Multidetector CT imaging of the chest was performed following the standard protocol without IV contrast. COMPARISON:  Chest CT 09/03/2016. FINDINGS: Cardiovascular: Heart size is normal. There is no significant pericardial fluid, thickening or pericardial calcification.  There is aortic atherosclerosis, as well as atherosclerosis of the great vessels of the mediastinum and the coronary arteries, including calcified atherosclerotic plaque in the left main, left anterior descending, left circumflex and right coronary arteries. Mediastinum/Nodes: Multiple enlarged mediastinal and hilar lymph nodes better demonstrated on the recent contrast enhanced chest CT. The largest lymph nodes on today's examination include a low right paratracheal node measuring up to 2 cm in short axis and right hilar lymph node measuring up to 1.7 cm in short axis. In addition, there is a large invasive mass centered in the  subcarinal position, which is poorly evaluated on today's noncontrast CT examination but appears grossly similar in size to the prior study at which point this measured 8.5 x 6.6 cm. This mass exerts significant mass effect upon adjacent structures, most notably displacing the left atrium anteriorly and distal half of the esophagus to the left side. There is again invasion of the left mainstem bronchus which appears slightly occluded. No axillary lymphadenopathy. Lungs/Pleura: Near complete collapse/consolidation of the right lower lobe (minimal aerated superior segment right lower lobe). Partial collapse and consolidation of the lateral segment of the right middle lobe. Large infiltrative mass centered in the right hilar region involving the central aspects of the right upper, middle and lower lobes, poorly evaluated on today's noncontrast CT examination, but grossly similar to the recent prior study. Patchy multifocal nodular appearing airspace disease again noted in the lungs bilaterally, with the largest nodule in the right upper lobe near the apex (axial image 31 of series 5) measuring 14 x 11 mm with some central cavitation which is new compared to the prior study. Peripheral area of ground-glass attenuation in the left upper lobe new compared to the prior examination, presumably infectious/inflammatory in etiology. 6 mm left upper lobe pulmonary nodule (axial image 63 of series 5), and 8 mm left lower lobe pulmonary nodule (axial image 105 of series 5) appears similar to the prior study. Moderate right pleural effusion, likely malignant, similar to the prior examination. Trace left pleural effusion also unchanged. Upper Abdomen: Amorphous high attenuation material lying dependently in the gallbladder, compatible with biliary sludge. Exophytic 2.5 cm high attenuation lesion extending from the posterior aspect of the upper pole the left kidney, incompletely characterized on today's noncontrast CT examination,  but likely to represent a proteinaceous/hemorrhagic cysts. Aortic atherosclerosis. Musculoskeletal: There are no aggressive appearing lytic or blastic lesions noted in the visualized portions of the skeleton. IMPRESSION: 1. Overall, there has been very little change in the appearance the chest compared to recent contrast enhanced chest CT 09/04/2014. There continues to be a central mass in the right lung with direct invasion of the mediastinum, including endobronchial invasion obstructing the right mainstem bronchus, as detailed above. Further evaluation with bronchoscopy for biopsy and PET-CT is suggested for diagnostic and staging purposes. 2. Slight interval increase in size of cavitary nodule in the apex of the right upper lobe, presumably infectious related to central obstruction. There also at areas of ground-glass attenuation scattered throughout the left upper lobe which are also likely infectious or inflammatory in etiology. 3. Aortic atherosclerosis, in addition to left main and 3 vessel coronary artery disease. Aortic Atherosclerosis (ICD10-I70.0). Electronically Signed   By: Vinnie Langton M.D.   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 esophageal obstruction. FOB with brushings performed 8/31, pathology has returned positive for NSCLC.  Unfortunately no tissue sampling  was obtained due to brisk bleeding after single brushing performed.    PLAN -  Continue bronchodilator  Continue radiation treatment Needs oncology consult for further recs and to see whether there is any options for chemo etc (no tissue sampling yet and performing biopsies via bronch would certainly be high risk given bleeding during recent bronch with brushings). If oncology does want to pursue biopsies, will need to hold eliquis. Likely needs palliative care consult   Montey Hora, [place] [facility] [place] [facility] Pager:  [phone]  or [phone] 09/10/2016, 8:53 AM

## 2016-09-10 NOTE — Progress Notes (Signed)
  Radiation Oncology         [phone] ________________________________  Name: Edward Summers MRN: [id number]  Date: 09/09/2016  DOB: 29-Sep-1940  INPATIENT  SIMULATION AND TREATMENT PLANNING NOTE    ICD-10-CM   1. 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  Identity was confirmed.  All relevant records and images related to the planned course of therapy were reviewed.  The patient freely provided informed written consent to proceed with treatment after reviewing the details related to the planned course of therapy. The consent form was witnessed and verified by the simulation staff.  Then, the patient was set-up in a stable reproducible  supine position for radiation therapy.  CT images were obtained.  Surface markings were placed.  The CT images were loaded into the planning software.  Then the target and avoidance structures were contoured.  Treatment planning then occurred.  The radiation prescription was entered and confirmed.  Then, I designed and supervised the construction of a total of 4 medically necessary complex treatment devices, including a BodyFix immobilization mold custom fitted to the patient along with 3 multileaf collimators conformally shaped radiation around the treatment target while shielding critical structures such as the heart and spinal cord maximally.  I have requested : 3D Simulation  I have requested a DVH of the following structures: Left lung, right lung, spinal cord, heart, esophagus, and target.  I have ordered:Nutrition Consult  PLAN:  The patient will receive an additional 24 Gy in 8 fractions following his initial emergency 6 Gy in 2 fractions delivered on the treatment machine over the weekend.  ________________________________  [name] [name], M.D.

## 2016-09-10 NOTE — Progress Notes (Signed)
Edward Summers has had some low blood pressures tonight with very little improvement after a 250 cc NS bolus, and then two 500 cc boluses. He has no complaints, seems a little confused, but does not appear to be in distress or toxic.   There is some peripheral edema in his arms and leg, no significant JVD. Skin warm and dry. Heart RRR. Breathing unlabored.   Appreciate rapid response and primary RN's attentiveness. We will transfer to SDU for closer monitoring of borderline BP in this fragile patient.

## 2016-09-10 NOTE — Progress Notes (Signed)
CCM Provider said to hold medication (Apixaben (Eliquis) until Oncology rounds. Possible bronchscopy per CCM.

## 2016-09-10 NOTE — Progress Notes (Signed)
PROGRESS NOTE    Edward Summers  KVQ:[id number] DOB: 12-Apr-1940 DOA: 08/21/2016 PCP: Jilda Panda, MD    Brief Narrative: 76 year old male came in with right foot ischemia status post right BKA. patient had respiratory difficulty postop at a CT scan was done which showed multiple lung masses and postobstructive pneumonitis. Patient had a bronch on 8/3 and the brush biopsy showed non-small cell lung cancer. Overnight patient was moved to the ICU for persistent hypotension was treated with IV fluids. Patient is currently on Lasix 40 mg once a day. Patient was started on Lasix for some pleural effusion which was seen by CT scan or chest x-ray. A repeat CAT scan that was done on 09/09/2016 showed central mass in the right lung with direct invasion to mediastinum, including endobronchial infiltration obstructing the right mainstem bronchus. Interval increase in the size of the cavitary nodule in the apex the right upper lobe presumably infectious.     Assessment & Plan:   Principal Problem:   Ischemic pain of foot, right Active Problems:   New onset atrial fibrillation (HCC)   Atrial fibrillation with RVR (HCC)   HLD (hyperlipidemia)   Essential hypertension   COPD with acute exacerbation (HCC)   Ischemic foot   Acute respiratory distress   Lung mass   Pressure injury of skin  NSCLC discussed with Dr. Annamaria Boots who is on call. Will order a CT scan of the abdomen and pelvis with contrast for staging purposes and for treatment. Patient followed by Dr. [name] for radiation therapy.  Hypotension blood pressure stabilized systolic blood pressure in about 110. DC Lasix. Monitor closely. DC Lopressor due to hypotension.  Leukocytosis follow labs for trending. Did notice a CT of the chest with possible increase in cavitary nodule size. Will hold off on any antibiotics at this time. Low threshold to start antibiotics if he spikes fever and leukocytosis gets worse.wbc trending down.  Hyponatremia  sodium 129. DC Lasix. Multifactorial cause for hyponatremia.  New onset atrial fibrillation on amiodarone taper. On Eliquis 5 mg twice a day.  Ischemic right foot status post BKA 8/23 the stump appears to be healing well and staples are still in place. No evidence of erythema or drainage noted. Should be able to DC the staples in couple of days.  COPD exacerbation continue with nebulizer treatments.  Diastolic CHF Lasix will be this DC today monitor and follow-up closely for any recurring symptoms..    DVT prophylaxis: Patient is on Eliquis  Code Status: Full  Family Communication  Disposition Plan: pending.   Consultants:   onc   Procedures: bronch and BKA Right foot.  Antimicrobials:    Subjective: Breathing better  Objective: Vitals:   09/10/16 0809 09/10/16 0811 09/10/16 0900 09/10/16 1000  BP:   (!) 81/48 (!) 90/55  Pulse:   87 65  Resp:   (!) 24 (!) 21  Temp: 98.1 F (36.7 C)     TempSrc: Oral     SpO2:  97% 93% 97%  Weight:      Height:        Intake/Output Summary (Last 24 hours) at 09/10/16 1121 Last data filed at 09/10/16 1000  Gross per 24 hour  Intake              845 ml  Output              900 ml  Net              -55 ml  Filed Weights   09/08/16 0558 09/09/16 0600 09/10/16 0200  Weight: 64 kg (141 lb 1.5 oz) 63.6 kg (140 lb 3.4 oz) 61.3 kg (135 lb 2.3 oz)    Examination:  General exam: Appears calm and comfortable  Respiratory system: Clear to auscultation. Respiratory effort normal. Cardiovascular system: S1 & S2 heard, RRR. No JVD, murmurs, rubs, gallops or clicks. No pedal edema. Gastrointestinal system: Abdomen is nondistended, soft and nontender. No organomegaly or masses felt. Normal bowel sounds heard. Central nervous system: Alert and oriented. No focal neurological deficits. Extremities right stump bka clean dry and intact staples. Skin: No rashes, lesions or ulcers Psychiatry: Judgement and insight appear normal. Mood & affect  appropriate.     Data Reviewed: I have personally reviewed following labs and imaging studies  CBC:  Recent Labs Lab 09/05/16 0229 09/07/16 0429 09/08/16 0532 09/09/16 0406 09/10/16 0323  WBC 22.9* 15.8* 15.4* 16.8* 15.1*  HGB 9.3* 9.0* 9.1* 9.2* 8.8*  HCT 28.9* 27.1* 27.0* 27.8* 26.9*  MCV 86.0 84.4 85.7 84.2 87.1  PLT 197 158 161 153 829*   Basic Metabolic Panel:  Recent Labs Lab 09/05/16 0229 09/07/16 0429 09/08/16 0532 09/09/16 0406 09/10/16 0323  NA 130* 129* 129* 128* 129*  K 3.6 3.5 3.9 4.4 4.5  CL 100* 99* 101 100* 103  CO2 22 24 22 23  20*  GLUCOSE 106* 119* 105* 131* 105*  BUN 10 9 6 9 13   CREATININE 0.59* 0.54* 0.48* 0.52* 0.53*  CALCIUM 7.2* 7.2* 7.1* 7.2* 7.0*   GFR: Estimated Creatinine Clearance: 70.2 mL/min (A) (by C-G formula based on SCr of 0.53 mg/dL (L)). Liver Function Tests: No results for input(s): AST, ALT, ALKPHOS, BILITOT, PROT, ALBUMIN in the last 168 hours. No results for input(s): LIPASE, AMYLASE in the last 168 hours. No results for input(s): AMMONIA in the last 168 hours. Coagulation Profile:  Recent Labs Lab 09/05/16 0229 09/07/16 0429 09/08/16 0532 09/09/16 0406  INR 1.22 1.30 1.29 1.22   Cardiac Enzymes: No results for input(s): CKTOTAL, CKMB, CKMBINDEX, TROPONINI in the last 168 hours. BNP (last 3 results) No results for input(s): PROBNP in the last 8760 hours. HbA1C: No results for input(s): HGBA1C in the last 72 hours. CBG: No results for input(s): GLUCAP in the last 168 hours. Lipid Profile: No results for input(s): CHOL, HDL, LDLCALC, TRIG, CHOLHDL, LDLDIRECT in the last 72 hours. Thyroid Function Tests: No results for input(s): TSH, T4TOTAL, FREET4, T3FREE, THYROIDAB in the last 72 hours. Anemia Panel: No results for input(s): VITAMINB12, FOLATE, FERRITIN, TIBC, IRON, RETICCTPCT in the last 72 hours. Sepsis Labs: No results for input(s): PROCALCITON, LATICACIDVEN in the last 168 hours.  No results found for  this or any previous visit (from the past 240 hour(s)).       Radiology Studies: Ct Chest Wo Contrast  Result Date: 09/09/2016 CLINICAL DATA:  76 year old male former smoker with history of bilateral lower extremity pain and swelling. Status post right below-the-knee amputation on 08/28/2016 for acute limb ischemia. Multiple lung lesions noted on recent chest CT. Followup study. EXAM: CT CHEST WITHOUT CONTRAST TECHNIQUE: Multidetector CT imaging of the chest was performed following the standard protocol without IV contrast. COMPARISON:  Chest CT 09/03/2016. FINDINGS: Cardiovascular: Heart size is normal. There is no significant pericardial fluid, thickening or pericardial calcification. There is aortic atherosclerosis, as well as atherosclerosis of the great vessels of the mediastinum and the coronary arteries, including calcified atherosclerotic plaque in the left main, left anterior descending, left circumflex and  right coronary arteries. Mediastinum/Nodes: Multiple enlarged mediastinal and hilar lymph nodes better demonstrated on the recent contrast enhanced chest CT. The largest lymph nodes on today's examination include a low right paratracheal node measuring up to 2 cm in short axis and right hilar lymph node measuring up to 1.7 cm in short axis. In addition, there is a large invasive mass centered in the subcarinal position, which is poorly evaluated on today's noncontrast CT examination but appears grossly similar in size to the prior study at which point this measured 8.5 x 6.6 cm. This mass exerts significant mass effect upon adjacent structures, most notably displacing the left atrium anteriorly and distal half of the esophagus to the left side. There is again invasion of the left mainstem bronchus which appears slightly occluded. No axillary lymphadenopathy. Lungs/Pleura: Near complete collapse/consolidation of the right lower lobe (minimal aerated superior segment right lower lobe). Partial  collapse and consolidation of the lateral segment of the right middle lobe. Large infiltrative mass centered in the right hilar region involving the central aspects of the right upper, middle and lower lobes, poorly evaluated on today's noncontrast CT examination, but grossly similar to the recent prior study. Patchy multifocal nodular appearing airspace disease again noted in the lungs bilaterally, with the largest nodule in the right upper lobe near the apex (axial image 31 of series 5) measuring 14 x 11 mm with some central cavitation which is new compared to the prior study. Peripheral area of ground-glass attenuation in the left upper lobe new compared to the prior examination, presumably infectious/inflammatory in etiology. 6 mm left upper lobe pulmonary nodule (axial image 63 of series 5), and 8 mm left lower lobe pulmonary nodule (axial image 105 of series 5) appears similar to the prior study. Moderate right pleural effusion, likely malignant, similar to the prior examination. Trace left pleural effusion also unchanged. Upper Abdomen: Amorphous high attenuation material lying dependently in the gallbladder, compatible with biliary sludge. Exophytic 2.5 cm high attenuation lesion extending from the posterior aspect of the upper pole the left kidney, incompletely characterized on today's noncontrast CT examination, but likely to represent a proteinaceous/hemorrhagic cysts. Aortic atherosclerosis. Musculoskeletal: There are no aggressive appearing lytic or blastic lesions noted in the visualized portions of the skeleton. IMPRESSION: 1. Overall, there has been very little change in the appearance the chest compared to recent contrast enhanced chest CT 09/04/2014. There continues to be a central mass in the right lung with direct invasion of the mediastinum, including endobronchial invasion obstructing the right mainstem bronchus, as detailed above. Further evaluation with bronchoscopy for biopsy and PET-CT is  suggested for diagnostic and staging purposes. 2. Slight interval increase in size of cavitary nodule in the apex of the right upper lobe, presumably infectious related to central obstruction. There also at areas of ground-glass attenuation scattered throughout the left upper lobe which are also likely infectious or inflammatory in etiology. 3. Aortic atherosclerosis, in addition to left main and 3 vessel coronary artery disease. Aortic Atherosclerosis (ICD10-I70.0). Electronically Signed   By: Vinnie Langton M.D.   On: 09/09/2016 14:55        Scheduled Meds: . amiodarone  200 mg Oral Daily  . apixaban  5 mg Oral BID  . atorvastatin  40 mg Oral q1800  . dextromethorphan-guaiFENesin  1 tablet Oral BID  . docusate sodium  100 mg Oral Daily  . feeding supplement  1 Container Oral BID BM  . feeding supplement (PRO-STAT SUGAR FREE 64)  30 mL Oral  BID  . levalbuterol  1.25 mg Nebulization BID  . lidocaine  1 application Topical Once  . metoprolol tartrate  12.5 mg Oral BID  . mometasone-formoterol  2 puff Inhalation BID  . pantoprazole  40 mg Oral Daily  . potassium chloride  40 mEq Oral BID  . sodium chloride flush  3 mL Intravenous Q12H  . traZODone  50 mg Oral QHS   Continuous Infusions: . sodium chloride    . sodium chloride    . sodium chloride 10 mL/hr at 09/10/16 1055  . lactated ringers 10 mL/hr at 08/21/16 1709  . lactated ringers 10 mL/hr at 08/28/16 1126  . magnesium sulfate 1 - 4 g bolus IVPB       LOS: 20 days    Time spent:     Georgette Shell, MD [facility]  If 7PM-7AM, please contact night-coverage [email or web address] Password [email or web address] 09/10/2016, 11:21 AM

## 2016-09-10 NOTE — Progress Notes (Addendum)
Patient BP running as low as 82/56 MD notified and several fluid boluses given. Amongst boluses patient BP still soft at around 90/50s. Patient seems a little more fatigue and withdrawn since yesterday. Rapid response called and MD aware. Due to fluctuating BP patient sent to stepdown in stable condition and report was given to receiving nurse. Patient stated "he did not want to call family to awaken them" about the transition and change of status.

## 2016-09-10 NOTE — Care Management Note (Signed)
Case Management Note  Patient Details  Name: EDD REPPERT MRN: [id number] Date of Birth: June 29, 1940  Subjective/Objective:                  Transferred to sdu pm of [id number] due to hypotension not responsive to iv ns boluses/  Action/Plan: Date:  September 10, 2016 Chart reviewed for concurrent status and case management needs. Will continue to follow patient progress. Discharge Planning: following for needs Expected discharge date: [id number] Velva Harman, BSN, [place], [place]  Expected Discharge Date:  09/05/16               Expected Discharge Plan:  [place]  In-House Referral:  Clinical Social Work  Discharge planning Services  NA, CM Consult  Post Acute Care Choice:  NA Choice offered to:     DME Arranged:    DME Agency:     HH Arranged:    [place] Agency:     Status of Service:  In process, will continue to follow  If discussed at Long Length of Stay Meetings, dates discussed:    Additional Comments:  Leeroy Cha, RN 09/10/2016, 8:18 AM

## 2016-09-10 NOTE — Progress Notes (Addendum)
Nutrition Follow-up  DOCUMENTATION CODES:   Not applicable  INTERVENTION:  - Continue Boost Breeze BID and Prostat BID. - Continue to encourage PO intakes of meals and supplements. - Encourage adequate fluid intakes with meals (recommended taking a sip with each bite of food he takes). - RD will continue to monitor for additional nutrition-related needs.  NUTRITION DIAGNOSIS:   Increased nutrient needs related to catabolic illness, cancer and cancer related treatments as evidenced by estimated needs. -ongiong  GOAL:   Patient will meet greater than or equal to 90% of their needs -unmet  MONITOR:   PO intake, Supplement acceptance, Weight trends, Labs, I & O's  ASSESSMENT:   76 yo Male with history of hypertension, hyperlipidemia, emphysema who was admitted by vascular surgery for acute right leg ischemia and underwent surgical thromboembolectomy of right popliteal, PT and AT arteries. Hospitalist was consulted because patient developed postprocedure atrial fibrillation with rapid ventricular rate and hypotension. Cardiology was also consulted for the same.  9/5 Pt was NPO at time of RD assessment on 8/30; diet changed to Heart Healthy on 8/31 and then to Dysphagia 3, thin liquids with Heart Healthy restriction on 9/1. Per doc flow sheet, he consumed 100% of lunch and dinner 9/1; 100% of breakfast 9/2; 85% of lunch and 40% of dinner 9/3; 100% of breakfast yesterday. Visualized breakfast tray with 100% completion which was [employer] with milk and a cup of coffee. Pt reports that he had a difficult time swallowing as it felt that cereal was stuck in his throat. He states because of this, it took him a very long time to eat breakfast. He has been consuming Boost Breeze and Prostat ~50% of the time offered. He states that since starting radiation (on 9/1 d/t dx of non-small cell lung cancer) he has had dry mouth which leads to increased thirst and decreased ability to swallow well. He  denies pain with swallowing. Also states that appetite has greatly decreased since starting radiation. He states desire to eat has become minimal but that he tries to eat something small at each meal as he knows that he needs to keep up his strength. Encouraged this and also encouraged pt to take a sip of liquid while a bite of food is still in his mouth so that items are more moist, easier to swallow. Unable to have further discussion as pt's son and good friend came to visit and wanted them to have some time with patient. Will continue to assist with symptom management related to nutrition at follow-up.  Per chart review, weight is trending back down and is now -3 lbs/1.3 kg compared to admission weight. Pt was moved from the floor to ICU overnight d/t hypotension and mild confusion. Current BP: 90/55 and MAP: 64. Per PCCM NP note this AM, Oncology to be consulted for recommendations to determine if chemo is an option and states that pt with likely need a Palliative Care consult.   Medications reviewed; 100 mg Colace/day, 2 g IV Mg sulfate PRN, 5 mL Mycostatin TID PRN, 40 mg oral Protonix/day, 40 mEq oral KCl BID. Labs reviewed; Na: 129 mmol/L, creatinine: 0.53 mg/dL, Ca: 7 mg/dL.  ADDENDUM: Per rounds this AM: - pt with esophageal obstruction - Dr. Julien Nordmann to see pt today - reported pt with swallowing to RN (different than report to this RD) - he was able to take pills whole in applesauce although it took a long time    8/30 - Pt admitted with acute R  lower extremity ischemia. - Ischemia became critical and therefore he is s/p BKA 8/23. - Noted Rapid Response Event 8/26.  - Pt coughing up copious amounts of bloody sputum. - PO intake variable at 50-75% per flowsheet records.  - States he's getting Boost Breeze once daily.  - Encouraged him to drink at least 2 per day. - Will order Prostat liquid protein to have him try mixed in a beverage. - CT chest 8/30 unfortunately revealed multiple  lung masses. - Lung biopsy planned for tomorrow.   Diet Order:  DIET DYS 3 Room service appropriate? Yes; Fluid consistency: Thin  Skin:  Reviewed, no issues  Last BM:  9/5  Height:   Ht Readings from Last 1 Encounters:  09/10/16 5\' 7"  (1.702 m)    Weight:   Wt Readings from Last 1 Encounters:  09/10/16 135 lb 2.3 oz (61.3 kg)    Ideal Body Weight:  67.2 kg  BMI:  Body mass index is 21.17 kg/m.  Estimated Nutritional Needs:   Kcal:  1960-2145 (32-35 kcal/kg)  Protein:  90-100 grams  Fluid:  >/= 2 L/day  EDUCATION NEEDS:   No education needs identified at this time    Jarome Matin, MS, RD, LDN, CNSC Inpatient Clinical Dietitian Pager # [phone] After hours/weekend pager # [phone]

## 2016-09-10 NOTE — Progress Notes (Signed)
PT Cancellation Note  Patient Details Name: Edward Summers MRN: [id number] DOB: 1940/08/27   Cancelled Treatment:    Reason Eval/Treat Not Completed: Medical issues which prohibited therapy (transitioned to SDU for low BP. will check back when stable.)   Marcelino Freestone PT [phone]  09/10/2016, 8:03 AM

## 2016-09-10 NOTE — Progress Notes (Addendum)
SLP Cancellation Note  Patient Details Name: Edward Summers MRN: [id number] DOB: 17-Oct-1940   Cancelled treatment:       Reason Eval/Treat Not Completed: Other (comment) (RN reports pt received some difficult news at this time, will continue efforts to follow up)  Note per imaging study, pt upper esophagus fluid level - ? Dysmotility or partial obstruction due to mediastinal mass 09/03/2016.    Janett Labella [place], [place] [facility] SLP [phone]

## 2016-09-10 NOTE — Progress Notes (Addendum)
Pt has had steady decline over the past week and now receiving radiation. His functional status has significantly declined and pt not at a level to be able to tolerate more intense therapies required of an inpt rehab admission at this time. I recommend SNF unless his tolerance to participate with therapies improves. . I have alerted RN CM and SW of my recommendation. I spoke with pt by phone and he is aware of my recommendation for SNF. I will sign off at this time. [phone]

## 2016-09-10 NOTE — Consult Note (Signed)
[place] [phone]   [phone]  CONSULT NOTE  REFERRING PHYSICIAN: Dr. Simonne Maffucci  REASON FOR CONSULTATION:  76 years old white male recently diagnosed with lung cancer.  HPI Edward Summers is a 76 y.o. 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 Scot Dock. On 09/02/2016 the patient presented to the emergency department complaining of shortness of breath and chest x-ray performed at that time showed persistent abnormal opacity in the right lung base with abnormality of the right hilum and infrahilar region as well as a small right pleural effusion. CT scan of the chest with contrast was performed on 09/03/2016 and it showed findings consistent with primary bronchogenic carcinoma with endobronchial obstruction involving the right mainstem bronchus and central right lower lobe and infrahilar soft tissue mass with direct mediastinal invasion and contiguous adenopathy. In continuation this measured 8.5 x 6.6 cm. There was postobstructive pneumonitis throughout the right lower lobe and less so in the right middle lobes. There was also right hilar adenopathy including 0.8 cm nodule. There is mass effect upon the esophagus secondary to the adenopathy. There is also necrotic mediastinal adenopathy with a right paratracheal lymph node measuring 1.9 cm and right jugular/supraclavicular lymph nodes measuring 1.1 cm. The scan also showed left adrenal lesion that was indeterminate. In addition to left-sided pulmonary nodules similar to prior and could be benign in nature but there was new left upper lobe 5 mm nodule questionable for metastasis. The scan also showed right larger than left pleural effusion with mild location involving the right sided pleural effusion. The patient underwent bronchoscopy with brushings  and washing of the right mainstem bronchus under the care of Dr. Lake Bells on 09/05/2016. The final cytology ([id number]) of the right mainstem bronchus percussion showed non-small cell carcinoma. The patient was transferred to [facility] for evaluation and treatment of his condition. He was seen by radiation oncology and expected to start a short course of palliative radiotherapy to the obstructive right lung mass. Dr. Lake Bells kindly asked me to see the patient for evaluation and discussion of his treatment options. When seen today the patient is feeling fine except for fatigue as well as shortness of breath and right-sided chest pain. He denied having any current hemoptysis. He denied having any fever or chills. He has no nausea, vomiting, diarrhea or constipation. CT scan of the abdomen and pelvis performed earlier today showed no findings suggestive of metastatic disease in the abdomen or pelvis.  HPI  Past Medical History:  Diagnosis Date  . Arthritis    "probably; get sore all over" (08/21/2016)  . Dyspnea   . Emphysema of lung ([place])    "from smoking" (08/21/2016)  . History of gout X 1  . HLD (hyperlipidemia)    [name] 08/21/2016  . Hypertension   . Pneumonia 1970s    Past Surgical History:  Procedure Laterality Date  . AMPUTATION Right 08/28/2016   Procedure: AMPUTATION BELOW KNEE RIGHT;  Surgeon: Waynetta Sandy, MD;  Location: [place];  Service: Vascular;  Laterality: Right;  . BACK SURGERY    . CATARACT EXTRACTION W/ INTRAOCULAR LENS IMPLANT Right   . EMBOLECTOMY Right 08/21/2016   Procedure: Right Lower Extremity Thromboembolectomy with Injection of Alteplase into Right Anterior Tibial Artery and Right Anterior Peroneal Artery;  Surgeon: Waynetta Sandy, MD;  Location: [place];  Service:  Vascular;  Laterality: Right;  . LOWER EXTREMITY ANGIOGRAM Right 08/21/2016   [name] 08/21/2016  . LOWER EXTREMITY ANGIOGRAM Right 08/21/2016   Procedure: LOWER EXTREMITY  ANGIOGRAM;  Surgeon: Waynetta Sandy, MD;  Location: [place];  Service: Vascular;  Laterality: Right;  . LOWER EXTREMITY ANGIOGRAPHY N/A 08/25/2016   Procedure: Lower Extremity Angiography;  Surgeon: Angelia Mould, MD;  Location: [place] [facility];  Service: Cardiovascular;  Laterality: N/A;  . LUMBAR DISC SURGERY     Dr. Arnoldo Morale  . THROMBECTOMY Right 08/21/2016   Thromboembolectomy of right popliteal, pt and AT arteries/notes 08/21/2016  . TONSILLECTOMY    . VIDEO BRONCHOSCOPY Bilateral 09/05/2016   Procedure: VIDEO BRONCHOSCOPY WITHOUT FLUORO;  Surgeon: Juanito Doom, MD;  Location: [place];  Service: Cardiopulmonary;  Laterality: Bilateral;    History reviewed. No pertinent family history.  Social History Social History  Substance Use Topics  . Smoking status: Former Smoker    Packs/day: 1.50    Years: 57.00    Types: Cigarettes    Quit date: 07/06/2016  . Smokeless tobacco: Never Used  . Alcohol use Yes     Comment: 08/21/2016 "quit ~ 40 yr ago"    Allergies  Allergen Reactions  . Zithromax [Azithromycin] Other (See Comments)    Side pain    Current Facility-Administered Medications  Medication Dose Route Frequency Provider Last Rate Last Dose  . 0.9 %  sodium chloride infusion  500 mL Intravenous Once PRN Laurence Slate M, PA-C      . 0.9 %  sodium chloride infusion  250 mL Intravenous PRN Angelia Mould, MD      . 0.9 %  sodium chloride infusion   Intravenous Continuous Juanito Doom, MD 10 mL/hr at 09/10/16 1055    . acetaminophen (TYLENOL) tablet 325-650 mg  325-650 mg Oral Q4H PRN Ulyses Amor, PA-C   650 mg at 08/23/16 1508   Or  . acetaminophen (TYLENOL) suppository 325-650 mg  325-650 mg Rectal Q4H PRN Ulyses Amor, PA-C      . acetaminophen (TYLENOL) tablet 650 mg  650 mg Oral Q4H PRN Angelia Mould, MD      . alum & mag hydroxide-simeth (MAALOX/MYLANTA) 200-200-20 MG/5ML suspension 15-30 mL  15-30 mL Oral Q2H PRN  Laurence Slate M, PA-C      . amiodarone (PACERONE) tablet 200 mg  200 mg Oral Daily Lelon Perla, MD   200 mg at 09/10/16 0910  . apixaban (ELIQUIS) tablet 5 mg  5 mg Oral BID Dara Hoyer, RPH   5 mg at 09/09/16 2057  . atorvastatin (LIPITOR) tablet 40 mg  40 mg Oral q1800 Ulyses Amor, PA-C   40 mg at 09/10/16 1721  . bisacodyl (DULCOLAX) EC tablet 5 mg  5 mg Oral Daily PRN Ulyses Amor, PA-C   5 mg at 09/03/16 1109  . dextromethorphan-guaiFENesin (MUCINEX DM) 30-600 MG per 12 hr tablet 1 tablet  1 tablet Oral BID Ivor Costa, MD   1 tablet at 09/10/16 0916  . docusate sodium (COLACE) capsule 100 mg  100 mg Oral Daily Laurence Slate M, PA-C   100 mg at 09/10/16 7341  . feeding supplement (BOOST / RESOURCE BREEZE) liquid 1 Container  1 Container Oral BID BM Waynetta Sandy, MD   1 Container at 09/10/16 1403  . feeding supplement (PRO-STAT SUGAR FREE 64) liquid 30 mL  30 mL Oral BID Waynetta Sandy, MD   30 mL at  09/10/16 0915  . guaiFENesin-dextromethorphan (ROBITUSSIN DM) 100-10 MG/5ML syrup 15 mL  15 mL Oral Q4H PRN Ulyses Amor, PA-C   15 mL at 08/31/16 2304  . iopamidol (ISOVUE-300) 61 % injection           . iopamidol (ISOVUE-300) 61 % injection           . ipratropium (ATROVENT) nebulizer solution 0.5 mg  0.5 mg Nebulization Q4H PRN Waynetta Sandy, MD   0.5 mg at 09/09/16 1923  . labetalol (NORMODYNE,TRANDATE) injection 10 mg  10 mg Intravenous Q10 min PRN Angelia Mould, MD      . lactated ringers infusion   Intravenous Continuous Roderic Palau, MD 10 mL/hr at 08/21/16 1709    . lactated ringers infusion   Intravenous Continuous Duane Boston, MD 10 mL/hr at 08/28/16 1126    . levalbuterol (XOPENEX) nebulizer solution 0.63 mg  0.63 mg Nebulization Q4H PRN Waynetta Sandy, MD   0.63 mg at 09/05/16 2220  . levalbuterol (XOPENEX) nebulizer solution 1.25 mg  1.25 mg Nebulization BID Georgette Shell, MD      . lidocaine  (XYLOCAINE) 2 % jelly 1 application  1 application Topical Once Simonne Maffucci B, MD      . magnesium sulfate IVPB 2 g 50 mL  2 g Intravenous Daily PRN Laurence Slate M, PA-C      . mometasone-formoterol (DULERA) 100-5 MCG/ACT inhaler 2 puff  2 puff Inhalation BID Aline August, MD   2 puff at 09/09/16 1924  . nystatin (MYCOSTATIN) 100000 UNIT/ML suspension 500,000 Units  5 mL Oral TID PRN Theodis Blaze, MD   500,000 Units at 09/07/16 1710  . ondansetron (ZOFRAN) injection 4 mg  4 mg Intravenous Q6H PRN Angelia Mould, MD      . oxyCODONE-acetaminophen (PERCOCET/ROXICET) 5-325 MG per tablet 1-2 tablet  1-2 tablet Oral Q4H PRN Ulyses Amor, PA-C   2 tablet at 09/08/16 0601  . pantoprazole (PROTONIX) EC tablet 40 mg  40 mg Oral Daily Laurence Slate M, PA-C   40 mg at 09/10/16 1191  . phenol (CHLORASEPTIC) mouth spray 1 spray  1 spray Mouth/Throat PRN Laurence Slate M, PA-C      . phenylephrine (NEO-SYNEPHRINE) 0.25 % nasal spray 1 spray  1 spray Each Nare Q6H PRN Juanito Doom, MD      . senna-docusate (Senokot-S) tablet 1 tablet  1 tablet Oral QHS PRN Ulyses Amor, PA-C      . sodium chloride flush (NS) 0.9 % injection 3 mL  3 mL Intravenous Q12H Angelia Mould, MD   3 mL at 09/10/16 1146  . sodium chloride flush (NS) 0.9 % injection 3 mL  3 mL Intravenous PRN Angelia Mould, MD   3 mL at 09/09/16 1054  . traZODone (DESYREL) tablet 50 mg  50 mg Oral QHS Theodis Blaze, MD   50 mg at 09/09/16 2057    Review of Systems  Constitutional: positive for anorexia, fatigue and weight loss Eyes: negative Ears, nose, mouth, throat, and face: negative Respiratory: positive for cough, dyspnea on exertion and wheezing Cardiovascular: negative Gastrointestinal: negative Genitourinary:negative Integument/breast: negative Hematologic/lymphatic: negative Musculoskeletal:positive for arthralgias and muscle weakness Neurological: negative Behavioral/Psych:  negative Endocrine: negative Allergic/Immunologic: negative  Physical Exam  YNW:GNFAO, healthy, no distress, well nourished, well developed and anxious SKIN: skin color, texture, turgor are normal, no rashes or significant lesions HEAD: Normocephalic, No masses, lesions, tenderness or abnormalities EYES: normal, PERRLA, Conjunctiva are  pink and non-injected EARS: External ears normal, Canals clear OROPHARYNX:no exudate, no erythema and lips, buccal mucosa, and tongue normal  NECK: supple, no adenopathy, no JVD LYMPH:  no palpable lymphadenopathy, no hepatosplenomegaly LUNGS: decreased breath sounds, expiratory wheezes bilaterally, scattered rhonchi bilaterally HEART: regular rate & rhythm, no murmurs and no gallops ABDOMEN:abdomen soft, non-tender, normal bowel sounds and no masses or organomegaly BACK: Back symmetric, no curvature., No CVA tenderness EXTREMITIES:no joint deformities, effusion, or inflammation, no edema  NEURO: alert & oriented x 3 with fluent speech, no focal motor/sensory deficits  PERFORMANCE STATUS: ECOG 1-2  LABORATORY DATA: Lab Results  Component Value Date   WBC 15.1 (H) 09/10/2016   HGB 8.8 (L) 09/10/2016   HCT 26.9 (L) 09/10/2016   MCV 87.1 09/10/2016   PLT 138 (L) 09/10/2016    @LASTCHEM @  RADIOGRAPHIC STUDIES: Dg Chest 2 View  Result Date: 08/23/2016 CLINICAL DATA:  Dyspnea. Hx of smoking. Emphysema of lung, HTN, PNA and diabetes. Pt came into hospital for LLE swelling and numbness. EXAM: CHEST  2 VIEW COMPARISON:  08/21/2016 FINDINGS: Cardiac silhouette is mildly enlarged. No mediastinal or hilar masses. Small, right greater than left, pleural effusions with associated lung base opacity, most likely atelectasis. Right lower lobe pneumonia should be considered likely if there are consistent clinical symptoms. Pleural effusions have increased when compared the prior exam. Remainder of the lungs is clear. No pneumothorax. IMPRESSION: 1. Mild increase in  lung base opacity which is likely due to an increase in pleural effusions, right greater than left. 2. Persistent right lung base opacity which may reflect atelectasis, pneumonia or a combination. 3. No evidence of pulmonary edema. Electronically Signed   By: Lajean Manes M.D.   On: 08/23/2016 08:30   Ct Chest Wo Contrast  Result Date: 09/09/2016 CLINICAL DATA:  76 year old male former smoker with history of bilateral lower extremity pain and swelling. Status post right below-the-knee amputation on 08/28/2016 for acute limb ischemia. Multiple lung lesions noted on recent chest CT. Followup study. EXAM: CT CHEST WITHOUT CONTRAST TECHNIQUE: Multidetector CT imaging of the chest was performed following the standard protocol without IV contrast. COMPARISON:  Chest CT 09/03/2016. FINDINGS: Cardiovascular: Heart size is normal. There is no significant pericardial fluid, thickening or pericardial calcification. There is aortic atherosclerosis, as well as atherosclerosis of the great vessels of the mediastinum and the coronary arteries, including calcified atherosclerotic plaque in the left main, left anterior descending, left circumflex and right coronary arteries. Mediastinum/Nodes: Multiple enlarged mediastinal and hilar lymph nodes better demonstrated on the recent contrast enhanced chest CT. The largest lymph nodes on today's examination include a low right paratracheal node measuring up to 2 cm in short axis and right hilar lymph node measuring up to 1.7 cm in short axis. In addition, there is a large invasive mass centered in the subcarinal position, which is poorly evaluated on today's noncontrast CT examination but appears grossly similar in size to the prior study at which point this measured 8.5 x 6.6 cm. This mass exerts significant mass effect upon adjacent structures, most notably displacing the left atrium anteriorly and distal half of the esophagus to the left side. There is again invasion of the left  mainstem bronchus which appears slightly occluded. No axillary lymphadenopathy. Lungs/Pleura: Near complete collapse/consolidation of the right lower lobe (minimal aerated superior segment right lower lobe). Partial collapse and consolidation of the lateral segment of the right middle lobe. Large infiltrative mass centered in the right hilar region involving the central aspects  of the right upper, middle and lower lobes, poorly evaluated on today's noncontrast CT examination, but grossly similar to the recent prior study. Patchy multifocal nodular appearing airspace disease again noted in the lungs bilaterally, with the largest nodule in the right upper lobe near the apex (axial image 31 of series 5) measuring 14 x 11 mm with some central cavitation which is new compared to the prior study. Peripheral area of ground-glass attenuation in the left upper lobe new compared to the prior examination, presumably infectious/inflammatory in etiology. 6 mm left upper lobe pulmonary nodule (axial image 63 of series 5), and 8 mm left lower lobe pulmonary nodule (axial image 105 of series 5) appears similar to the prior study. Moderate right pleural effusion, likely malignant, similar to the prior examination. Trace left pleural effusion also unchanged. Upper Abdomen: Amorphous high attenuation material lying dependently in the gallbladder, compatible with biliary sludge. Exophytic 2.5 cm high attenuation lesion extending from the posterior aspect of the upper pole the left kidney, incompletely characterized on today's noncontrast CT examination, but likely to represent a proteinaceous/hemorrhagic cysts. Aortic atherosclerosis. Musculoskeletal: There are no aggressive appearing lytic or blastic lesions noted in the visualized portions of the skeleton. IMPRESSION: 1. Overall, there has been very little change in the appearance the chest compared to recent contrast enhanced chest CT 09/04/2014. There continues to be a central mass  in the right lung with direct invasion of the mediastinum, including endobronchial invasion obstructing the right mainstem bronchus, as detailed above. Further evaluation with bronchoscopy for biopsy and PET-CT is suggested for diagnostic and staging purposes. 2. Slight interval increase in size of cavitary nodule in the apex of the right upper lobe, presumably infectious related to central obstruction. There also at areas of ground-glass attenuation scattered throughout the left upper lobe which are also likely infectious or inflammatory in etiology. 3. Aortic atherosclerosis, in addition to left main and 3 vessel coronary artery disease. Aortic Atherosclerosis (ICD10-I70.0). Electronically Signed   By: Vinnie Langton M.D.   On: 09/09/2016 14:55   Ct Chest W Contrast  Result Date: 09/03/2016 CLINICAL DATA:  Followup of lung nodule. Abnormal chest radiograph, demonstrating possible right hilar soft tissue fullness. EXAM: CT CHEST WITH CONTRAST TECHNIQUE: Multidetector CT imaging of the chest was performed during intravenous contrast administration. CONTRAST:  94mL ISOVUE-300 IOPAMIDOL (ISOVUE-300) INJECTION 61% COMPARISON:  Multiple chest radiographs, most recent 08/25/2016. Chest CT 11/24/2005. FINDINGS: Cardiovascular: Aortic and branch vessel atherosclerosis. Tortuous thoracic aorta. Mild cardiomegaly, without pericardial effusion. Multivessel coronary artery atherosclerosis. No central pulmonary embolism, on this non-dedicated study. Mediastinum/Nodes: Low right jugular/ supraclavicular adenopathy. Example at 11 mm on image 9/ series 3. Necrotic mediastinal adenopathy, with a right paratracheal node measuring 1.9 cm on image 27/series 3. Right hilar adenopathy including at 2.8 cm. Mass-effect upon the esophagus, secondary to adenopathy. There is a fluid level in the upper esophagus. Lungs/Pleura: Small right and trace left-sided pleural effusions. Mild loculation of right-sided effusion superiorly.  Endobronchial obstruction involving the right mainstem bronchus including on image 59/series 4. Central right lower lobe and infrahilar soft tissue mass which is most consistent with primary bronchogenic carcinoma, direct mediastinal invasion, and contiguous adenopathy. In combination, this measures on the order of 8.5 x 6.6 cm on image 78/series 3. Postobstructive pneumonitis throughout the right lower lobe and less so right middle lobes. Right upper lobe patchy airspace and ground-glass opacity is likely related to postobstructive pneumonitis. Left-sided pulmonary nodules, including 8 mm nodule in the left lower lobe on image  91/series 4, similar. A left upper lobe 5 mm nodule on image 50/series 4 is new. Smaller left upper lobe nodules are present on the remote prior. Upper Abdomen: Normal imaged portions of the liver, spleen, stomach, pancreas, gallbladder, right adrenal gland, and right kidney. Left adrenal nodularity is unchanged. An interpolar exophytic left renal lesion measures 2.4 cm and greater than fluid density. 2.1 cm back on 11/24/2005. Probable sebaceous cyst about the anterior left chest wall. Musculoskeletal: Moderate thoracic spondylosis. IMPRESSION: 1. 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 Direct mediastinal invasion, nodal metastasis within the chest/low neck, and endobronchial obstruction. 2. Postobstructive collapse and pneumonitis, as detailed above. 3. Right larger than left pleural effusions with mild loculation involving the right-sided pleural effusion. 4. Upper esophageal fluid level could represent dysmotility or partial obstruction secondary to the mediastinal mass. 5. Coronary artery atherosclerosis. Aortic Atherosclerosis (ICD10-I70.0). 6. Left renal lesion is technically indeterminate but likely a complex cyst, given presence back in 2007. 7. Left-sided pulmonary nodules, primarily similar and therefore benign. A left upper lobe 5 mm nodule is new and could  represent a metastasis. 8. The patient may be predisposed to SVC syndrome. Electronically Signed   By: Abigail Miyamoto M.D.   On: 09/03/2016 16:18   Dg Chest Port 1 View  Result Date: 09/02/2016 CLINICAL DATA:  Shortness of breath EXAM: PORTABLE CHEST 1 VIEW COMPARISON:  Chest x-ray of 08/31/2016 and 07/04/2016 FINDINGS: The chest x-ray of 07/04/2016 shows a clear right lung base. However the persistence of right basilar volume loss in abnormality the right hilum is worrisome for possible central endobronchial process. Also there may be a small right pleural effusion present. CT of the chest with IV contrast media is recommended to assess further. The left lung is clear. Mild cardiomegaly is stable. No bony abnormality is seen. IMPRESSION: 1. Persistent abnormal opacity at the right lung base with abnormality of the right hilum and infrahilar region. Cannot exclude a central endobronchial process. Recommend CT of the chest with IV contrast media. 2. Small right pleural effusion. Electronically Signed   By: Ivar Drape M.D.   On: 09/02/2016 09:02   Dg Chest Port 1 View  Result Date: 08/31/2016 CLINICAL DATA:  Acute respiratory distress.  Productive cough. EXAM: PORTABLE CHEST 1 VIEW COMPARISON:  Radiograph 08/23/2016, additional priors FINDINGS: Progressive volume loss in the right lower lobe with increased right basilar opacity. Improved left basilar aeration. Unchanged heart size and mediastinal contours. No pulmonary edema. No pneumothorax. IMPRESSION: Progressive volume loss in the right lower lobe with increased basilar opacity. This may be due to increased pleural effusion or atelectasis. Recommend continued radiographic follow-up to resolution. Improving left basilar aeration. Electronically Signed   By: Jeb Levering M.D.   On: 08/31/2016 23:07   Dg Chest Portable 1 View  Result Date: 08/21/2016 CLINICAL DATA:  Shortness of breath, bilateral lower extremity edema and discoloration. EXAM: PORTABLE  CHEST 1 VIEW COMPARISON:  PA and lateral chest x-ray of July 04, 2016 FINDINGS: The left lung is well-expanded and clear. On the right there is increased density at the lung base. The heart is mildly enlarged. The pulmonary vascularity is normal. There is no pleural effusion. There is calcification in the wall of the aortic arch. The bony thorax exhibits no acute abnormality. IMPRESSION: Right lower lobe atelectasis or pneumonia. Probable underlying COPD or reactive airway disease. Followup PA and lateral chest X-ray is recommended in 3-4 weeks following trial of antibiotic therapy to ensure resolution and exclude underlying malignancy.  No evidence of pulmonary edema or pleural effusions. Electronically Signed   By: David  Martinique M.D.   On: 08/21/2016 15:33   Dg Foot 2 Views Left  Result Date: 08/21/2016 Please see detailed radiograph report in office note.  Dg Foot 2 Views Right  Result Date: 08/21/2016 Please see detailed radiograph report in office note.   ASSESSMENT: This is a very pleasant 76 years old white male recently diagnosed with locally advanced/metastatic non-small cell lung cancer presented with large obstructive central right lower lobe lung mass in addition to mediastinal invasion as well as mediastinal and hilar lymphadenopathy and suspicious malignant right pleural effusion and contralateral left upper lobe lung nodule diagnosed in August 2018.  PLAN: I had a lengthy discussion with the patient today about his current disease stage, prognosis and treatment options. I explained to the patient that he has incurable condition and also treatment will be off palliative nature. I recommended for the patient to complete the staging workup by ordering a MRI of the brain to rule out brain metastasis. I would also consider the patient for ultrasound guided right thoracentesis for drainage of the right pleural effusion and also to have more tissue for confirmation of the subtype of the  non-small cell lung cancer as well as molecular studies. I will also consider the patient for a PET scan on outpatient basis after discharge. I recommended for the patient to continue the course of palliative radiotherapy as planned by Dr. [name]. I will arrange for him a follow-up visit at the [facility] with me after discharge for more detailed discussion of his treatment options based on the molecular studies and PDL 1. He will continue his other home medication for now. The patient was advised to call immediately if he has any concerning symptoms. The patient voices understanding of current disease status and treatment options and is in agreement with the current care plan. All questions were answered. The patient knows to call the clinic with any problems, questions or concerns. We can certainly see the patient much sooner if necessary.  Thank you so much for allowing me to participate in the care of Edward Summers. I will continue to follow up the patient with you and assist in his care. Disclaimer: This note was dictated with voice recognition software. Similar sounding words can inadvertently be transcribed and may not be corrected upon review.   Eilleen Kempf September 10, 2016, 6:16 PM

## 2016-09-11 ENCOUNTER — Inpatient Hospital Stay (HOSPITAL_COMMUNITY): Payer: Medicare HMO

## 2016-09-11 ENCOUNTER — Ambulatory Visit
Admit: 2016-09-11 | Discharge: 2016-09-11 | Disposition: A | Discharge status: Home / Self Care | Payer: Medicare HMO | Attending: Radiation Oncology | Admitting: Radiation Oncology

## 2016-09-11 LAB — BASIC METABOLIC PANEL
Anion gap: 5 (ref 5–15)
BUN: 12 mg/dL (ref 6–20)
CALCIUM: 7.5 mg/dL — AB (ref 8.9–10.3)
CHLORIDE: 104 mmol/L (ref 101–111)
CO2: 22 mmol/L (ref 22–32)
CREATININE: 0.55 mg/dL — AB (ref 0.61–1.24)
GFR calc Af Amer: >60 mL/min (ref >60)
GFR calc non Af Amer: >60 mL/min (ref >60)
GLUCOSE: 110 mg/dL — AB (ref 65–99)
Potassium: 4.1 mmol/L (ref 3.5–5.1)
Sodium: 131 mmol/L — ABNORMAL LOW (ref 135–145)

## 2016-09-11 LAB — CBC WITH DIFFERENTIAL/PLATELET
BASOS PCT: 0 %
Basophils Absolute: 0 10*3/uL (ref 0.0–0.1)
EOS ABS: 0 10*3/uL (ref 0.0–0.7)
Eosinophils Relative: 0 %
HCT: 27.5 % — ABNORMAL LOW (ref 39.0–52.0)
Hemoglobin: 9.2 g/dL — ABNORMAL LOW (ref 13.0–17.0)
LYMPHS ABS: 1 10*3/uL (ref 0.7–4.0)
Lymphocytes Relative: 8 %
MCH: 28.8 pg (ref 26.0–34.0)
MCHC: 33.5 g/dL (ref 30.0–36.0)
MCV: 86.2 fL (ref 78.0–100.0)
MONO ABS: 1 10*3/uL (ref 0.1–1.0)
MONOS PCT: 8 %
Neutro Abs: 10.2 10*3/uL — ABNORMAL HIGH (ref 1.7–7.7)
Neutrophils Relative %: 84 %
Platelets: 138 10*3/uL — ABNORMAL LOW (ref 150–400)
RBC: 3.19 MIL/uL — ABNORMAL LOW (ref 4.22–5.81)
RDW: 16.7 % — AB (ref 11.5–15.5)
WBC: 12.2 10*3/uL — ABNORMAL HIGH (ref 4.0–10.5)

## 2016-09-11 MED ORDER — LORAZEPAM BOLUS VIA INFUSION: 0.5000 mg | INTRAVENOUS | Status: DC | PRN

## 2016-09-11 MED ORDER — LORAZEPAM 2 MG/ML IJ SOLN
0.5000 mg | INTRAMUSCULAR | Status: DC | PRN
  Administered 2016-09-12: 0.5 mg via INTRAVENOUS
  Filled 2016-09-11: qty 1

## 2016-09-11 MED ORDER — HYDROCODONE-HOMATROPINE 5-1.5 MG/5ML PO SYRP
5.0000 mL | ORAL_SOLUTION | ORAL | Status: DC | PRN
  Administered 2016-09-11 – 2016-09-14 (×4): 5 mL via ORAL
  Filled 2016-09-11 (×4): qty 5

## 2016-09-11 MED ORDER — LIDOCAINE HCL 2 % IJ SOLN
INTRAMUSCULAR | Status: AC
  Filled 2016-09-11: qty 10

## 2016-09-11 NOTE — Care Management Important Message (Signed)
Important Message  Patient Details  Name: OSAZE HUBBERT MRN: [id number] Date of Birth: Feb 26, 1940   Medicare Important Message Given:  Yes    Kerin Salen 09/11/2016, 11:09 AMImportant Message  Patient Details  Name: HERMILO DUTTER MRN: [id number] Date of Birth: January 02, 1941   Medicare Important Message Given:  Yes    Kerin Salen 09/11/2016, 11:09 AM

## 2016-09-11 NOTE — Progress Notes (Signed)
Patient came down for his thoracentesis.  His site was marked, but patient began having a coughing fit.  He has now coughed without ceasing for over 20 minutes.  He has had several episodes of post-tussive emesis secondary to this coughing.  Unfortunately, we will not be able to do his procedure as he is unable to stop coughing.  He will return upstairs and we can reattempt tomorrow.  Sage Hammill E 3:26 PM 09/11/2016

## 2016-09-11 NOTE — Progress Notes (Signed)
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  Stated he received "some bad news" and that he feels "wore out".  Pt did agree to LE TE's.  Assisted and instructed on proper tech of B LE's TE's listed below.  Pt educated on proper positioning of R LE (knee extension) to decrease risk for knee flex contracture. Pt instructed on certain R LE TE's to perform to maintain strength and increase circulation.  Handout also given on Post Op amputation care.  Discussed the process of healing and stump shrinkage before a prosthetic fitting (if applicable).  Will attempt to see again for transfers and possible gait.   Follow Up Recommendations  SNF (per chart review, pt unable to qualify for CIR.  Now plans are for SNF)     Equipment Recommendations       Recommendations for Other Services       Precautions / Restrictions Precautions Precautions: Fall Precaution Comments: R BKA Restrictions Weight Bearing Restrictions: Yes RLE Weight Bearing: Non weight bearing Other Position/Activity Restrictions: RLE    Mobility  Bed Mobility               General bed mobility comments: pt declined anu OOB attempt (recliner/toilet) repositioned to comfort                      Balance                                            Cognition Arousal/Alertness: Awake/alert Behavior During Therapy: WFL for tasks assessed/performed Overall Cognitive Status: Within Functional Limits for tasks assessed                                 General Comments: received some bad news yesterday.  "a lot going on"       Exercises General Exercises - Lower Extremity Ankle Circles/Pumps: AROM;20 reps;Supine;Left Quad Sets: AROM;Right;Left;10 reps;Supine Gluteal Sets: AROM;Right;Left;10  reps;Supine Short Arc Quad: AAROM;Right;Left;10 reps;Supine Hip ABduction/ADduction: AROM;Right;Left;10 reps;Supine Straight Leg Raises: AAROM;Right;10 reps;Supine    General Comments        Pertinent Vitals/Pain Pain Assessment: No/denies pain    Home Living                      Prior Function            PT Goals (current goals can now be found in the care plan section) Progress towards PT goals: Progressing toward goals    Frequency    Min 3X/week      PT Plan Current plan remains appropriate    Co-evaluation              AM-PAC PT "6 Clicks" Daily Activity  Outcome Measure  Difficulty turning over in bed (including adjusting bedclothes, sheets and blankets)?: A Lot Difficulty moving from lying on back to sitting on the side of the bed? : A Lot Difficulty sitting down on and standing up from a chair with arms (e.g., wheelchair, bedside commode, etc,.)?: A Lot Help needed moving to and from a bed to chair (including a wheelchair)?:  Total Help needed walking in hospital room?: Total Help needed climbing 3-5 steps with a railing? : Total 6 Click Score: 9    End of Session Equipment Utilized During Treatment: Gait belt Activity Tolerance: Patient tolerated treatment well Patient left: in bed;with call bell/phone within reach   PT Visit Diagnosis: Unsteadiness on feet (R26.81);Muscle weakness (generalized) (M62.81);Difficulty in walking, not elsewhere classified (R26.2);Pain     Time: 5573-2202 PT Time Calculation (min) (ACUTE ONLY): 25 min  Charges:  $Therapeutic Exercise: 8-22 mins $Therapeutic Activity: 8-22 mins                    G Codes:       Rica Koyanagi  PTA [facility]  Acute  Rehab Pager      [phone]

## 2016-09-11 NOTE — Progress Notes (Signed)
PROGRESS NOTE    Edward Summers  VHQ:[id number] DOB: 1940-01-13 DOA: 08/21/2016 PCP: Jilda Panda, MD   Brief Narrative: 76 yo with new diagnosis of NSCLC.patient came in for right lower lomb ischemia.had bka.awaiting onc consult.ct abdomen and pelvis with iv contrast essentially unremarkable.    Assessment & Plan:   Principal Problem:   Ischemic pain of foot, right Active Problems:   New onset atrial fibrillation (HCC)   Atrial fibrillation with RVR (HCC)   HLD (hyperlipidemia)   Essential hypertension   COPD with acute exacerbation (HCC)   Ischemic foot   Acute respiratory distress   Lung mass   Pressure injury of skin  NSCLC await onc recs.?repeat bronch..if no further treatment planned possible dc to snf and fu with onc and xrt.for ultrasounded guided thoracentesis.  Right BKA Stable.dc staples soon.  Hyponatremia improved off lasix.  Leukocytosis improved.  afib continue amio.    DVT prophylaxisoac Code Status:  Family Communication Disposition Plan:    Consultants:    Procedures:   Antimicrobials:    Subjective: Feels down because of the new diagnosis  Objective: Vitals:   09/10/16 1526 09/10/16 2011 09/11/16 0525 09/11/16 0806  BP: 108/62 (!) 103/55 107/67   Pulse: 66 68 72   Resp: 20 20 18    Temp: 99.4 F (37.4 C) 98.9 F (37.2 C) 98.4 F (36.9 C)   TempSrc: Oral Oral Oral   SpO2: 100% 99% 97% 98%  Weight:    63.2 kg (139 lb 5.3 oz)  Height:        Intake/Output Summary (Last 24 hours) at 09/11/16 1428 Last data filed at 09/11/16 1043  Gross per 24 hour  Intake              120 ml  Output              700 ml  Net             -580 ml   Filed Weights   09/09/16 0600 09/10/16 0200 09/11/16 0806  Weight: 63.6 kg (140 lb 3.4 oz) 61.3 kg (135 lb 2.3 oz) 63.2 kg (139 lb 5.3 oz)    Examination:  General exam: Appears calm and comfortable  Respiratory system: Clear to auscultation. Respiratory effort normal. Cardiovascular  system: S1 & S2 heard, RRR. No JVD, murmurs, rubs, gallops or clicks. No pedal edema. Gastrointestinal system: Abdomen is nondistended, soft and nontender. No organomegaly or masses felt. Normal bowel sounds heard. Central nervous system: Alert and oriented. No focal neurological deficits. Extremities: Symmetric 5 x 5 power. Skin: No rashes, lesions or ulcers Psychiatry: Judgement and insight appear normal. Mood & affect appropriate.     Data Reviewed: I have personally reviewed following labs and imaging studies  CBC:  Recent Labs Lab 09/07/16 0429 09/08/16 0532 09/09/16 0406 09/10/16 0323 09/11/16 0324  WBC 15.8* 15.4* 16.8* 15.1* 12.2*  NEUTROABS  --   --   --   --  10.2*  HGB 9.0* 9.1* 9.2* 8.8* 9.2*  HCT 27.1* 27.0* 27.8* 26.9* 27.5*  MCV 84.4 85.7 84.2 87.1 86.2  PLT 158 161 153 138* 413*   Basic Metabolic Panel:  Recent Labs Lab 09/07/16 0429 09/08/16 0532 09/09/16 0406 09/10/16 0323 09/11/16 0324  NA 129* 129* 128* 129* 131*  K 3.5 3.9 4.4 4.5 4.1  CL 99* 101 100* 103 104  CO2 24 22 23  20* 22  GLUCOSE 119* 105* 131* 105* 110*  BUN 9 6 9 13 12   CREATININE  0.54* 0.48* 0.52* 0.53* 0.55*  CALCIUM 7.2* 7.1* 7.2* 7.0* 7.5*   GFR: Estimated Creatinine Clearance: 72.4 mL/min (A) (by C-G formula based on SCr of 0.55 mg/dL (L)). Liver Function Tests: No results for input(s): AST, ALT, ALKPHOS, BILITOT, PROT, ALBUMIN in the last 168 hours. No results for input(s): LIPASE, AMYLASE in the last 168 hours. No results for input(s): AMMONIA in the last 168 hours. Coagulation Profile:  Recent Labs Lab 09/05/16 0229 09/07/16 0429 09/08/16 0532 09/09/16 0406  INR 1.22 1.30 1.29 1.22   Cardiac Enzymes: No results for input(s): CKTOTAL, CKMB, CKMBINDEX, TROPONINI in the last 168 hours. BNP (last 3 results) No results for input(s): PROBNP in the last 8760 hours. HbA1C: No results for input(s): HGBA1C in the last 72 hours. CBG: No results for input(s): GLUCAP in  the last 168 hours. Lipid Profile: No results for input(s): CHOL, HDL, LDLCALC, TRIG, CHOLHDL, LDLDIRECT in the last 72 hours. Thyroid Function Tests: No results for input(s): TSH, T4TOTAL, FREET4, T3FREE, THYROIDAB in the last 72 hours. Anemia Panel: No results for input(s): VITAMINB12, FOLATE, FERRITIN, TIBC, IRON, RETICCTPCT in the last 72 hours. Sepsis Labs: No results for input(s): PROCALCITON, LATICACIDVEN in the last 168 hours.  Recent Results (from the past 240 hour(s))  Culture, Urine     Status: None   Collection Time: 09/09/16  3:44 PM  Result Value Ref Range Status   Specimen Description URINE, CLEAN CATCH  Final   Special Requests NONE  Final   Culture   Final    NO GROWTH Performed at [facility], [address], [place], [place] [address]    Report Status 09/10/2016 FINAL  Final         Radiology Studies: Ct Abdomen Pelvis W Contrast  Result Date: 09/10/2016 CLINICAL DATA:  76 year old male with history of non-small cell carcinoma. Abdominal pain and fever. Evaluate for potential abscess. EXAM: CT ABDOMEN AND PELVIS WITH CONTRAST TECHNIQUE: Multidetector CT imaging of the abdomen and pelvis was performed using the standard protocol following bolus administration of intravenous contrast. CONTRAST:  141mL ISOVUE-300 IOPAMIDOL (ISOVUE-300) INJECTION 61% COMPARISON:  CT the abdomen and pelvis 12/25/2008. FINDINGS: Lower chest: Similar appearance to recent chest CT demonstrating a hypovascular mass in the lower mediastinum, complete atelectasis/consolidation of visualized portions of the right lower lobe, probable central perihilar mass and partial subsegmental atelectasis in the right middle lobe with moderate right pleural effusion. Trace left pleural effusion. Hepatobiliary: No cystic or solid hepatic lesions. No intra or extrahepatic biliary ductal dilatation. Amorphous high attenuation material in the dependent portion of the gallbladder, compatible with biliary  sludge and/or vicarious excretion of contrast. No signs of acute cholecystitis are noted at this time. Pancreas: No pancreatic mass. No pancreatic ductal dilatation. No pancreatic or peripancreatic fluid or inflammatory changes. Spleen: Unremarkable. Adrenals/Urinary Tract: 1.2 cm left adrenal nodule is indeterminate. Right adrenal gland is normal in appearance. Exophytic 2.7 cm high attenuation lesion in the upper pole of the left kidney, only slightly larger than remote prior study from 2010, likely a proteinaceous/hemorrhagic cyst. subcentimeter low-attenuation lesion in the lower pole the right kidney is too small to definitively characterize, but is likely to represent tiny cyst. Stomach/Bowel: The appearance of the stomach is normal. There is no pathologic dilatation of small bowel or colon. There is some subjective wall thickening in the ascending colon, best appreciated on axial images 38-48 of series 2, which could be overestimated secondary to under distention of the colon in this region. Normal  appendix. Vascular/Lymphatic: Aortic atherosclerosis, without evidence of aneurysm or dissection in the abdominal or pelvic vasculature. No lymphadenopathy noted in the abdomen or pelvis. Reproductive: Prostate gland and seminal vesicles are unremarkable in appearance. Other: No significant volume of ascites.  No pneumoperitoneum. Musculoskeletal: There are no aggressive appearing lytic or blastic lesions noted in the visualized portions of the skeleton. IMPRESSION: 1. No abscess identified in the abdomen or pelvis. 2. Subjective wall thickening in the region of the ascending colon concerning for a colitis. 3. Biliary sludge or vicarious excretion of contrast material in the gallbladder. No findings to suggest an acute cholecystitis at this time. 4. 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 5. Aortic atherosclerosis. 6. Additional incidental findings, as  above. Aortic Atherosclerosis (ICD10-I70.0). Electronically Signed   By: Vinnie Langton M.D.   On: 09/10/2016 19:13        Scheduled Meds: . amiodarone  200 mg Oral Daily  . apixaban  5 mg Oral BID  . atorvastatin  40 mg Oral q1800  . dextromethorphan-guaiFENesin  1 tablet Oral BID  . docusate sodium  100 mg Oral Daily  . feeding supplement  1 Container Oral BID BM  . feeding supplement (PRO-STAT SUGAR FREE 64)  30 mL Oral BID  . levalbuterol  1.25 mg Nebulization BID  . lidocaine  1 application Topical Once  . mometasone-formoterol  2 puff Inhalation BID  . pantoprazole  40 mg Oral Daily  . sodium chloride flush  3 mL Intravenous Q12H  . traZODone  50 mg Oral QHS   Continuous Infusions: . sodium chloride    . sodium chloride    . sodium chloride 10 mL/hr at 09/10/16 1055  . lactated ringers 10 mL/hr at 08/21/16 1709  . lactated ringers 10 mL/hr at 08/28/16 1126  . magnesium sulfate 1 - 4 g bolus IVPB       LOS: 21 days     Georgette Shell, MD [facility]   If 7PM-7AM, please contact night-coverage [email or web address] Password [email or web address] 09/11/2016, 2:28 PM

## 2016-09-11 NOTE — Progress Notes (Signed)
CSW following for assistance with DC plan. Pt had preferred to go to CIR at [facility] for rehab however admissions coordinator reports pt has been screened and currently inappropriate for CIR. Pt has SNF bed offers but is not willing to consider SNF at this time. CSW inquired as to supports available at home and pt would not engage in discussion. CSW requested to call pt's significant other (with whom he lives) and his son to plan and request information, and pt declined to have CSW contact them. States, "I will talk with them tonight and let you know what I decide." CSW emphasized need for planning due to insurance pre authorization requirements and other considerations but pt maintained he is not ready to discuss.   Will continue following and encouraging pt to consider DC plan.  Sharren Bridge, MSW, LCSW Clinical Social Work 09/11/2016 [phone]

## 2016-09-12 ENCOUNTER — Ambulatory Visit
Admit: 2016-09-12 | Discharge: 2016-09-12 | Disposition: A | Discharge status: Home / Self Care | Payer: Medicare HMO | Attending: Radiation Oncology | Admitting: Radiation Oncology

## 2016-09-12 ENCOUNTER — Inpatient Hospital Stay (HOSPITAL_COMMUNITY): Payer: Medicare HMO

## 2016-09-12 LAB — CBC
HCT: 25.7 % — ABNORMAL LOW (ref 39.0–52.0)
HEMOGLOBIN: 8.8 g/dL — AB (ref 13.0–17.0)
MCH: 29.4 pg (ref 26.0–34.0)
MCHC: 34.2 g/dL (ref 30.0–36.0)
MCV: 86 fL (ref 78.0–100.0)
PLATELETS: 131 10*3/uL — AB (ref 150–400)
RBC: 2.99 MIL/uL — AB (ref 4.22–5.81)
RDW: 16.6 % — ABNORMAL HIGH (ref 11.5–15.5)
WBC: 9.4 10*3/uL (ref 4.0–10.5)

## 2016-09-12 MED ORDER — GADOBENATE DIMEGLUMINE 529 MG/ML IV SOLN
15.0000 mL | Freq: Once | INTRAVENOUS | Status: AC | PRN
  Administered 2016-09-12: 13 mL via INTRAVENOUS

## 2016-09-12 MED ORDER — LIDOCAINE HCL 2 % IJ SOLN
INTRAMUSCULAR | Status: AC
  Filled 2016-09-12: qty 10

## 2016-09-12 NOTE — Progress Notes (Signed)
PROGRESS NOTE    Edward Summers  ZOX:[id number] DOB: [birth year] DOA: 08/21/2016 PCP: Jilda Panda, MD    Brief Narrative: 76 year old male admitted with right lower extremity ischemia status post right below-knee amputation. Post operative period was complicated with hypoxia and respiratory failure. So a CT chest was done for this reason and it showed mass in the right mainstem stem bronchus which was biopsied and came back positive for non-small cell lung cancer. Patient is getting radiation at this time. Patient is seen by oncology and plan to follow up with him as an outpatient. Patient is waiting to have an MRI done MRI of the brain done today to see if there is any evidence of metastases. He also had a thoracentesis from the right side and was sent for cytology.    Assessment & Plan:   Principal Problem:   Ischemic pain of foot, right Active Problems:   New onset atrial fibrillation (HCC)   Atrial fibrillation with RVR (HCC)   HLD (hyperlipidemia)   Essential hypertension   COPD with acute exacerbation (HCC)   Ischemic foot   Acute respiratory distress   Lung mass   Pressure injury of skin   Ischemic right foot status post right BKA  Non-small cell lung cancer new diagnosis follow-up with oncology continue oxygen. Continue with XRT. Reviewed Dr. Worthy Flank note. Appreciate consultation.  New onset atrial fibrillation continue current medications along with xarelto.  Disposition patient waiting for a skilled nursing facility bed which will be available on Monday social worker and case manager aware.   DVT prophylaxis: Code Status:  Family Communication:  Disposition plan  Consultants:   Procedures  Antimicrobials:    Subjective: Does not feel good  Objective: Vitals:   09/12/16 0742 09/12/16 0900 09/12/16 1203 09/12/16 1622  BP:   91/61 (!) 97/49  Pulse:    91  Resp:    15  Temp:    97.7 F (36.5 C)  TempSrc:    Oral  SpO2: 95%   99%  Weight:  65.3 kg  (143 lb 15.4 oz)    Height:        Intake/Output Summary (Last 24 hours) at 09/12/16 1713 Last data filed at 09/12/16 1300  Gross per 24 hour  Intake              120 ml  Output              450 ml  Net             -330 ml   Filed Weights   09/10/16 0200 09/11/16 0806 09/12/16 0900  Weight: 61.3 kg (135 lb 2.3 oz) 63.2 kg (139 lb 5.3 oz) 65.3 kg (143 lb 15.4 oz)    Examination:  General exam: Appears calm and comfortable  Respiratory system: Clear to auscultation. Respiratory effort normal. Cardiovascular system: S1 & S2 heard, RRR. No JVD, murmurs, rubs, gallops or clicks. No pedal edema. Gastrointestinal system: Abdomen is nondistended, soft and nontender. No organomegaly or masses felt. Normal bowel sounds heard. Central nervous system: Alert and oriented. No focal neurological deficits. Extremities: Symmetric 5 x 5 power. Skin: No rashes, lesions or ulcers Psychiatry: Judgement and insight appear normal. Mood & affect appropriate.     Data Reviewed: I have personally reviewed following labs and imaging studies  CBC:  Recent Labs Lab 09/08/16 0532 09/09/16 0406 09/10/16 0323 09/11/16 0324 09/12/16 0344  WBC 15.4* 16.8* 15.1* 12.2* 9.4  NEUTROABS  --   --   --  10.2*  --   HGB 9.1* 9.2* 8.8* 9.2* 8.8*  HCT 27.0* 27.8* 26.9* 27.5* 25.7*  MCV 85.7 84.2 87.1 86.2 86.0  PLT 161 153 138* 138* 027*   Basic Metabolic Panel:  Recent Labs Lab 09/07/16 0429 09/08/16 0532 09/09/16 0406 09/10/16 0323 09/11/16 0324  NA 129* 129* 128* 129* 131*  K 3.5 3.9 4.4 4.5 4.1  CL 99* 101 100* 103 104  CO2 24 22 23  20* 22  GLUCOSE 119* 105* 131* 105* 110*  BUN 9 6 9 13 12   CREATININE 0.54* 0.48* 0.52* 0.53* 0.55*  CALCIUM 7.2* 7.1* 7.2* 7.0* 7.5*   GFR: Estimated Creatinine Clearance: 74.8 mL/min (A) (by C-G formula based on SCr of 0.55 mg/dL (L)). Liver Function Tests: No results for input(s): AST, ALT, ALKPHOS, BILITOT, PROT, ALBUMIN in the last 168 hours. No results  for input(s): LIPASE, AMYLASE in the last 168 hours. No results for input(s): AMMONIA in the last 168 hours. Coagulation Profile:  Recent Labs Lab 09/07/16 0429 09/08/16 0532 09/09/16 0406  INR 1.30 1.29 1.22   Cardiac Enzymes: No results for input(s): CKTOTAL, CKMB, CKMBINDEX, TROPONINI in the last 168 hours. BNP (last 3 results) No results for input(s): PROBNP in the last 8760 hours. HbA1C: No results for input(s): HGBA1C in the last 72 hours. CBG: No results for input(s): GLUCAP in the last 168 hours. Lipid Profile: No results for input(s): CHOL, HDL, LDLCALC, TRIG, CHOLHDL, LDLDIRECT in the last 72 hours. Thyroid Function Tests: No results for input(s): TSH, T4TOTAL, FREET4, T3FREE, THYROIDAB in the last 72 hours. Anemia Panel: No results for input(s): VITAMINB12, FOLATE, FERRITIN, TIBC, IRON, RETICCTPCT in the last 72 hours. Sepsis Labs: No results for input(s): PROCALCITON, LATICACIDVEN in the last 168 hours.  Recent Results (from the past 240 hour(s))  Culture, Urine     Status: None   Collection Time: 09/09/16  3:44 PM  Result Value Ref Range Status   Specimen Description URINE, CLEAN CATCH  Final   Special Requests NONE  Final   Culture   Final    NO GROWTH Performed at [facility], [address], [place],  [address]    Report Status 09/10/2016 FINAL  Final         Radiology Studies: Dg Chest 1 View  Result Date: 09/12/2016 CLINICAL DATA:  Post right thoracentesis EXAM: CHEST 1 VIEW COMPARISON:  09/02/2016 FINDINGS: Small right pleural effusion. No pneumothorax. Cardiomegaly. Right hilar mass again noted. No focal opacity on the left or effusion. IMPRESSION: Small right pleural effusion with stable right hilar mass. No pneumothorax. Electronically Signed   By: Rolm Baptise M.D.   On: 09/12/2016 12:21   [name] NO Contrast  Result Date: 09/12/2016 CLINICAL DATA:  76 year old male with recently discovered right lung mass with mediastinal  invasion. Staging. EXAM: MRI HEAD WITHOUT AND WITH CONTRAST TECHNIQUE: Multiplanar, multiecho pulse sequences of the brain and surrounding structures were obtained without and with intravenous contrast. CONTRAST:  42mL MULTIHANCE GADOBENATE DIMEGLUMINE 529 MG/ML IV SOLN COMPARISON:  No prior brain imaging. FINDINGS: Brain: Only axial postcontrast imaging could be obtained. There is a round 4 mm enhancing lesion in the left inferior temporal gyrus with mild surrounding edema. No significant mass effect. See series 12, image 18. No other No abnormal enhancement identified.  No dural thickening. No restricted diffusion to suggest acute infarction. No midline shift, mass effect, ventriculomegaly, extra-axial collection or acute intracranial hemorrhage. Cervicomedullary junction and pituitary are within normal limits. Scattered  nonenhancing nonspecific bilateral cerebral white matter T2 and FLAIR hyperintensity. Patchy T2 hyperintensity in the pons. No cortical encephalomalacia or chronic cerebral blood products identified. The deep gray matter nuclei and cerebellum appear normal. Vascular: Major intracranial vascular flow voids are preserved, the distal left vertebral artery is dominant. Skull and upper cervical spine: Visualized bone marrow signal is within normal limits. Negative visualized cervical spine. Sinuses/Orbits: Postoperative changes to the right globe, otherwise normal orbits soft tissues. Paranasal sinuses are well pneumatized. Other: Mild bilateral mastoid effusions. Visible internal auditory structures appear normal. Negative scalp soft tissues. IMPRESSION: 1. 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 Mild surrounding edema but no mass effect. 2. No other acute intracranial abnormality. Electronically Signed   By: Genevie Ann M.D.   On: 09/12/2016 15:59   US Thoracentesis Asp Pleural Space W/img Guide  Result Date:  09/12/2016 INDICATION: Patient with history of metastatic lung cancer, dyspnea, right pleural effusion; request made for diagnostic and therapeutic right thoracentesis. EXAM: ULTRASOUND GUIDED DIAGNOSTIC AND THERAPEUTIC RIGHT THORACENTESIS MEDICATIONS: None. COMPLICATIONS: None immediate. PROCEDURE: An ultrasound guided thoracentesis was thoroughly discussed with the patient and questions answered. The benefits, risks, alternatives and complications were also discussed. The patient understands and wishes to proceed with the procedure. Written consent was obtained. Ultrasound was performed to localize and mark an adequate pocket of fluid in the right chest. The area was then prepped and draped in the normal sterile fashion. 1% Lidocaine was used for local anesthesia. Under ultrasound guidance a Safe-T-Centesis catheter was introduced. Thoracentesis was performed. The catheter was removed and a dressing applied. FINDINGS: A total of approximately 1 liter of slightly hazy, yellow fluid was removed. Samples were sent to the laboratory as requested by the clinical team. IMPRESSION: Successful ultrasound guided diagnostic and therapeutic right thoracentesis yielding 1 liter of pleural fluid. Follow-up chest film shows no pneumothorax. Read by: Rowe Coolidge, PA-C Electronically Signed   By: Lucrezia Europe M.D.   On: 09/12/2016 13:43        Scheduled Meds: . amiodarone  200 mg Oral Daily  . apixaban  5 mg Oral BID  . atorvastatin  40 mg Oral q1800  . dextromethorphan-guaiFENesin  1 tablet Oral BID  . docusate sodium  100 mg Oral Daily  . feeding supplement  1 Container Oral BID BM  . feeding supplement (PRO-STAT SUGAR FREE 64)  30 mL Oral BID  . lidocaine      . mometasone-formoterol  2 puff Inhalation BID  . pantoprazole  40 mg Oral Daily  . sodium chloride flush  3 mL Intravenous Q12H  . traZODone  50 mg Oral QHS   Continuous Infusions: . sodium chloride    . sodium chloride    . sodium chloride 10  mL/hr at 09/10/16 1055  . lactated ringers 10 mL/hr at 08/21/16 1709  . lactated ringers 10 mL/hr at 08/28/16 1126  . magnesium sulfate 1 - 4 g bolus IVPB       LOS: 22 days        Georgette Shell, MD [facility]  If 7PM-7AM, please contact night-coverage [email or web address] Password [email or web address] 09/12/2016, 5:13 PM

## 2016-09-12 NOTE — Progress Notes (Signed)
Occupational Therapy Treatment Patient Details Name: Edward Summers MRN: [id number] DOB: [birth year] Today's Date: 09/12/2016    History of present illness R BKA 08/29/16 new Dx Lung CA   OT comments  Pt making slow progress towards goals, declines EOB/OOB activity this session but agreeable to bed level ADLs and bil UE therapeutic exercise. Pt completed grooming ADLs with setup, bil UE ther ex with min verbal cues and rest breaks throughout. Per chart review Pt unable to qualify for CIR, d/c recs have been updated to reflect. Will continue to follow acutely and recommend SNF OT services after discharge to maximize Pt's safety and independence with ADLs and functional mobility.    Follow Up Recommendations  SNF;Other (comment) (per chart reivew, Pt unable to qualify for CIR, recommend SNF)    Equipment Recommendations  3 in 1 bedside commode          Precautions / Restrictions Precautions Precautions: Fall Precaution Comments: R BKA Restrictions Weight Bearing Restrictions: Yes RLE Weight Bearing: Non weight bearing Other Position/Activity Restrictions: RLE       Mobility Bed Mobility Overal bed mobility: Needs Assistance Bed Mobility: Rolling Rolling: Min assist         General bed mobility comments: assist to roll to L for repositioning/comfort; declined EOB/OOB activity this session                                                                    ADL either performed or assessed with clinical judgement   ADL Overall ADL's : Needs assistance/impaired Eating/Feeding: Set up;Sitting   Grooming: Set up;Bed level;Wash/dry face                                 General ADL Comments: Pt declined EOB/OOB activity this session; agreeable to bed level ADLs and bil UE ther ex.                        Cognition Arousal/Alertness: Awake/alert Behavior During Therapy: WFL for tasks assessed/performed Overall Cognitive  Status: Within Functional Limits for tasks assessed                                          Exercises General Exercises - Upper Extremity Shoulder Flexion: AROM;10 reps;Left;Right;Both;Other (comment);Supine (HOB elevated; alternating R, L and then both R and L together ) Shoulder Extension: AROM;10 reps;Right;Left;Both;Supine Elbow Flexion: AROM;10 reps;Both;Supine (10 reps x2) Elbow Extension: AROM;10 reps;Both;Supine (10 reps x2) Other Exercises Other Exercises: forward/backward rows 5 reps x2                 Pertinent Vitals/ Pain       Pain Assessment: No/denies pain                                                          Frequency  Min 2X/week        Progress  Toward Goals  OT Goals(current goals can now be found in the care plan section)  Progress towards OT goals: Progressing toward goals (slowly )  Acute Rehab OT Goals Patient Stated Goal: return home OT Goal Formulation: With patient Time For Goal Achievement: 09/12/16 Potential to Achieve Goals: Good  Plan Discharge plan needs to be updated                     AM-PAC PT "6 Clicks" Daily Activity     Outcome Measure   Help from another person eating meals?: None Help from another person taking care of personal grooming?: A Little Help from another person toileting, which includes using toliet, bedpan, or urinal?: A Lot Help from another person bathing (including washing, rinsing, drying)?: A Lot Help from another person to put on and taking off regular upper body clothing?: A Little Help from another person to put on and taking off regular lower body clothing?: A Lot 6 Click Score: 16    End of Session    OT Visit Diagnosis: Unsteadiness on feet (R26.81);Other abnormalities of gait and mobility (R26.89);Pain Pain - Right/Left: Right Pain - part of body: Leg   Activity Tolerance Patient tolerated treatment well   Patient Left in bed;with call  bell/phone within reach   Nurse Communication Mobility status        Time: 6269-4854 OT Time Calculation (min): 22 min  Charges: OT General Charges $OT Visit: 1 Visit OT Treatments $Therapeutic Activity: 8-22 mins  Lou Cal, OT Pager [phone] 09/12/2016    Raymondo Band 09/12/2016, 3:25 PM

## 2016-09-12 NOTE — Procedures (Signed)
Ultrasound-guided diagnostic and therapeutic right thoracentesis performed yielding 1 liter of slightly hazy, yellow fluid. No immediate complications. Follow-up chest x-ray pending. The fluid was sent to the lab for cytology.

## 2016-09-12 NOTE — Progress Notes (Addendum)
Met with pt to discuss DC plan. Pt states he is still unsure about going home vs. Going to SNF. Agrees to have CSW call son for input. Pt's son states family feels SNF for rehab would be best DC plan for pt. CSW provided bed offers and son states they will decide today and get back to CSW so [place] authorization request can be initiated.  Son states family can transport pt to radiation appointments from SNF if needed.   Sharren Bridge, MSW, LCSW Clinical Social Work 09/12/2016 [phone]  Pt son called CSW to inform that family has selected [facility]. CSW spoke with admissions who states they will check bed availability and call back if able to start authorization request today.

## 2016-09-12 NOTE — Progress Notes (Signed)
PT Cancellation Note  Patient Details Name: Edward Summers MRN: [id number] DOB: September 25, 1940   Cancelled Treatment:       US THORACENTESIS ASP PLEURAL SPACE W/IMG GUIDE [[id number] (Custom)]      [] Hide copied text [] Hover for attribution information Ultrasound-guided diagnostic and therapeutic right thoracentesis performed yielding 1 liter of slightly hazy, yellow fluid. No immediate complications. Follow-up chest x-ray pending. The fluid was sent to the lab for cytology.               Rica Koyanagi  PTA [facility]  Acute  Rehab Pager      [phone]

## 2016-09-12 NOTE — Progress Notes (Signed)
  Speech Language Pathology Treatment: Dysphagia  Patient Details Name: Edward Summers MRN: [id number] DOB: Aug 03, 1940 Today's Date: 09/12/2016 Time: 0258-5277 SLP Time Calculation (min) (ACUTE ONLY): 20 min  Assessment / Plan / Recommendation Clinical Impression  Pt sitting upright in bed, reports xerostomia and gustatory changes.  SLP reviewed esophageal precautions with pt verbally and provided in writing.  Pt admits to sensation of large pills lodging in throat and proximal esophagus at times.   He states large pills are clearing better with applesauce.  Advised pt to start meals with liquids, consume small frequent meals each day and stay upright after intake.  Provided information on possible esophagitis from XRT and foods/drinks to avoid if problematic.    Observed pt consuming water via straw - no indication of airway compromise or dysphagia. Suspect liquids may be tolerated better than solids due to possible mass impingement on esophagus.    No SLP follow up indicated at this time as pt educated to recommendations/precautions.     HPI HPI: Edward Summers a 76 y.o.male wiith history of HTN, HLD admitted with right lower extremity pain and swelling for 2 weeks and increased dspnea. Quit smoking 5 weeks ago. Underwent surgical thromoembolectomy of rith popliteal PT and AT arteries. Patient developed postprocedure atrial fibrillation with rapid ventricular rate and hypotension. Required right below the knee amputaion on 8/23. Rapid responde called for expectorating copious amounts of bloody sputum. Bronchoscopy performed 8/31 revealed large mass from carina to right lower lobe completely occuding and pt transferred to [facility] to initiate radiation 9/1.      SLP Plan  Continue with current plan of care       Recommendations  Diet recommendations: Dysphagia 3 (mechanical soft);Thin liquid Liquids provided via: Cup;No straw Medication Administration: Whole meds with puree (small pills  with water, large pills with applesauce) Supervision: Patient able to self feed;Intermittent supervision to cue for compensatory strategies Compensations: Slow rate;Small sips/bites;Follow solids with liquid (rest breaks) Postural Changes and/or Swallow Maneuvers: Seated upright 90 degrees;Upright 30-60 min after meal                Oral Care Recommendations: Oral care BID Follow up Recommendations: None SLP Visit Diagnosis: Dysphagia, unspecified (R13.10) Plan: Continue with current plan of care       [place], [place] [facility] SLP [phone]

## 2016-09-13 DIAGNOSIS — C7931 Secondary malignant neoplasm of brain: Secondary | ICD-10-CM

## 2016-09-13 LAB — CBC
HCT: 26.1 % — ABNORMAL LOW (ref 39.0–52.0)
HEMOGLOBIN: 8.6 g/dL — AB (ref 13.0–17.0)
MCH: 28.8 pg (ref 26.0–34.0)
MCHC: 33 g/dL (ref 30.0–36.0)
MCV: 87.3 fL (ref 78.0–100.0)
Platelets: 128 10*3/uL — ABNORMAL LOW (ref 150–400)
RBC: 2.99 MIL/uL — AB (ref 4.22–5.81)
RDW: 16.4 % — ABNORMAL HIGH (ref 11.5–15.5)
WBC: 8.7 10*3/uL (ref 4.0–10.5)

## 2016-09-13 NOTE — Progress Notes (Signed)
PROGRESS NOTE    Edward Summers  TFT:[id number] DOB: [birth year] DOA: 08/21/2016 PCP: Jilda Panda, MD    Brief Narrative: 76 year old male admitted with right lower extremity ischemia requiring right below-knee amputation. Post operative course complicated by respiratory decompensation and new onset atrial fibrillation. Chest CT noted right mainstem stem bronchus mass status post bronchoscopy on 09/05/2016, which came back positive for non-small cell lung cancer. He also underwent ultrasound-guided thoracentesis on 07/12/2016, cytology pending.radiation therapy started at this admission, with plans for outpatient oncology follow-up.  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 Mild surrounding edema but no mass effect.    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  Anemia chronic disease: Supportive care as needed, clinical follow-up  Hyponatremia: Mild, clinical follow-up   Thrombocytopenia: Mild, f/u clinically     DVT prophylaxis: on xarelto Code Status: full code Family Communication: n/a Disposition plan -SNF  when bed available  Consultants: radiation oncology, medical oncology, pulmonary medicine   Procedures: Bronchoscopy 8 to 02/07/2016  Ultrasound-guided paracentesis 09/12/2016  Antimicrobials:     Subjective: No fever or chills,no chest pain  Objective: Vitals:   09/12/16 1622 09/12/16 2006 09/12/16 2045 09/13/16 0404  BP: (!) 97/49 107/62  (!) 102/58  Pulse: 91 76  69  Resp: 15 16  17   Temp: 97.7 F (36.5 C) 97.9 F (36.6 C)  97.8 F (36.6 C)  TempSrc: Oral Oral  Oral  SpO2: 99% 97% 96% 97%  Weight:      Height:        Intake/Output Summary (Last 24 hours) at 09/13/16 1129 Last data filed at 09/13/16 0730  Gross per 24 hour  Intake              120 ml  Output              100 ml  Net               20 ml   Filed Weights   09/10/16 0200 09/11/16 0806 09/12/16 0900  Weight: 61.3 kg (135 lb 2.3 oz) 63.2 kg (139 lb 5.3 oz) 65.3 kg (143 lb 15.4 oz)    Examination:  General exam: Appears calm and comfortable  Respiratory system: diminished breath sounds with occasional wheezing. Respiratory effort normal. Cardiovascular system: S1 & S2 heard, RRR. No JVD, murmurs, rubs, gallops or clicks. No pedal edema. Gastrointestinal system: Abdomen is nondistended, soft and nontender. No organomegaly or masses felt. Normal bowel sounds heard. Central nervous system: Alert and oriented. No focal neurological deficits. Extremities: no cyanosis, right BKA wound with staples in situ is noted  Psychiatry: Judgement and insight appear normal. Mood & affect appropriate.     Data Reviewed: I have personally reviewed following labs and imaging studies  CBC:  Recent Labs Lab 09/09/16 0406 09/10/16 0323 09/11/16 0324 09/12/16  0344 09/13/16 0419  WBC 16.8* 15.1* 12.2* 9.4 8.7  NEUTROABS  --   --  10.2*  --   --   HGB 9.2* 8.8* 9.2* 8.8* 8.6*  HCT 27.8* 26.9* 27.5* 25.7* 26.1*  MCV 84.2 87.1 86.2 86.0 87.3  PLT 153 138* 138* 131* 151*   Basic Metabolic Panel:  Recent Labs Lab 09/07/16 0429 09/08/16 0532 09/09/16 0406 09/10/16 0323 09/11/16 0324  NA 129* 129* 128* 129* 131*  K 3.5 3.9 4.4 4.5 4.1  CL 99* 101 100* 103 104  CO2 24 22 23  20* 22  GLUCOSE 119* 105*  131* 105* 110*  BUN 9 6 9 13 12   CREATININE 0.54* 0.48* 0.52* 0.53* 0.55*  CALCIUM 7.2* 7.1* 7.2* 7.0* 7.5*   GFR: Estimated Creatinine Clearance: 74.8 mL/min (A) (by C-G formula based on SCr of 0.55 mg/dL (L)). Liver Function Tests: No results for input(s): AST, ALT, ALKPHOS, BILITOT, PROT, ALBUMIN in the last 168 hours. No results for input(s): LIPASE, AMYLASE in the last 168 hours. No results for input(s): AMMONIA in the last 168 hours. Coagulation Profile:  Recent Labs Lab 09/07/16 0429 09/08/16 0532 09/09/16 0406  INR 1.30 1.29 1.22   Cardiac Enzymes: No results for input(s): CKTOTAL, CKMB, CKMBINDEX, TROPONINI in the last 168 hours. BNP (last 3 results) No results for input(s): PROBNP in the last 8760 hours. HbA1C: No results for input(s): HGBA1C in the last 72 hours. CBG: No results for input(s): GLUCAP in the last 168 hours. Lipid Profile: No results for input(s): CHOL, HDL, LDLCALC, TRIG, CHOLHDL, LDLDIRECT in the last 72 hours. Thyroid Function Tests: No results for input(s): TSH, T4TOTAL, FREET4, T3FREE, THYROIDAB in the last 72 hours. Anemia Panel: No results for input(s): VITAMINB12, FOLATE, FERRITIN, TIBC, IRON, RETICCTPCT in the last 72 hours. Sepsis Labs: No results for input(s): PROCALCITON, LATICACIDVEN in the last 168 hours.  Recent Results (from the past 240 hour(s))  Culture, Urine     Status: None   Collection Time: 09/09/16  3:44 PM  Result Value Ref Range Status   Specimen Description URINE, CLEAN CATCH  Final   Special Requests NONE  Final   Culture   Final    NO GROWTH Performed at [facility], [address], [place], [place] [address]    Report Status 09/10/2016 FINAL  Final         Radiology Studies: Dg Chest 1 View  Result Date: 09/12/2016 CLINICAL DATA:  Post right thoracentesis EXAM: CHEST 1 VIEW COMPARISON:  09/02/2016 FINDINGS: Small right pleural effusion. No pneumothorax. Cardiomegaly. Right hilar mass again noted. No  focal opacity on the left or effusion. IMPRESSION: Small right pleural effusion with stable right hilar mass. No pneumothorax. Electronically Signed   By: Rolm Baptise M.D.   On: 09/12/2016 12:21   [name] VP Contrast  Result Date: 09/12/2016 CLINICAL DATA:  76 year old male with recently discovered right lung mass with mediastinal invasion. Staging. EXAM: MRI HEAD WITHOUT AND WITH CONTRAST TECHNIQUE: Multiplanar, multiecho pulse sequences of the brain and surrounding structures were obtained without and with intravenous contrast. CONTRAST:  13mL MULTIHANCE GADOBENATE DIMEGLUMINE 529 MG/ML IV SOLN COMPARISON:  No prior brain imaging. FINDINGS: Brain: Only axial postcontrast imaging could be obtained. There is a round 4 mm enhancing lesion in the left inferior temporal gyrus with mild surrounding edema. No significant mass effect. See series 12, image 18. No other No abnormal enhancement identified.  No dural thickening. No restricted diffusion to suggest acute infarction.  No midline shift, mass effect, ventriculomegaly, extra-axial collection or acute intracranial hemorrhage. Cervicomedullary junction and pituitary are within normal limits. Scattered nonenhancing nonspecific bilateral cerebral white matter T2 and FLAIR hyperintensity. Patchy T2 hyperintensity in the pons. No cortical encephalomalacia or chronic cerebral blood products identified. The deep gray matter nuclei and cerebellum appear normal. Vascular: Major intracranial vascular flow voids are preserved, the distal left vertebral artery is dominant. Skull and upper cervical spine: Visualized bone marrow signal is within normal limits. Negative visualized cervical spine. Sinuses/Orbits: Postoperative changes to the right globe, otherwise normal orbits soft tissues. Paranasal sinuses are well pneumatized. Other: Mild bilateral mastoid effusions. Visible internal auditory structures appear normal. Negative scalp soft tissues. IMPRESSION: 1. 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 Mild surrounding edema but no mass effect. 2. No other acute intracranial abnormality. Electronically Signed   By: Genevie Ann M.D.   On: 09/12/2016 15:59   US Thoracentesis Asp Pleural Space W/img Guide  Result Date: 09/12/2016 INDICATION: Patient with history of metastatic lung cancer, dyspnea, right pleural effusion; request made for diagnostic and therapeutic right thoracentesis. EXAM: ULTRASOUND GUIDED DIAGNOSTIC AND THERAPEUTIC RIGHT THORACENTESIS MEDICATIONS: None. COMPLICATIONS: None immediate. PROCEDURE: An ultrasound guided thoracentesis was thoroughly discussed with the patient and questions answered. The benefits, risks, alternatives and complications were also discussed. The patient understands and wishes to proceed with the procedure. Written consent was obtained. Ultrasound was performed to localize and mark an adequate pocket of fluid in the right chest. The area was then prepped and draped in the normal sterile fashion. 1% Lidocaine was used for local anesthesia. Under ultrasound guidance a Safe-T-Centesis catheter was introduced. Thoracentesis was performed. The catheter was removed and a dressing applied. FINDINGS: A total of approximately 1 liter of slightly hazy, yellow fluid was removed. Samples were sent to the laboratory as requested by the clinical team. IMPRESSION: Successful ultrasound guided diagnostic and therapeutic right thoracentesis yielding 1 liter of pleural fluid. Follow-up chest film shows no pneumothorax. Read by: Rowe Artyom, PA-C Electronically Signed   By: Lucrezia Europe M.D.   On: 09/12/2016 13:43        Scheduled Meds: . amiodarone  200 mg Oral Daily  . apixaban  5 mg Oral BID  . atorvastatin  40 mg Oral q1800  . dextromethorphan-guaiFENesin  1 tablet Oral BID  . docusate sodium  100 mg Oral Daily  . feeding supplement  1 Container Oral BID BM  . feeding  supplement (PRO-STAT SUGAR FREE 64)  30 mL Oral BID  . mometasone-formoterol  2 puff Inhalation BID  . pantoprazole  40 mg Oral Daily  . sodium chloride flush  3 mL Intravenous Q12H  . traZODone  50 mg Oral QHS   Continuous Infusions: . sodium chloride    . sodium chloride    . sodium chloride 10 mL/hr at 09/10/16 1055  . lactated ringers 10 mL/hr at 08/21/16 1709  . lactated ringers 10 mL/hr at 08/28/16 1126  . magnesium sulfate 1 - 4 g bolus IVPB       LOS: 23 days        OSEI-BONSU,Placido Hangartner, MD [phone] [facility]  If 7PM-7AM, please contact night-coverage [email or web address] Password [email or web address] 09/13/2016, 11:29 AM

## 2016-09-13 NOTE — Progress Notes (Signed)
Patient ID: Edward Summers, male   DOB: [birth year], 76 y.o.   MRN: [id number] Subjective: The patient is seen and examined today. He is feeling fine today with no specific complaints. He underwent MRI of the brain yesterday and unfortunately it showed suspicious metastatic brain lesion. The patient also underwent ultrasound-guided right thoracentesis recently with drainage of 1 L of pleural fluid. The cytology is still pending. He denied having any complaints today. He continues to tolerate his radiation therapy fairly well.  Objective: Vital signs in last 24 hours: Temp:  [97.7 F (36.5 C)-97.9 F (36.6 C)] 97.8 F (36.6 C) (09/08 0404) Pulse Rate:  [69-91] 69 (09/08 0404) Resp:  [15-17] 17 (09/08 0404) BP: (91-107)/(49-62) 102/58 (09/08 0404) SpO2:  [96 %-99 %] 97 % (09/08 0404)  Intake/Output from previous day: 09/07 0701 - 09/08 0700 In: 120 [P.O.:120] Out: 250 [Urine:250] Intake/Output this shift: No intake/output data recorded.  General appearance: alert, cooperative, fatigued and no distress Resp: diminished breath sounds RLL and dullness to percussion RLL Cardio: regular rate and rhythm, S1, S2 normal, no murmur, click, rub or gallop GI: soft, non-tender; bowel sounds normal; no masses,  no organomegaly Extremities: extremities normal, atraumatic, no cyanosis or edema  Lab Results:   Recent Labs  09/12/16 0344 09/13/16 0419  WBC 9.4 8.7  HGB 8.8* 8.6*  HCT 25.7* 26.1*  PLT 131* 128*   BMET  Recent Labs  09/11/16 0324  NA 131*  K 4.1  CL 104  CO2 22  GLUCOSE 110*  BUN 12  CREATININE 0.55*  CALCIUM 7.5*    Studies/Results: Dg Chest 1 View  Result Date: 09/12/2016 CLINICAL DATA:  Post right thoracentesis EXAM: CHEST 1 VIEW COMPARISON:  09/02/2016 FINDINGS: Small right pleural effusion. No pneumothorax. Cardiomegaly. Right hilar mass again noted. No focal opacity on the left or effusion. IMPRESSION: Small right pleural effusion with stable right hilar mass.  No pneumothorax. Electronically Signed   By: Rolm Baptise M.D.   On: 09/12/2016 12:21   [name] BJ Contrast  Result Date: 09/12/2016 CLINICAL DATA:  76 year old male with recently discovered right lung mass with mediastinal invasion. Staging. EXAM: MRI HEAD WITHOUT AND WITH CONTRAST TECHNIQUE: Multiplanar, multiecho pulse sequences of the brain and surrounding structures were obtained without and with intravenous contrast. CONTRAST:  67mL MULTIHANCE GADOBENATE DIMEGLUMINE 529 MG/ML IV SOLN COMPARISON:  No prior brain imaging. FINDINGS: Brain: Only axial postcontrast imaging could be obtained. There is a round 4 mm enhancing lesion in the left inferior temporal gyrus with mild surrounding edema. No significant mass effect. See series 12, image 18. No other No abnormal enhancement identified.  No dural thickening. No restricted diffusion to suggest acute infarction. No midline shift, mass effect, ventriculomegaly, extra-axial collection or acute intracranial hemorrhage. Cervicomedullary junction and pituitary are within normal limits. Scattered nonenhancing nonspecific bilateral cerebral white matter T2 and FLAIR hyperintensity. Patchy T2 hyperintensity in the pons. No cortical encephalomalacia or chronic cerebral blood products identified. The deep gray matter nuclei and cerebellum appear normal. Vascular: Major intracranial vascular flow voids are preserved, the distal left vertebral artery is dominant. Skull and upper cervical spine: Visualized bone marrow signal is within normal limits. Negative visualized cervical spine. Sinuses/Orbits: Postoperative changes to the right globe, otherwise normal orbits soft tissues. Paranasal sinuses are well pneumatized. Other: Mild bilateral mastoid effusions. Visible internal auditory structures appear normal. Negative scalp soft tissues. IMPRESSION: 1. 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  Mild surrounding edema but no mass effect. 2. No other acute intracranial abnormality. Electronically Signed   By: Genevie Ann M.D.   On: 09/12/2016 15:59   US Thoracentesis Asp Pleural Space W/img Guide  Result Date: 09/12/2016 INDICATION: Patient with history of metastatic lung cancer, dyspnea, right pleural effusion; request made for diagnostic and therapeutic right thoracentesis. EXAM: ULTRASOUND GUIDED DIAGNOSTIC AND THERAPEUTIC RIGHT THORACENTESIS MEDICATIONS: None. COMPLICATIONS: None immediate. PROCEDURE: An ultrasound guided thoracentesis was thoroughly discussed with the patient and questions answered. The benefits, risks, alternatives and complications were also discussed. The patient understands and wishes to proceed with the procedure. Written consent was obtained. Ultrasound was performed to localize and mark an adequate pocket of fluid in the right chest. The area was then prepped and draped in the normal sterile fashion. 1% Lidocaine was used for local anesthesia. Under ultrasound guidance a Safe-T-Centesis catheter was introduced. Thoracentesis was performed. The catheter was removed and a dressing applied. FINDINGS: A total of approximately 1 liter of slightly hazy, yellow fluid was removed. Samples were sent to the laboratory as requested by the clinical team. IMPRESSION: Successful ultrasound guided diagnostic and therapeutic right thoracentesis yielding 1 liter of pleural fluid. Follow-up chest film shows no pneumothorax. Read by: Rowe Macedonio, PA-C Electronically Signed   By: Lucrezia Europe M.D.   On: 09/12/2016 13:43    Medications: I have reviewed the patient's current medications.  Assessment/Plan: This is a very pleasant 76 years old white male recently diagnosed with metastatic non-small cell lung cancer presented with central obstructive right upper lobe lung mass in addition to highly suspicious malignant right pleural effusion and now new metastatic brain lesion. I discussed the MRI  results with the patient today. I recommended for him to continue his current palliative radiotherapy to the chest under the care of Dr. [name]. We will also consider the patient for stereotactic radiotherapy to the solitary brain metastases by Dr. [name]. The cytology of the right pleural fluid is still pending but this is likely malignant. I will arrange for the patient a follow-up appointment with me at the [facility] after discharge for more detailed discussion of his treatment options after completion of the radiotherapy.   LOS: 23 days    Eilleen Kempf 09/13/2016

## 2016-09-14 LAB — CBC
HEMATOCRIT: 27.2 % — AB (ref 39.0–52.0)
HEMOGLOBIN: 9 g/dL — AB (ref 13.0–17.0)
MCH: 28 pg (ref 26.0–34.0)
MCHC: 33.1 g/dL (ref 30.0–36.0)
MCV: 84.5 fL (ref 78.0–100.0)
Platelets: 135 10*3/uL — ABNORMAL LOW (ref 150–400)
RBC: 3.22 MIL/uL — ABNORMAL LOW (ref 4.22–5.81)
RDW: 15.9 % — ABNORMAL HIGH (ref 11.5–15.5)
WBC: 9 10*3/uL (ref 4.0–10.5)

## 2016-09-14 LAB — BASIC METABOLIC PANEL
Anion gap: 5 (ref 5–15)
BUN: 10 mg/dL (ref 6–20)
CHLORIDE: 99 mmol/L — AB (ref 101–111)
CO2: 24 mmol/L (ref 22–32)
CREATININE: 0.54 mg/dL — AB (ref 0.61–1.24)
Calcium: 7.2 mg/dL — ABNORMAL LOW (ref 8.9–10.3)
GFR calc non Af Amer: >60 mL/min (ref >60)
Glucose, Bld: 114 mg/dL — ABNORMAL HIGH (ref 65–99)
POTASSIUM: 3.7 mmol/L (ref 3.5–5.1)
Sodium: 128 mmol/L — ABNORMAL LOW (ref 135–145)

## 2016-09-14 NOTE — Progress Notes (Signed)
PROGRESS NOTE    Edward Summers  YTK:[id number] DOB: Oct 14, 1940 DOA: 08/21/2016 PCP: Jilda Panda, MD    Brief Narrative: 76 year old male admitted with right lower extremity ischemia requiring right below-knee amputation. Post operative course complicated by respiratory decompensation and new onset atrial fibrillation. Chest CT noted right mainstem stem bronchus mass status post bronchoscopy on 09/05/2016, which came back positive for non-small cell lung cancer. He also underwent ultrasound-guided thoracentesis on 07/12/2016, cytology pending.radiation therapy started at this admission, with plans for outpatient oncology follow-up.  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 Mild surrounding edema but no mass effect.    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  Anemia chronic disease: Supportive care as needed, clinical follow-up  Hyponatremia: Mild Gentle saline infusion Clinical follow-up Thrombocytopenia: Mild, f/u clinically     DVT prophylaxis: on xarelto Code Status: full code Family Communication: n/a Disposition plan -SNF  when bed available  Consultants: radiation oncology, medical oncology, pulmonary medicine   Procedures: Bronchoscopy 8 to 02/07/2016  Ultrasound-guided paracentesis  09/12/2016  Antimicrobials:    Subjective: No fever or chills,no chest pain  Objective: Vitals:   09/14/16 0435 09/14/16 0734 09/14/16 0753 09/14/16 1323  BP: 105/68   104/70  Pulse: 78 73  74  Resp: 16 17  16   Temp: 98.3 F (36.8 C)   98.2 F (36.8 C)  TempSrc: Oral   Oral  SpO2: 99% 95%  99%  Weight:   60 kg (132 lb 4.4 oz)   Height:        Intake/Output Summary (Last 24 hours) at 09/14/16 1342 Last data filed at 09/14/16 1300  Gross per 24 hour  Intake              480 ml  Output              500 ml  Net              -20 ml   Filed Weights   09/11/16 0806 09/12/16 0900 09/14/16 0753  Weight: 63.2 kg (139 lb 5.3 oz) 65.3 kg (143 lb 15.4 oz) 60 kg (132 lb 4.4 oz)    Examination:  General exam: Appears calm and comfortable  Respiratory system: diminished breath sounds with occasional wheezing. Respiratory effort normal. Cardiovascular system: S1 & S2 heard, RRR. No JVD, murmurs, rubs, gallops or clicks. No pedal edema. Gastrointestinal system: Abdomen is nondistended, soft and nontender. No organomegaly or masses felt. Normal bowel sounds heard. Central nervous system: Alert and oriented. No focal neurological deficits. Extremities: no cyanosis, right BKA wound with staples in situ is noted  Psychiatry: Judgement and insight appear normal. Mood & affect appropriate.     Data Reviewed: I have personally reviewed following labs and imaging studies  CBC:  Recent Labs Lab 09/10/16 0323 09/11/16 0324 09/12/16 0344 09/13/16  0419 09/14/16 0419  WBC 15.1* 12.2* 9.4 8.7 9.0  NEUTROABS  --  10.2*  --   --   --   HGB 8.8* 9.2* 8.8* 8.6* 9.0*  HCT 26.9* 27.5* 25.7* 26.1* 27.2*  MCV 87.1 86.2 86.0 87.3 84.5  PLT 138* 138* 131* 128* 469*   Basic Metabolic Panel:  Recent Labs Lab 09/08/16 0532 09/09/16 0406 09/10/16 0323 09/11/16 0324 09/14/16 0419  NA 129* 128* 129* 131* 128*  K 3.9 4.4 4.5 4.1 3.7  CL 101 100* 103 104 99*  CO2 22 23 20* 22 24  GLUCOSE  105* 131* 105* 110* 114*  BUN 6 9 13 12 10   CREATININE 0.48* 0.52* 0.53* 0.55* 0.54*  CALCIUM 7.1* 7.2* 7.0* 7.5* 7.2*   GFR: Estimated Creatinine Clearance: 68.8 mL/min (A) (by C-G formula based on SCr of 0.54 mg/dL (L)). Liver Function Tests: No results for input(s): AST, ALT, ALKPHOS, BILITOT, PROT, ALBUMIN in the last 168 hours. No results for input(s): LIPASE, AMYLASE in the last 168 hours. No results for input(s): AMMONIA in the last 168 hours. Coagulation Profile:  Recent Labs Lab 09/08/16 0532 09/09/16 0406  INR 1.29 1.22   Cardiac Enzymes: No results for input(s): CKTOTAL, CKMB, CKMBINDEX, TROPONINI in the last 168 hours. BNP (last 3 results) No results for input(s): PROBNP in the last 8760 hours. HbA1C: No results for input(s): HGBA1C in the last 72 hours. CBG: No results for input(s): GLUCAP in the last 168 hours. Lipid Profile: No results for input(s): CHOL, HDL, LDLCALC, TRIG, CHOLHDL, LDLDIRECT in the last 72 hours. Thyroid Function Tests: No results for input(s): TSH, T4TOTAL, FREET4, T3FREE, THYROIDAB in the last 72 hours. Anemia Panel: No results for input(s): VITAMINB12, FOLATE, FERRITIN, TIBC, IRON, RETICCTPCT in the last 72 hours. Sepsis Labs: No results for input(s): PROCALCITON, LATICACIDVEN in the last 168 hours.  Recent Results (from the past 240 hour(s))  Culture, Urine     Status: None   Collection Time: 09/09/16  3:44 PM  Result Value Ref Range Status   Specimen Description URINE, CLEAN CATCH  Final   Special Requests NONE  Final   Culture   Final    NO GROWTH Performed at [facility], [address] [place], [place] [address]    Report Status 09/10/2016 FINAL  Final         Radiology Studies: [name] WU Contrast  Result Date: 09/12/2016 CLINICAL DATA:  76 year old male with recently discovered right lung mass with mediastinal invasion. Staging. EXAM: MRI HEAD WITHOUT AND WITH CONTRAST TECHNIQUE: Multiplanar, multiecho pulse  sequences of the brain and surrounding structures were obtained without and with intravenous contrast. CONTRAST:  86mL MULTIHANCE GADOBENATE DIMEGLUMINE 529 MG/ML IV SOLN COMPARISON:  No prior brain imaging. FINDINGS: Brain: Only axial postcontrast imaging could be obtained. There is a round 4 mm enhancing lesion in the left inferior temporal gyrus with mild surrounding edema. No significant mass effect. See series 12, image 18. No other No abnormal enhancement identified.  No dural thickening. No restricted diffusion to suggest acute infarction. No midline shift, mass effect, ventriculomegaly, extra-axial collection or acute intracranial hemorrhage. Cervicomedullary junction and pituitary are within normal limits. Scattered nonenhancing nonspecific bilateral cerebral white matter T2 and FLAIR hyperintensity. Patchy T2 hyperintensity in the pons. No cortical encephalomalacia or chronic cerebral blood products identified. The deep gray matter nuclei and cerebellum appear normal. Vascular: Major intracranial vascular flow voids are preserved, the distal left vertebral artery is dominant. Skull and upper  cervical spine: Visualized bone marrow signal is within normal limits. Negative visualized cervical spine. Sinuses/Orbits: Postoperative changes to the right globe, otherwise normal orbits soft tissues. Paranasal sinuses are well pneumatized. Other: Mild bilateral mastoid effusions. Visible internal auditory structures appear normal. Negative scalp soft tissues. IMPRESSION: 1. 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 Mild surrounding edema but no mass effect. 2. No other acute intracranial abnormality. Electronically Signed   By: Genevie Ann M.D.   On: 09/12/2016 15:59        Scheduled Meds: . amiodarone  200 mg Oral Daily  . apixaban  5 mg Oral BID  . atorvastatin  40 mg Oral q1800  . dextromethorphan-guaiFENesin  1 tablet Oral BID   . docusate sodium  100 mg Oral Daily  . feeding supplement  1 Container Oral BID BM  . feeding supplement (PRO-STAT SUGAR FREE 64)  30 mL Oral BID  . mometasone-formoterol  2 puff Inhalation BID  . pantoprazole  40 mg Oral Daily  . sodium chloride flush  3 mL Intravenous Q12H  . traZODone  50 mg Oral QHS   Continuous Infusions: . sodium chloride    . sodium chloride    . sodium chloride 10 mL/hr at 09/10/16 1055  . lactated ringers 10 mL/hr at 08/21/16 1709  . lactated ringers 10 mL/hr at 08/28/16 1126  . magnesium sulfate 1 - 4 g bolus IVPB       LOS: 24 days        OSEI-BONSU,Kearstin Learn, MD [phone] [facility]  If 7PM-7AM, please contact night-coverage [email or web address] Password [email or web address] 09/14/2016, 1:42 PM

## 2016-09-15 ENCOUNTER — Ambulatory Visit
Admit: 2016-09-15 | Discharge: 2016-09-15 | Disposition: A | Discharge status: Home / Self Care | Payer: Medicare HMO | Attending: Radiation Oncology | Admitting: Radiation Oncology

## 2016-09-15 DIAGNOSIS — E871 Hypo-osmolality and hyponatremia: Secondary | ICD-10-CM

## 2016-09-15 DIAGNOSIS — I700 Atherosclerosis of aorta: Secondary | ICD-10-CM

## 2016-09-15 DIAGNOSIS — Z510 Encounter for antineoplastic radiation therapy: Secondary | ICD-10-CM | POA: Diagnosis not present

## 2016-09-15 DIAGNOSIS — D3500 Benign neoplasm of unspecified adrenal gland: Secondary | ICD-10-CM

## 2016-09-15 LAB — CBC
HEMATOCRIT: 25.1 % — AB (ref 39.0–52.0)
Hemoglobin: 8.5 g/dL — ABNORMAL LOW (ref 13.0–17.0)
MCH: 28.6 pg (ref 26.0–34.0)
MCHC: 33.9 g/dL (ref 30.0–36.0)
MCV: 84.5 fL (ref 78.0–100.0)
Platelets: 121 10*3/uL — ABNORMAL LOW (ref 150–400)
RBC: 2.97 MIL/uL — ABNORMAL LOW (ref 4.22–5.81)
RDW: 15.8 % — ABNORMAL HIGH (ref 11.5–15.5)
WBC: 6.2 10*3/uL (ref 4.0–10.5)

## 2016-09-15 MED ORDER — AMIODARONE HCL 200 MG PO TABS: 200.0000 mg | ORAL_TABLET | Freq: Every day | ORAL | 0 refills | Status: AC

## 2016-09-15 MED ORDER — APIXABAN 5 MG PO TABS: 5.0000 mg | ORAL_TABLET | Freq: Two times a day (BID) | ORAL | 0 refills | Status: AC

## 2016-09-15 NOTE — Progress Notes (Signed)
Discharge planned for today; however insurance auth still pending for SNF. Without auth, patient will either have to pay out of pocket or go home. In this very complicated and sick patient with many comorbidities, I do not feel comfortable sending patient home today. Await auth and keep one more day.   Dessa Phi, DO [facility] [email or web address] Password [facility] 09/15/2016, 4:37 PM

## 2016-09-15 NOTE — Progress Notes (Addendum)
CSW assisting with dc planning. [place] contacted. Still no decision from [facility] regarding approval for SNF placement. Family has been updated. CSW will contact SNF again by 3pm to check status of insurance authorization. SNF will contact CSW if approval is received prior to [place] LCSW [phone]  15:30 - CSW left VM for Admissions at [facility] return call to check [facility] authorization status. MD / Son have been updated.  Werner Lean LCSW [phone]

## 2016-09-15 NOTE — Discharge Summary (Addendum)
Physician Discharge Summary  Edward Summers BJY:[id number] DOB: 1940/09/02 DOA: 08/21/2016  PCP: Jilda Panda, MD  Admit date: 08/21/2016 Discharge date: 09/16/2016  Admitted From: Home Disposition:  SNF   Recommendations for Outpatient Follow-up:  1. Follow up with PCP in 1 week 2. Follow up with Vascular Surgery in 1 week for staple removal (remove by 9/14) and in 4 weeks for right BKA follow up  3. Follow up with Cardiology A fib clinic, Dr. Rayann Heman will arrange  4. Continue amiodarone 200mg  daily for 4 weeks, then discontinue pending cardiology input 5. Follow up with radiation oncology Dr. [name] and finish radiation course. Currently scheduled daily until 9/17 6. Follow up with Dr. Julien Nordmann at [facility], he will arrange  7. Please obtain BMP/CBC in 1 week  8. Please follow up on the following pending results: cytology results from thoracentesis   Discharge Condition: Stable, improved CODE STATUS: Full  Diet recommendation: Heart healthy   Brief/Interim Summary He has had a very significant and complicated course of hospitalization. In brief, Edward Summers is an 76 y.o. male hypertension, hyperlipidemia, emphysema, who was admitted by vascular surgery service for acute left leg limb ischemia on 08/21/2016. He underwent surgical thromboembolectomy of R popliteal PT and AT arteries. He developed new onset A. fib with RVR in PACU. He was started on IV amiodarone, IV heparin (transitioned to Eliquis). Cardiology was consulted as well. He was also treated for COPD exacerbation with Solu-Medrol and breathing treatments. He did convert to normal sinus during hospitalization, and had multiple episodes of A Fib RVR during hospitalization. On 8/19, rapid response was called due to hemoptysis. Eliquis was stopped and hemoptysis stopped. 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 He developed an episode of hemoptysis again, underwent CT chest, which revealed multiple lung masses. Pulmonology was consulted. Patient underwent bronchoscopy on 8/31. Due to right mainstem bronchus obstruction, radiation oncology was consulted and patient underwent emergency radiation treatment to the central mediastinum to help preserve airway patency. Oncology was also consulted. 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 He also underwent right thoracentesis 9/7. Cytology from pleural fluid is pending at time of discharge.   Discharge Diagnoses:  Principal Problem:   Ischemic pain of foot, right Active Problems:   New onset atrial fibrillation (HCC)   Atrial fibrillation with RVR (HCC)   HLD (hyperlipidemia)   Essential hypertension   COPD with acute exacerbation (HCC)   Ischemic foot   Acute respiratory distress   Lung mass   Pressure injury of skin   Primary cancer of right lower lobe of lung (HCC)   Hyponatremia   Adrenal adenoma   Aortic atherosclerosis [facility])   Discharge Instructions  Discharge Instructions    Call MD for:  difficulty breathing, headache or visual disturbances    Complete by:  As directed    Call MD for:  extreme fatigue    Complete by:  As directed    Call MD for:  hives    Complete by:  As directed    Call MD for:  persistant dizziness or light-headedness    Complete by:  As directed    Call MD for:  persistant nausea and vomiting    Complete by:  As directed    Call MD for:  severe uncontrolled pain    Complete by:  As directed  Call MD for:  temperature >100.4    Complete by:  As directed    Diet - low sodium heart healthy    Complete by:  As directed    Discharge instructions    Complete by:  As directed    You were cared for by a hospitalist during your hospital stay. If you have any questions about your discharge medications or the care you received while you were in the hospital after you are  discharged, you can call the unit and asked to speak with the hospitalist on call if the hospitalist that took care of you is not available. Once you are discharged, your primary care physician will handle any further medical issues. Please note that NO REFILLS for any discharge medications will be authorized once you are discharged, as it is imperative that you return to your primary care physician (or establish a relationship with a primary care physician if you do not have one) for your aftercare needs so that they can reassess your need for medications and monitor your lab values.   Increase activity slowly    Complete by:  As directed      Allergies as of 09/15/2016      Reactions   Zithromax [azithromycin] Other (See Comments)   Side pain      Medication List    STOP taking these medications   lisinopril 10 MG tablet Commonly known as:  PRINIVIL,ZESTRIL   predniSONE 10 MG tablet Commonly known as:  DELTASONE     TAKE these medications   amiodarone 200 MG tablet Commonly known as:  PACERONE Take 1 tablet (200 mg total) by mouth daily.   apixaban 5 MG Tabs tablet Commonly known as:  ELIQUIS Take 1 tablet (5 mg total) by mouth 2 (two) times daily.   atorvastatin 40 MG tablet Commonly known as:  LIPITOR Take 40 mg by mouth at bedtime.            Discharge Care Instructions        Start     Ordered   09/16/16 0000  amiodarone (PACERONE) 200 MG tablet  Daily     09/15/16 1149   09/15/16 0000  apixaban (ELIQUIS) 5 MG TABS tablet  2 times daily     09/15/16 1149   09/15/16 0000  Increase activity slowly     09/15/16 1149   09/15/16 0000  Diet - low sodium heart healthy     09/15/16 1149   09/15/16 0000  Discharge instructions    Comments:  You were cared for by a hospitalist during your hospital stay. If you have any questions about your discharge medications or the care you received while you were in the hospital after you are discharged, you can call the unit and  asked to speak with the hospitalist on call if the hospitalist that took care of you is not available. Once you are discharged, your primary care physician will handle any further medical issues. Please note that NO REFILLS for any discharge medications will be authorized once you are discharged, as it is imperative that you return to your primary care physician (or establish a relationship with a primary care physician if you do not have one) for your aftercare needs so that they can reassess your need for medications and monitor your lab values.   09/15/16 1149   09/15/16 0000  Call MD for:  temperature >100.4     09/15/16 1149   09/15/16 0000  Call MD  for:  persistant nausea and vomiting     09/15/16 1149   09/15/16 0000  Call MD for:  severe uncontrolled pain     09/15/16 1149   09/15/16 0000  Call MD for:  extreme fatigue     09/15/16 1149   09/15/16 0000  Call MD for:  persistant dizziness or light-headedness     09/15/16 1149   09/15/16 0000  Call MD for:  hives     09/15/16 1149   09/15/16 0000  Call MD for:  difficulty breathing, headache or visual disturbances     09/15/16 1149      Contact information for follow-up providers    Elam Dutch, MD Follow up in 4 week(s).   Specialties:  Vascular Surgery, Cardiology Why:  Our office will call you to arrange an appointment   Call in 1 week for staple removal  Contact information: [place] [address] [phone]        Jilda Panda, MD. Schedule an appointment as soon as possible for a visit in 1 week(s).   Specialty:  Internal Medicine Contact information: [address] [place] [address] [phone]        Curt Bears, MD Follow up.   Specialty:  Oncology Contact information: [place] [address] [phone]        Thompson Grayer, MD Follow up.   Specialty:  Cardiology Why:  A fib clinic  Contact information: [place] [address] [address] [phone]        Tyler Pita, MD Follow up.   Specialty:  Radiation Oncology Contact information: [place] [address] [phone]            Contact information for after-discharge care    Destination    [facility] Follow up.   Specialty:  [place] information: [address] [place] [phone]                 Allergies  Allergen Reactions  . Zithromax [Azithromycin] Other (See Comments)    Side pain    Consultations:  Vascular surgery  PCCM  Cardiology  Oncology  IR  Radiation oncology    Procedures/Studies: Dg Chest 1 View  Result Date: 09/12/2016 CLINICAL DATA:  Post right thoracentesis EXAM: CHEST 1 VIEW COMPARISON:  09/02/2016 FINDINGS: Small right pleural effusion. No pneumothorax. Cardiomegaly. Right hilar mass again noted. No focal opacity on the left or effusion. IMPRESSION: Small right pleural effusion with stable right hilar mass. No pneumothorax. Electronically Signed   By: Rolm Baptise M.D.   On: 09/12/2016 12:21   Dg Chest 2 View  Result Date: 08/23/2016 CLINICAL DATA:  Dyspnea. Hx of smoking. Emphysema of lung, HTN, PNA and diabetes. Pt came into hospital for LLE swelling and numbness. EXAM: CHEST  2 VIEW COMPARISON:  08/21/2016 FINDINGS: Cardiac silhouette is mildly enlarged. No mediastinal or hilar masses. Small, right greater than left, pleural effusions with associated lung base opacity, most likely atelectasis. Right lower lobe pneumonia should be considered likely if there are consistent clinical symptoms. Pleural effusions have increased when compared the prior exam. Remainder of the lungs is clear. No pneumothorax. IMPRESSION: 1. Mild increase in lung base opacity which is likely due to an increase in pleural effusions, right greater than left. 2. Persistent right lung base opacity which may reflect atelectasis, pneumonia  or a combination. 3. No evidence of pulmonary edema.  Electronically Signed   By: Lajean Manes M.D.   On: 08/23/2016 08:30   Ct Chest Wo Contrast  Result Date: 09/09/2016 CLINICAL DATA:  76 year old male former smoker with history of bilateral lower extremity pain and swelling. Status post right below-the-knee amputation on 08/28/2016 for acute limb ischemia. Multiple lung lesions noted on recent chest CT. Followup study. EXAM: CT CHEST WITHOUT CONTRAST TECHNIQUE: Multidetector CT imaging of the chest was performed following the standard protocol without IV contrast. COMPARISON:  Chest CT 09/03/2016. FINDINGS: Cardiovascular: Heart size is normal. There is no significant pericardial fluid, thickening or pericardial calcification. There is aortic atherosclerosis, as well as atherosclerosis of the great vessels of the mediastinum and the coronary arteries, including calcified atherosclerotic plaque in the left main, left anterior descending, left circumflex and right coronary arteries. Mediastinum/Nodes: Multiple enlarged mediastinal and hilar lymph nodes better demonstrated on the recent contrast enhanced chest CT. The largest lymph nodes on today's examination include a low right paratracheal node measuring up to 2 cm in short axis and right hilar lymph node measuring up to 1.7 cm in short axis. In addition, there is a large invasive mass centered in the subcarinal position, which is poorly evaluated on today's noncontrast CT examination but appears grossly similar in size to the prior study at which point this measured 8.5 x 6.6 cm. This mass exerts significant mass effect upon adjacent structures, most notably displacing the left atrium anteriorly and distal half of the esophagus to the left side. There is again invasion of the left mainstem bronchus which appears slightly occluded. No axillary lymphadenopathy. Lungs/Pleura: Near complete collapse/consolidation of the right lower lobe (minimal aerated superior  segment right lower lobe). Partial collapse and consolidation of the lateral segment of the right middle lobe. Large infiltrative mass centered in the right hilar region involving the central aspects of the right upper, middle and lower lobes, poorly evaluated on today's noncontrast CT examination, but grossly similar to the recent prior study. Patchy multifocal nodular appearing airspace disease again noted in the lungs bilaterally, with the largest nodule in the right upper lobe near the apex (axial image 31 of series 5) measuring 14 x 11 mm with some central cavitation which is new compared to the prior study. Peripheral area of ground-glass attenuation in the left upper lobe new compared to the prior examination, presumably infectious/inflammatory in etiology. 6 mm left upper lobe pulmonary nodule (axial image 63 of series 5), and 8 mm left lower lobe pulmonary nodule (axial image 105 of series 5) appears similar to the prior study. Moderate right pleural effusion, likely malignant, similar to the prior examination. Trace left pleural effusion also unchanged. Upper Abdomen: Amorphous high attenuation material lying dependently in the gallbladder, compatible with biliary sludge. Exophytic 2.5 cm high attenuation lesion extending from the posterior aspect of the upper pole the left kidney, incompletely characterized on today's noncontrast CT examination, but likely to represent a proteinaceous/hemorrhagic cysts. Aortic atherosclerosis. Musculoskeletal: There are no aggressive appearing lytic or blastic lesions noted in the visualized portions of the skeleton. IMPRESSION: 1. Overall, there has been very little change in the appearance the chest compared to recent contrast enhanced chest CT 09/04/2014. There continues to be a central mass in the right lung with direct invasion of the mediastinum, including endobronchial invasion obstructing the right mainstem bronchus, as detailed above. Further evaluation with  bronchoscopy for biopsy and PET-CT is suggested for diagnostic and staging purposes. 2. Slight interval increase in size of cavitary nodule in the  apex of the right upper lobe, presumably infectious related to central obstruction. There also at areas of ground-glass attenuation scattered throughout the left upper lobe which are also likely infectious or inflammatory in etiology. 3. Aortic atherosclerosis, in addition to left main and 3 vessel coronary artery disease. Aortic Atherosclerosis (ICD10-I70.0). Electronically Signed   By: Vinnie Langton M.D.   On: 09/09/2016 14:55   Ct Chest W Contrast  Result Date: 09/03/2016 CLINICAL DATA:  Followup of lung nodule. Abnormal chest radiograph, demonstrating possible right hilar soft tissue fullness. EXAM: CT CHEST WITH CONTRAST TECHNIQUE: Multidetector CT imaging of the chest was performed during intravenous contrast administration. CONTRAST:  37mL ISOVUE-300 IOPAMIDOL (ISOVUE-300) INJECTION 61% COMPARISON:  Multiple chest radiographs, most recent 08/25/2016. Chest CT 11/24/2005. FINDINGS: Cardiovascular: Aortic and branch vessel atherosclerosis. Tortuous thoracic aorta. Mild cardiomegaly, without pericardial effusion. Multivessel coronary artery atherosclerosis. No central pulmonary embolism, on this non-dedicated study. Mediastinum/Nodes: Low right jugular/ supraclavicular adenopathy. Example at 11 mm on image 9/ series 3. Necrotic mediastinal adenopathy, with a right paratracheal node measuring 1.9 cm on image 27/series 3. Right hilar adenopathy including at 2.8 cm. Mass-effect upon the esophagus, secondary to adenopathy. There is a fluid level in the upper esophagus. Lungs/Pleura: Small right and trace left-sided pleural effusions. Mild loculation of right-sided effusion superiorly. Endobronchial obstruction involving the right mainstem bronchus including on image 59/series 4. Central right lower lobe and infrahilar soft tissue mass which is most consistent with  primary bronchogenic carcinoma, direct mediastinal invasion, and contiguous adenopathy. In combination, this measures on the order of 8.5 x 6.6 cm on image 78/series 3. Postobstructive pneumonitis throughout the right lower lobe and less so right middle lobes. Right upper lobe patchy airspace and ground-glass opacity is likely related to postobstructive pneumonitis. Left-sided pulmonary nodules, including 8 mm nodule in the left lower lobe on image 91/series 4, similar. A left upper lobe 5 mm nodule on image 50/series 4 is new. Smaller left upper lobe nodules are present on the remote prior. Upper Abdomen: Normal imaged portions of the liver, spleen, stomach, pancreas, gallbladder, right adrenal gland, and right kidney. Left adrenal nodularity is unchanged. An interpolar exophytic left renal lesion measures 2.4 cm and greater than fluid density. 2.1 cm back on 11/24/2005. Probable sebaceous cyst about the anterior left chest wall. Musculoskeletal: Moderate thoracic spondylosis. IMPRESSION: 1. 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 Direct mediastinal invasion, nodal metastasis within the chest/low neck, and endobronchial obstruction. 2. Postobstructive collapse and pneumonitis, as detailed above. 3. Right larger than left pleural effusions with mild loculation involving the right-sided pleural effusion. 4. Upper esophageal fluid level could represent dysmotility or partial obstruction secondary to the mediastinal mass. 5. Coronary artery atherosclerosis. Aortic Atherosclerosis (ICD10-I70.0). 6. Left renal lesion is technically indeterminate but likely a complex cyst, given presence back in 2007. 7. Left-sided pulmonary nodules, primarily similar and therefore benign. A left upper lobe 5 mm nodule is new and could represent a metastasis. 8. The patient may be predisposed to SVC syndrome. Electronically Signed   By: Abigail Miyamoto M.D.   On: 09/03/2016 16:18   [name] NU Contrast  Result  Date: 09/12/2016 CLINICAL DATA:  76 year old male with recently discovered right lung mass with mediastinal invasion. Staging. EXAM: MRI HEAD WITHOUT AND WITH CONTRAST TECHNIQUE: Multiplanar, multiecho pulse sequences of the brain and surrounding structures were obtained without and with intravenous contrast. CONTRAST:  32mL MULTIHANCE GADOBENATE DIMEGLUMINE 529 MG/ML IV SOLN COMPARISON:  No prior brain imaging. FINDINGS: Brain: Only axial  postcontrast imaging could be obtained. There is a round 4 mm enhancing lesion in the left inferior temporal gyrus with mild surrounding edema. No significant mass effect. See series 12, image 18. No other No abnormal enhancement identified.  No dural thickening. No restricted diffusion to suggest acute infarction. No midline shift, mass effect, ventriculomegaly, extra-axial collection or acute intracranial hemorrhage. Cervicomedullary junction and pituitary are within normal limits. Scattered nonenhancing nonspecific bilateral cerebral white matter T2 and FLAIR hyperintensity. Patchy T2 hyperintensity in the pons. No cortical encephalomalacia or chronic cerebral blood products identified. The deep gray matter nuclei and cerebellum appear normal. Vascular: Major intracranial vascular flow voids are preserved, the distal left vertebral artery is dominant. Skull and upper cervical spine: Visualized bone marrow signal is within normal limits. Negative visualized cervical spine. Sinuses/Orbits: Postoperative changes to the right globe, otherwise normal orbits soft tissues. Paranasal sinuses are well pneumatized. Other: Mild bilateral mastoid effusions. Visible internal auditory structures appear normal. Negative scalp soft tissues. IMPRESSION: 1. 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 Mild surrounding edema but no mass effect. 2. No other acute intracranial abnormality. Electronically Signed   By: Genevie Ann  M.D.   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 Abdominal pain and fever. Evaluate for potential abscess. EXAM: CT ABDOMEN AND PELVIS WITH CONTRAST TECHNIQUE: Multidetector CT imaging of the abdomen and pelvis was performed using the standard protocol following bolus administration of intravenous contrast. CONTRAST:  167mL ISOVUE-300 IOPAMIDOL (ISOVUE-300) INJECTION 61% COMPARISON:  CT the abdomen and pelvis 12/25/2008. FINDINGS: Lower chest: Similar appearance to recent chest CT demonstrating a hypovascular mass in the lower mediastinum, complete atelectasis/consolidation of visualized portions of the right lower lobe, probable central perihilar mass and partial subsegmental atelectasis in the right middle lobe with moderate right pleural effusion. Trace left pleural effusion. Hepatobiliary: No cystic or solid hepatic lesions. No intra or extrahepatic biliary ductal dilatation. Amorphous high attenuation material in the dependent portion of the gallbladder, compatible with biliary sludge and/or vicarious excretion of contrast. No signs of acute cholecystitis are noted at this time. Pancreas: No pancreatic mass. No pancreatic ductal dilatation. No pancreatic or peripancreatic fluid or inflammatory changes. Spleen: Unremarkable. Adrenals/Urinary Tract: 1.2 cm left adrenal nodule is indeterminate. Right adrenal gland is normal in appearance. Exophytic 2.7 cm high attenuation lesion in the upper pole of the left kidney, only slightly larger than remote prior study from 2010, likely a proteinaceous/hemorrhagic cyst. subcentimeter low-attenuation lesion in the lower pole the right kidney is too small to definitively characterize, but is likely to represent tiny cyst. Stomach/Bowel: The appearance of the stomach is normal. There is no pathologic dilatation of small bowel or colon. There is some subjective wall  thickening in the ascending colon, best appreciated on axial images 38-48 of series 2, which could be overestimated secondary to under distention of the colon in this region. Normal appendix. Vascular/Lymphatic: Aortic atherosclerosis, without evidence of aneurysm or dissection in the abdominal or pelvic vasculature. No lymphadenopathy noted in the abdomen or pelvis. Reproductive: Prostate gland and seminal vesicles are unremarkable in appearance. Other: No significant volume of ascites.  No pneumoperitoneum. Musculoskeletal: There are no aggressive appearing lytic or blastic lesions noted in the visualized portions of the skeleton. IMPRESSION: 1. No abscess identified in the abdomen or pelvis. 2. Subjective wall thickening in the region  of the ascending colon concerning for a colitis. 3. Biliary sludge or vicarious excretion of contrast material in the gallbladder. No findings to suggest an acute cholecystitis at this time. 4. 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 5. Aortic atherosclerosis. 6. Additional incidental findings, as above. Aortic Atherosclerosis (ICD10-I70.0). Electronically Signed   By: Vinnie Langton M.D.   On: 09/10/2016 19:13   Dg Chest Port 1 View  Result Date: 09/02/2016 CLINICAL DATA:  Shortness of breath EXAM: PORTABLE CHEST 1 VIEW COMPARISON:  Chest x-ray of 08/31/2016 and 07/04/2016 FINDINGS: The chest x-ray of 07/04/2016 shows a clear right lung base. However the persistence of right basilar volume loss in abnormality the right hilum is worrisome for possible central endobronchial process. Also there may be a small right pleural effusion present. CT of the chest with IV contrast media is recommended to assess further. The left lung is clear. Mild cardiomegaly is stable. No bony abnormality is seen. IMPRESSION: 1. Persistent abnormal opacity at the right lung base with abnormality of the right hilum and infrahilar region.  Cannot exclude a central endobronchial process. Recommend CT of the chest with IV contrast media. 2. Small right pleural effusion. Electronically Signed   By: Ivar Drape M.D.   On: 09/02/2016 09:02   Dg Chest Port 1 View  Result Date: 08/31/2016 CLINICAL DATA:  Acute respiratory distress.  Productive cough. EXAM: PORTABLE CHEST 1 VIEW COMPARISON:  Radiograph 08/23/2016, additional priors FINDINGS: Progressive volume loss in the right lower lobe with increased right basilar opacity. Improved left basilar aeration. Unchanged heart size and mediastinal contours. No pulmonary edema. No pneumothorax. IMPRESSION: Progressive volume loss in the right lower lobe with increased basilar opacity. This may be due to increased pleural effusion or atelectasis. Recommend continued radiographic follow-up to resolution. Improving left basilar aeration. Electronically Signed   By: Jeb Levering M.D.   On: 08/31/2016 23:07   Dg Chest Portable 1 View  Result Date: 08/21/2016 CLINICAL DATA:  Shortness of breath, bilateral lower extremity edema and discoloration. EXAM: PORTABLE CHEST 1 VIEW COMPARISON:  PA and lateral chest x-ray of July 04, 2016 FINDINGS: The left lung is well-expanded and clear. On the right there is increased density at the lung base. The heart is mildly enlarged. The pulmonary vascularity is normal. There is no pleural effusion. There is calcification in the wall of the aortic arch. The bony thorax exhibits no acute abnormality. IMPRESSION: Right lower lobe atelectasis or pneumonia. Probable underlying COPD or reactive airway disease. Followup PA and lateral chest X-ray is recommended in 3-4 weeks following trial of antibiotic therapy to ensure resolution and exclude underlying malignancy. No evidence of pulmonary edema or pleural effusions. Electronically Signed   By: David  Martinique M.D.   On: 08/21/2016 15:33   Dg Foot 2 Views Left  Result Date: 08/21/2016 Please see detailed radiograph report in  office note.  Dg Foot 2 Views Right  Result Date: 08/21/2016 Please see detailed radiograph report in office note.  US Thoracentesis Asp Pleural Space W/img Guide  Result Date: 09/12/2016 INDICATION: Patient with history of metastatic lung cancer, dyspnea, right pleural effusion; request made for diagnostic and therapeutic right thoracentesis. EXAM: ULTRASOUND GUIDED DIAGNOSTIC AND THERAPEUTIC RIGHT THORACENTESIS MEDICATIONS: None. COMPLICATIONS: None immediate. PROCEDURE: An ultrasound guided thoracentesis was thoroughly discussed with the patient and questions answered. The benefits, risks, alternatives and complications were also discussed. The patient understands and wishes to proceed with the procedure. Written consent  was obtained. Ultrasound was performed to localize and mark an adequate pocket of fluid in the right chest. The area was then prepped and draped in the normal sterile fashion. 1% Lidocaine was used for local anesthesia. Under ultrasound guidance a Safe-T-Centesis catheter was introduced. Thoracentesis was performed. The catheter was removed and a dressing applied. FINDINGS: A total of approximately 1 liter of slightly hazy, yellow fluid was removed. Samples were sent to the laboratory as requested by the clinical team. IMPRESSION: Successful ultrasound guided diagnostic and therapeutic right thoracentesis yielding 1 liter of pleural fluid. Follow-up chest film shows no pneumothorax. Read by: Rowe Zakhari, PA-C Electronically Signed   By: Lucrezia Europe M.D.   On: 09/12/2016 13:43    Echo 8/17 Study Conclusions  - Left ventricle: The cavity size was normal. Systolic function was   normal. The estimated ejection fraction was in the range of 60%   to 65%. Wall motion was normal; there were no regional wall   motion abnormalities. - Aortic valve: Transvalvular velocity was within the normal range.   There was no stenosis. There was no regurgitation. - Mitral valve: Transvalvular  velocity was within the normal range.   There was no evidence for stenosis. There was no regurgitation. - Right ventricle: The cavity size was normal. Wall thickness was   normal. Systolic function was normal. - Atrial septum: No defect or patent foramen ovale was identified   by color flow Doppler. - Tricuspid valve: There was no regurgitation.    Discharge Exam: Vitals:   09/14/16 2053 09/15/16 0517  BP: 101/62 (!) 99/54  Pulse: 82 78  Resp: 20 20  Temp: 98.4 F (36.9 C) 98.3 F (36.8 C)  SpO2: 96% 97%   Vitals:   09/14/16 1323 09/14/16 2037 09/14/16 2053 09/15/16 0517  BP: 104/70  101/62 (!) 99/54  Pulse: 74  82 78  Resp: 16  20 20   Temp: 98.2 F (36.8 C)  98.4 F (36.9 C) 98.3 F (36.8 C)  TempSrc: Oral  Oral Oral  SpO2: 99% 94% 96% 97%  Weight:    57.1 kg (125 lb 14.1 oz)  Height:        General: Pt is alert, awake, not in acute distress Cardiovascular: RRR, S1/S2 +, no rubs, no gallops Respiratory: diminished but clear bilaterally, no wheezing, no rhonchi Abdominal: Soft, NT, ND, bowel sounds + Extremities: no edema, no cyanosis, right s/p bka with dry dressing in place     The results of significant diagnostics from this hospitalization (including imaging, microbiology, ancillary and laboratory) are listed below for reference.     Microbiology: Recent Results (from the past 240 hour(s))  Culture, Urine     Status: None   Collection Time: 09/09/16  3:44 PM  Result Value Ref Range Status   Specimen Description URINE, CLEAN CATCH  Final   Special Requests NONE  Final   Culture   Final    NO GROWTH Performed at [facility], [address], [place], [place] [address]    Report Status 09/10/2016 FINAL  Final     Labs: BNP (last 3 results)  Recent Labs  08/21/16 1628 08/22/16 0225  BNP 53.8 315.1*   Basic Metabolic Panel:  Recent Labs Lab 09/09/16 0406 09/10/16 0323 09/11/16 0324 09/14/16 0419  NA 128* 129* 131* 128*  K 4.4 4.5 4.1  3.7  CL 100* 103 104 99*  CO2 23 20* 22 24  GLUCOSE 131* 105* 110* 114*  BUN 9 13  12 10  CREATININE 0.52* 0.53* 0.55* 0.54*  CALCIUM 7.2* 7.0* 7.5* 7.2*   Liver Function Tests: No results for input(s): AST, ALT, ALKPHOS, BILITOT, PROT, ALBUMIN in the last 168 hours. No results for input(s): LIPASE, AMYLASE in the last 168 hours. No results for input(s): AMMONIA in the last 168 hours. CBC:  Recent Labs Lab 09/11/16 0324 09/12/16 0344 09/13/16 0419 09/14/16 0419 09/15/16 0408  WBC 12.2* 9.4 8.7 9.0 6.2  NEUTROABS 10.2*  --   --   --   --   HGB 9.2* 8.8* 8.6* 9.0* 8.5*  HCT 27.5* 25.7* 26.1* 27.2* 25.1*  MCV 86.2 86.0 87.3 84.5 84.5  PLT 138* 131* 128* 135* 121*   Cardiac Enzymes: No results for input(s): CKTOTAL, CKMB, CKMBINDEX, TROPONINI in the last 168 hours. BNP: Invalid input(s): POCBNP CBG: No results for input(s): GLUCAP in the last 168 hours. D-Dimer No results for input(s): DDIMER in the last 72 hours. Hgb A1c No results for input(s): HGBA1C in the last 72 hours. Lipid Profile No results for input(s): CHOL, HDL, LDLCALC, TRIG, CHOLHDL, LDLDIRECT in the last 72 hours. Thyroid function studies No results for input(s): TSH, T4TOTAL, T3FREE, THYROIDAB in the last 72 hours.  Invalid input(s): FREET3 Anemia work up No results for input(s): VITAMINB12, FOLATE, FERRITIN, TIBC, IRON, RETICCTPCT in the last 72 hours. Urinalysis    Component Value Date/Time   COLORURINE YELLOW 09/09/2016 1544   APPEARANCEUR CLEAR 09/09/2016 1544   LABSPEC 1.011 09/09/2016 1544   PHURINE 7.0 09/09/2016 1544   GLUCOSEU NEGATIVE 09/09/2016 1544   HGBUR NEGATIVE 09/09/2016 1544   BILIRUBINUR NEGATIVE 09/09/2016 1544   KETONESUR NEGATIVE 09/09/2016 1544   PROTEINUR NEGATIVE 09/09/2016 1544   NITRITE NEGATIVE 09/09/2016 1544   LEUKOCYTESUR NEGATIVE 09/09/2016 1544   Sepsis Labs Invalid input(s): PROCALCITONIN,  WBC,  LACTICIDVEN Microbiology Recent Results (from the past 240  hour(s))  Culture, Urine     Status: None   Collection Time: 09/09/16  3:44 PM  Result Value Ref Range Status   Specimen Description URINE, CLEAN CATCH  Final   Special Requests NONE  Final   Culture   Final    NO GROWTH Performed at [facility], [place] [address], [place], [place] [address]    Report Status 09/10/2016 FINAL  Final     Time coordinating discharge: 50 minutes  SIGNED:  Dessa Phi, DO [facility] Pager [phone]  If 7PM-7AM, please contact night-coverage [email or web address] Password [email or web address] 09/15/2016, 12:00 PM

## 2016-09-15 NOTE — Progress Notes (Signed)
Pt accepted to [facility]. Authorization [facility]) was started Firday 09/12/16 per admissions- awaiting reply from insurance request.  Pt sleeping- CSW spoke with family re: options. Facility states they cannot accept LOG - pt can private pay 1 month up front with hope to be reimbursed if/when authorization approved. Other option is pt could DC home to await authorization, however admissions advises it is much less likely to receive an approved auth request if pt was to DC home. CSW explained options to family and they will deliberate on what plan will be today if pt ready to DC prior to receiving authorization.   Updated attending.  Sharren Bridge, MSW, LCSW Clinical Social Work 09/15/2016 [phone]

## 2016-09-15 NOTE — Progress Notes (Signed)
[facility] Therapy Treatment Record Phone [phone]   Radiation Therapy was administered to Edward Summers on: 09/15/2016  3:21 PM and was treatment # 7 out of a planned course of 10 treatments.  Radiation Treatment  1). Beam photons with 6-10 energy and Photons 6-10 MeV  2). Brachytherapy None  3). Stereotactic Radiosurgery None  4). Other Radiation None     Trudee Kuster, RT (T)

## 2016-09-15 NOTE — Progress Notes (Signed)
PT Cancellation Note  Patient Details Name: Edward Summers MRN: [id number] DOB: Nov 01, 1940   Cancelled Treatment:     attempted twice to see.  2. Pt wanted to eat his lunch.  2. Going downstairs for Radiation.  Pt plans to D/C to SNF.  Will check back as schedule permits.   Rica Koyanagi  PTA [facility]  Acute  Rehab Pager      [phone]

## 2016-09-16 ENCOUNTER — Ambulatory Visit
Admit: 2016-09-16 | Discharge: 2016-09-16 | Disposition: A | Discharge status: Home / Self Care | Payer: Medicare HMO | Attending: Radiation Oncology | Admitting: Radiation Oncology

## 2016-09-16 LAB — CBC
HCT: 26.4 % — ABNORMAL LOW (ref 39.0–52.0)
Hemoglobin: 8.9 g/dL — ABNORMAL LOW (ref 13.0–17.0)
MCH: 29.1 pg (ref 26.0–34.0)
MCHC: 33.7 g/dL (ref 30.0–36.0)
MCV: 86.3 fL (ref 78.0–100.0)
PLATELETS: 121 10*3/uL — AB (ref 150–400)
RBC: 3.06 MIL/uL — ABNORMAL LOW (ref 4.22–5.81)
RDW: 16 % — AB (ref 11.5–15.5)
WBC: 6 10*3/uL (ref 4.0–10.5)

## 2016-09-16 LAB — BASIC METABOLIC PANEL
Anion gap: 5 (ref 5–15)
BUN: 10 mg/dL (ref 6–20)
CALCIUM: 7 mg/dL — AB (ref 8.9–10.3)
CO2: 24 mmol/L (ref 22–32)
CREATININE: 0.46 mg/dL — AB (ref 0.61–1.24)
Chloride: 100 mmol/L — ABNORMAL LOW (ref 101–111)
GFR calc Af Amer: >60 mL/min (ref >60)
GFR calc non Af Amer: >60 mL/min (ref >60)
GLUCOSE: 135 mg/dL — AB (ref 65–99)
POTASSIUM: 3.4 mmol/L — AB (ref 3.5–5.1)
Sodium: 129 mmol/L — ABNORMAL LOW (ref 135–145)

## 2016-09-16 MED ORDER — POTASSIUM CHLORIDE CRYS ER 20 MEQ PO TBCR
40.0000 meq | EXTENDED_RELEASE_TABLET | Freq: Once | ORAL | Status: AC
Start: 2016-09-16 — End: 2016-09-16
  Administered 2016-09-16: 30 meq via ORAL
  Filled 2016-09-16: qty 2

## 2016-09-16 NOTE — Clinical Social Work Placement (Signed)
Pt discharging today to [facility] to [name] for [facility]. Room # [phone], report [phone] Pt will transport via [place] completed medical necessity form and will arrange transport.  Pt's son [name] aware and agreeable to plan. Thanks staff for care of pt. All information provided to facility via the [place]. [employer] authorization has approved SNF request per facility.  See below for placement details  CLINICAL SOCIAL WORK PLACEMENT  NOTE  Date:  09/16/2016  Patient Details  Name: Edward Summers MRN: [id number] Date of Birth: 1940-04-19  Clinical Social Work is seeking post-discharge placement for this patient at the [place] level of care (*CSW will initial, date and re-position this form in  chart as items are completed):  Yes   Patient/family provided with [place] Work Department's list of facilities offering this level of care within the geographic area requested by the patient (or if unable, by the patient's family).  Yes   Patient/family informed of their freedom to choose among providers that offer the needed level of care, that participate in Medicare, Medicaid or managed care program needed by the patient, have an available bed and are willing to accept the patient.  Yes   Patient/family informed of [facility]'s ownership interest in [facility] and [facility], as well as of the fact that they are under no obligation to receive care at these facilities.  PASRR submitted to EDS on 08/30/16     PASRR number received on 08/30/16     Existing PASRR number confirmed on       FL2 transmitted to all facilities in geographic area requested by pt/family on 08/30/16     FL2 transmitted to all facilities within larger geographic area on       Patient informed that his/her managed care company has contracts with or will negotiate with certain facilities, including the following:        Yes   Patient/family informed of bed offers  received.  Patient chooses bed at [facility]     Physician recommends and patient chooses bed at [facility]    Patient to be transferred to [facility] on 09/16/16.  Patient to be transferred to facility by [facility]     Patient family notified on 09/16/16 of transfer.  Name of family member notified:  Son [name]     PHYSICIAN Please sign FL2     Additional Comment:    _______________________________________________ Nila Nephew, LCSW 09/16/2016, 10:47 AM

## 2016-09-16 NOTE — Progress Notes (Signed)
Attempted to call report to [facility] on hold for 15 min with no response.

## 2016-09-16 NOTE — Progress Notes (Signed)
PT Cancellation Note  Patient Details Name: Edward Summers MRN: [id number] DOB: November 24, 1940   Cancelled Treatment:     pt plans to D/C to SNF after Radiation today.   Nathanial Rancher 09/16/2016, 12:40 PM

## 2016-09-16 NOTE — Progress Notes (Signed)
Second attempt to call [facility] for report placed on holds for at least 10 min. with no response.

## 2016-09-16 NOTE — Progress Notes (Signed)
Ambulance here to transport patient to [facility].

## 2016-09-16 NOTE — Progress Notes (Signed)
Patient remains stable for discharge today. Insurance auth completed. Discharge summary updated for SNF.   Dessa Phi, DO [facility] [email or web address] Password [facility] 09/16/2016, 9:47 AM

## 2016-09-16 NOTE — Progress Notes (Signed)
Dr. Maylene Roes to see patient. Informed of patients rash on back and back of legs.  Night shift RN, [place] stated that after patient had a bath and lotion pt had less itching. Pt states it does not itch at this time

## 2016-09-17 ENCOUNTER — Ambulatory Visit
Admission: RE | Admit: 2016-09-17 | Discharge: 2016-09-17 | Disposition: A | Discharge status: Home / Self Care | Payer: Medicare HMO | Source: Ambulatory Visit | Attending: Radiation Oncology | Admitting: Radiation Oncology

## 2016-09-17 DIAGNOSIS — C3431 Malignant neoplasm of lower lobe, right bronchus or lung: Secondary | ICD-10-CM | POA: Diagnosis not present

## 2016-09-17 DIAGNOSIS — Z79899 Other long term (current) drug therapy: Secondary | ICD-10-CM | POA: Diagnosis not present

## 2016-09-17 DIAGNOSIS — C7931 Secondary malignant neoplasm of brain: Secondary | ICD-10-CM | POA: Diagnosis not present

## 2016-09-17 DIAGNOSIS — Z510 Encounter for antineoplastic radiation therapy: Secondary | ICD-10-CM | POA: Diagnosis present

## 2016-09-18 ENCOUNTER — Encounter: Payer: Self-pay | Admitting: Radiation Oncology

## 2016-09-18 ENCOUNTER — Ambulatory Visit: Payer: Medicare HMO

## 2016-09-18 ENCOUNTER — Ambulatory Visit: Payer: Medicare HMO | Admitting: Cardiology

## 2016-09-18 ENCOUNTER — Institutional Professional Consult (permissible substitution): Payer: Medicare HMO | Admitting: Internal Medicine

## 2016-09-18 ENCOUNTER — Ambulatory Visit
Admission: RE | Admit: 2016-09-18 | Discharge: 2016-09-18 | Disposition: A | Discharge status: Home / Self Care | Payer: Medicare HMO | Source: Ambulatory Visit | Attending: Radiation Oncology | Admitting: Radiation Oncology

## 2016-09-18 ENCOUNTER — Ambulatory Visit: Payer: Medicare HMO | Admitting: Radiation Oncology

## 2016-09-18 DIAGNOSIS — Z510 Encounter for antineoplastic radiation therapy: Secondary | ICD-10-CM | POA: Diagnosis not present

## 2016-09-18 DIAGNOSIS — C7931 Secondary malignant neoplasm of brain: Secondary | ICD-10-CM

## 2016-09-18 DIAGNOSIS — C3431 Malignant neoplasm of lower lobe, right bronchus or lung: Secondary | ICD-10-CM

## 2016-09-18 DIAGNOSIS — C3490 Malignant neoplasm of unspecified part of unspecified bronchus or lung: Secondary | ICD-10-CM

## 2016-09-18 NOTE — Progress Notes (Signed)
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 Patient reported shortness of breath requiring 2L oxygen and coughing clear sputum during treatment. He did feel that his breathing improved somewhat during treatment. He denied dysphagia but did experience fatigue and decreased appetite.  Plan: The patient has completed radiation treatment. The patient will return to radiation oncology clinic for routine followup in one month. I advised him to call or return sooner if he has any questions or concerns related to his recovery or treatment. ________________________________  [name]. [name], M.D.   This document serves as a record of services personally performed by Tyler Pita, MD. It was created on his behalf by Bethann Humble, a trained medical scribe. The creation of this record is based on the scribe's personal observations and the provider's statements to them. This document has been checked and approved by the attending provider.

## 2016-09-18 NOTE — Progress Notes (Signed)
  Radiation Oncology         [phone] ________________________________  Name: Edward Summers MRN: [id number]  Date: 09/18/2016  DOB: 1940/01/27  Chart Note:  I reviewed this patient's most recent findings and wanted to take a minute to document my impression.  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  He may be a good candidate for SRS.   We will pursue 3T SRS protocol MRI and present his case in conference.  He also needs a PET to complete staging:      ________________________________  [name]. [name], M.D.

## 2016-09-19 ENCOUNTER — Ambulatory Visit: Payer: Medicare HMO

## 2016-09-19 ENCOUNTER — Encounter: Payer: Self-pay | Admitting: *Deleted

## 2016-09-19 ENCOUNTER — Telehealth: Payer: Self-pay | Admitting: *Deleted

## 2016-09-19 DIAGNOSIS — C3431 Malignant neoplasm of lower lobe, right bronchus or lung: Secondary | ICD-10-CM

## 2016-09-19 NOTE — Progress Notes (Signed)
I followed up with pathology today. Edward Summers does not have enough tissue to send for molecular testing or PDL 1

## 2016-09-19 NOTE — Telephone Encounter (Signed)
Oncology Nurse Navigator Documentation  Oncology Nurse Navigator Flowsheets 09/19/2016  Navigator Location [facility]  Navigator Encounter Type Telephone/I followed up on Edward Summers and his treatment. He completed radiation yesterday.  I updated Dr. Julien Nordmann and he would like to see patient next week. I [facility] and arranged his transportation on 09/25/16 arrive at 3:30.   Telephone Outgoing Call  Treatment Phase Other  Barriers/Navigation Needs Coordination of Care  Interventions Coordination of Care  Coordination of Care Appts  Acuity Level 2  Acuity Level 2 Assistance expediting appointments  Time Spent with Patient 98

## 2016-09-22 ENCOUNTER — Ambulatory Visit: Payer: Medicare HMO

## 2016-09-24 ENCOUNTER — Telehealth: Payer: Self-pay | Admitting: Radiation Therapy

## 2016-09-24 ENCOUNTER — Institutional Professional Consult (permissible substitution): Payer: Medicare HMO | Admitting: Internal Medicine

## 2016-09-24 NOTE — Telephone Encounter (Signed)
[place] in regards to Mr. Kawai brain MRI scheduled for 9/26. I spoke with Kerri Perches; she works with the transportation set up for the residents there. Kerri Perches has confirmed the [place] brain MRI appointment and has it recorded for his transport.   Mont Dutton R.T. (R) (T) Special Procedures Navigator

## 2016-09-25 ENCOUNTER — Ambulatory Visit (HOSPITAL_BASED_OUTPATIENT_CLINIC_OR_DEPARTMENT_OTHER): Payer: Medicare HMO | Admitting: Internal Medicine

## 2016-09-25 ENCOUNTER — Encounter (HOSPITAL_COMMUNITY): Payer: Medicare HMO

## 2016-09-25 ENCOUNTER — Other Ambulatory Visit (HOSPITAL_BASED_OUTPATIENT_CLINIC_OR_DEPARTMENT_OTHER): Payer: Medicare HMO

## 2016-09-25 ENCOUNTER — Telehealth: Payer: Self-pay | Admitting: Internal Medicine

## 2016-09-25 ENCOUNTER — Encounter: Payer: Self-pay | Admitting: *Deleted

## 2016-09-25 ENCOUNTER — Encounter: Payer: Self-pay | Admitting: Internal Medicine

## 2016-09-25 DIAGNOSIS — J90 Pleural effusion, not elsewhere classified: Secondary | ICD-10-CM

## 2016-09-25 DIAGNOSIS — C771 Secondary and unspecified malignant neoplasm of intrathoracic lymph nodes: Secondary | ICD-10-CM | POA: Diagnosis not present

## 2016-09-25 DIAGNOSIS — R634 Abnormal weight loss: Secondary | ICD-10-CM | POA: Diagnosis not present

## 2016-09-25 DIAGNOSIS — R0609 Other forms of dyspnea: Secondary | ICD-10-CM

## 2016-09-25 DIAGNOSIS — R05 Cough: Secondary | ICD-10-CM

## 2016-09-25 DIAGNOSIS — R911 Solitary pulmonary nodule: Secondary | ICD-10-CM

## 2016-09-25 DIAGNOSIS — C3431 Malignant neoplasm of lower lobe, right bronchus or lung: Secondary | ICD-10-CM

## 2016-09-25 DIAGNOSIS — R5383 Other fatigue: Secondary | ICD-10-CM

## 2016-09-25 DIAGNOSIS — Z89511 Acquired absence of right leg below knee: Secondary | ICD-10-CM | POA: Diagnosis not present

## 2016-09-25 DIAGNOSIS — C3490 Malignant neoplasm of unspecified part of unspecified bronchus or lung: Secondary | ICD-10-CM

## 2016-09-25 LAB — CBC WITH DIFFERENTIAL/PLATELET
BASO%: 0.3 % (ref 0.0–2.0)
Basophils Absolute: 0 10*3/uL (ref 0.0–0.1)
EOS ABS: 0.4 10*3/uL (ref 0.0–0.5)
EOS%: 3.6 % (ref 0.0–7.0)
HEMATOCRIT: 32 % — AB (ref 38.4–49.9)
HGB: 10.5 g/dL — ABNORMAL LOW (ref 13.0–17.1)
LYMPH#: 1.8 10*3/uL (ref 0.9–3.3)
LYMPH%: 17 % (ref 14.0–49.0)
MCH: 28.5 pg (ref 27.2–33.4)
MCHC: 32.8 g/dL (ref 32.0–36.0)
MCV: 87 fL (ref 79.3–98.0)
MONO#: 0.9 10*3/uL (ref 0.1–0.9)
MONO%: 8.6 % (ref 0.0–14.0)
NEUT%: 70.5 % (ref 39.0–75.0)
NEUTROS ABS: 7.3 10*3/uL — AB (ref 1.5–6.5)
NRBC: 1 % — AB (ref 0–0)
Platelets: 143 10*3/uL (ref 140–400)
RBC: 3.68 10*6/uL — ABNORMAL LOW (ref 4.20–5.82)
RDW: 16.8 % — AB (ref 11.0–14.6)
WBC: 10.3 10*3/uL (ref 4.0–10.3)

## 2016-09-25 LAB — COMPREHENSIVE METABOLIC PANEL
ALBUMIN: 1.2 g/dL — AB (ref 3.5–5.0)
ALK PHOS: 89 U/L (ref 40–150)
ALT: 19 U/L (ref 0–55)
AST: 16 U/L (ref 5–34)
Anion Gap: 4 mEq/L (ref 3–11)
BUN: 8.3 mg/dL (ref 7.0–26.0)
CALCIUM: 7.2 mg/dL — AB (ref 8.4–10.4)
CHLORIDE: 99 meq/L (ref 98–109)
CO2: 25 mEq/L (ref 22–29)
Creatinine: 0.6 mg/dL — ABNORMAL LOW (ref 0.7–1.3)
EGFR: >90 mL/min/{1.73_m2} (ref >90)
GLUCOSE: 119 mg/dL (ref 70–140)
POTASSIUM: 4.1 meq/L (ref 3.5–5.1)
SODIUM: 129 meq/L — AB (ref 136–145)
Total Bilirubin: 0.33 mg/dL (ref 0.20–1.20)
Total Protein: 4.3 g/dL — ABNORMAL LOW (ref 6.4–8.3)

## 2016-09-25 NOTE — Telephone Encounter (Signed)
Scheduled appt per 9/20 los - Gave patient AVS and calender per los.

## 2016-09-25 NOTE — Progress Notes (Signed)
[place] [phone]   [phone]  OFFICE PROGRESS NOTE  Jilda Panda, MD [address] [place] [address]  DIAGNOSIS: Stage IV (T3, N2, M1c)  Non-small cell lung cancer presented with large right lower lobe lung mass in addition to mediastinal lymphadenopathy as well as recurrent right pleural effusion, contralateral pulmonary nodule as well as suspicious brain metastasis diagnosed in September 2018  PRIOR THERAPY: palliative radiotherapy to the central obstructing right lower lobe lung mass under the care of Dr. [name].  CURRENT THERAPY:None.  INTERVAL HISTORY: Edward Summers 76 y.o. male returns to the clinic today for  Follow-up visit accompanied by his son. The patient is currently a resident of skilled nursing facility for rehabilitation after his right below knee amputation as well as the recent diagnosis of stage IV non-small cell lung cancer. I saw him during his hospitalization and he had recurrent right pleural effusion. He underwent ultrasound-guided thoracentesis twice recently but the cytology was negative for malignancy. MRI of the brain showed suspicious brain metastasis. He is scheduled for MRI with SRS protocol next month. The patient continues to complain of increasing fatigue and weakness. He denied having any chest pain but continues to have shortness of breath at baseline and increased with exertion with mild cough and no hemoptysis. He lost several pounds recently. He denied having any fever or chills. He has no nausea, vomiting, diarrhea or constipation. He is here today for evaluation and discussion of his treatment options.  MEDICAL HISTORY: Past Medical History:  Diagnosis Date  . Arthritis    "probably; get sore all over" (08/21/2016)  . Dyspnea   . Emphysema of lung ([facility])    "from smoking" (08/21/2016)  . History of gout X 1  . HLD (hyperlipidemia)    [name] 08/21/2016  . Hypertension   . Pneumonia 1970s     ALLERGIES:  is allergic to zithromax [azithromycin].  MEDICATIONS:  Current Outpatient Prescriptions  Medication Sig Dispense Refill  . amiodarone (PACERONE) 200 MG tablet Take 1 tablet (200 mg total) by mouth daily. 28 tablet 0  . apixaban (ELIQUIS) 5 MG TABS tablet Take 1 tablet (5 mg total) by mouth 2 (two) times daily. 60 tablet 0  . atorvastatin (LIPITOR) 40 MG tablet Take 40 mg by mouth at bedtime.      No current facility-administered medications for this visit.     SURGICAL HISTORY:  Past Surgical History:  Procedure Laterality Date  . AMPUTATION Right 08/28/2016   Procedure: AMPUTATION BELOW KNEE RIGHT;  Surgeon: Waynetta Sandy, MD;  Location: [facility];  Service: Vascular;  Laterality: Right;  . BACK SURGERY    . CATARACT EXTRACTION W/ INTRAOCULAR LENS IMPLANT Right   . EMBOLECTOMY Right 08/21/2016   Procedure: Right Lower Extremity Thromboembolectomy with Injection of Alteplase into Right Anterior Tibial Artery and Right Anterior Peroneal Artery;  Surgeon: Waynetta Sandy, MD;  Location: [facility];  Service: Vascular;  Laterality: Right;  . LOWER EXTREMITY ANGIOGRAM Right 08/21/2016   [name] 08/21/2016  . LOWER EXTREMITY ANGIOGRAM Right 08/21/2016   Procedure: LOWER EXTREMITY ANGIOGRAM;  Surgeon: Waynetta Sandy, MD;  Location: [facility];  Service: Vascular;  Laterality: Right;  . LOWER EXTREMITY ANGIOGRAPHY N/A 08/25/2016   Procedure: Lower Extremity Angiography;  Surgeon: Angelia Mould, MD;  Location: [facility];  Service: Cardiovascular;  Laterality: N/A;  . LUMBAR DISC SURGERY     Dr. Arnoldo Morale  . THROMBECTOMY Right 08/21/2016  Thromboembolectomy of right popliteal, pt and AT arteries/notes 08/21/2016  . TONSILLECTOMY    . VIDEO BRONCHOSCOPY Bilateral 09/05/2016   Procedure: VIDEO BRONCHOSCOPY WITHOUT FLUORO;  Surgeon: Juanito Doom, MD;  Location: [facility];  Service: Cardiopulmonary;  Laterality: Bilateral;    REVIEW OF  SYSTEMS:  Constitutional: positive for fatigue and weight loss Eyes: negative Ears, nose, mouth, throat, and face: negative Respiratory: positive for cough and dyspnea on exertion Cardiovascular: negative Gastrointestinal: negative Genitourinary:negative Integument/breast: negative Hematologic/lymphatic: negative Musculoskeletal:positive for muscle weakness Neurological: negative Behavioral/Psych: negative Endocrine: negative Allergic/Immunologic: negative   PHYSICAL EXAMINATION: General appearance: alert, cooperative, fatigued and no distress Head: Normocephalic, without obvious abnormality, atraumatic Neck: no adenopathy, no JVD, supple, symmetrical, trachea midline and thyroid not enlarged, symmetric, no tenderness/mass/nodules Lymph nodes: Cervical, supraclavicular, and axillary nodes normal. Resp: diminished breath sounds RLL and dullness to percussion RLL Back: symmetric, no curvature. ROM normal. No CVA tenderness. Cardio: regular rate and rhythm, S1, S2 normal, no murmur, click, rub or gallop GI: soft, non-tender; bowel sounds normal; no masses,  no organomegaly Extremities: Right below knee amputation Neurologic: Alert and oriented X 3, normal strength and tone. Normal symmetric reflexes. Normal coordination and gait  ECOG PERFORMANCE STATUS: 2 - Symptomatic, <50% confined to bed  There were no vitals taken for this visit.  LABORATORY DATA: Lab Results  Component Value Date   WBC 10.3 09/25/2016   HGB 10.5 (L) 09/25/2016   HCT 32.0 (L) 09/25/2016   MCV 87.0 09/25/2016   PLT 143 09/25/2016      Chemistry      Component Value Date/Time   NA 129 (L) 09/25/2016 1600   K 4.1 09/25/2016 1600   CL 100 (L) 09/16/2016 0345   CO2 25 09/25/2016 1600   BUN 8.3 09/25/2016 1600   CREATININE 0.6 (L) 09/25/2016 1600      Component Value Date/Time   CALCIUM 7.2 (L) 09/25/2016 1600   ALKPHOS 89 09/25/2016 1600   AST 16 09/25/2016 1600   ALT 19 09/25/2016 1600   BILITOT  0.33 09/25/2016 1600       RADIOGRAPHIC STUDIES: Dg Chest 1 View  Result Date: 09/12/2016 CLINICAL DATA:  Post right thoracentesis EXAM: CHEST 1 VIEW COMPARISON:  09/02/2016 FINDINGS: Small right pleural effusion. No pneumothorax. Cardiomegaly. Right hilar mass again noted. No focal opacity on the left or effusion. IMPRESSION: Small right pleural effusion with stable right hilar mass. No pneumothorax. Electronically Signed   By: Rolm Baptise M.D.   On: 09/12/2016 12:21   Ct Chest Wo Contrast  Result Date: 09/09/2016 CLINICAL DATA:  76 year old male former smoker with history of bilateral lower extremity pain and swelling. Status post right below-the-knee amputation on 08/28/2016 for acute limb ischemia. Multiple lung lesions noted on recent chest CT. Followup study. EXAM: CT CHEST WITHOUT CONTRAST TECHNIQUE: Multidetector CT imaging of the chest was performed following the standard protocol without IV contrast. COMPARISON:  Chest CT 09/03/2016. FINDINGS: Cardiovascular: Heart size is normal. There is no significant pericardial fluid, thickening or pericardial calcification. There is aortic atherosclerosis, as well as atherosclerosis of the great vessels of the mediastinum and the coronary arteries, including calcified atherosclerotic plaque in the left main, left anterior descending, left circumflex and right coronary arteries. Mediastinum/Nodes: Multiple enlarged mediastinal and hilar lymph nodes better demonstrated on the recent contrast enhanced chest CT. The largest lymph nodes on today's examination include a low right paratracheal node measuring up to 2 cm in short axis and right hilar lymph node measuring up to  1.7 cm in short axis. In addition, there is a large invasive mass centered in the subcarinal position, which is poorly evaluated on today's noncontrast CT examination but appears grossly similar in size to the prior study at which point this measured 8.5 x 6.6 cm. This mass exerts significant  mass effect upon adjacent structures, most notably displacing the left atrium anteriorly and distal half of the esophagus to the left side. There is again invasion of the left mainstem bronchus which appears slightly occluded. No axillary lymphadenopathy. Lungs/Pleura: Near complete collapse/consolidation of the right lower lobe (minimal aerated superior segment right lower lobe). Partial collapse and consolidation of the lateral segment of the right middle lobe. Large infiltrative mass centered in the right hilar region involving the central aspects of the right upper, middle and lower lobes, poorly evaluated on today's noncontrast CT examination, but grossly similar to the recent prior study. Patchy multifocal nodular appearing airspace disease again noted in the lungs bilaterally, with the largest nodule in the right upper lobe near the apex (axial image 31 of series 5) measuring 14 x 11 mm with some central cavitation which is new compared to the prior study. Peripheral area of ground-glass attenuation in the left upper lobe new compared to the prior examination, presumably infectious/inflammatory in etiology. 6 mm left upper lobe pulmonary nodule (axial image 63 of series 5), and 8 mm left lower lobe pulmonary nodule (axial image 105 of series 5) appears similar to the prior study. Moderate right pleural effusion, likely malignant, similar to the prior examination. Trace left pleural effusion also unchanged. Upper Abdomen: Amorphous high attenuation material lying dependently in the gallbladder, compatible with biliary sludge. Exophytic 2.5 cm high attenuation lesion extending from the posterior aspect of the upper pole the left kidney, incompletely characterized on today's noncontrast CT examination, but likely to represent a proteinaceous/hemorrhagic cysts. Aortic atherosclerosis. Musculoskeletal: There are no aggressive appearing lytic or blastic lesions noted in the visualized portions of the skeleton.  IMPRESSION: 1. Overall, there has been very little change in the appearance the chest compared to recent contrast enhanced chest CT 09/04/2014. There continues to be a central mass in the right lung with direct invasion of the mediastinum, including endobronchial invasion obstructing the right mainstem bronchus, as detailed above. Further evaluation with bronchoscopy for biopsy and PET-CT is suggested for diagnostic and staging purposes. 2. Slight interval increase in size of cavitary nodule in the apex of the right upper lobe, presumably infectious related to central obstruction. There also at areas of ground-glass attenuation scattered throughout the left upper lobe which are also likely infectious or inflammatory in etiology. 3. Aortic atherosclerosis, in addition to left main and 3 vessel coronary artery disease. Aortic Atherosclerosis (ICD10-I70.0). Electronically Signed   By: Vinnie Langton M.D.   On: 09/09/2016 14:55   Ct Chest W Contrast  Result Date: 09/03/2016 CLINICAL DATA:  Followup of lung nodule. Abnormal chest radiograph, demonstrating possible right hilar soft tissue fullness. EXAM: CT CHEST WITH CONTRAST TECHNIQUE: Multidetector CT imaging of the chest was performed during intravenous contrast administration. CONTRAST:  75mL ISOVUE-300 IOPAMIDOL (ISOVUE-300) INJECTION 61% COMPARISON:  Multiple chest radiographs, most recent 08/25/2016. Chest CT 11/24/2005. FINDINGS: Cardiovascular: Aortic and branch vessel atherosclerosis. Tortuous thoracic aorta. Mild cardiomegaly, without pericardial effusion. Multivessel coronary artery atherosclerosis. No central pulmonary embolism, on this non-dedicated study. Mediastinum/Nodes: Low right jugular/ supraclavicular adenopathy. Example at 11 mm on image 9/ series 3. Necrotic mediastinal adenopathy, with a right paratracheal node measuring 1.9 cm on image 27/series  3. Right hilar adenopathy including at 2.8 cm. Mass-effect upon the esophagus, secondary to  adenopathy. There is a fluid level in the upper esophagus. Lungs/Pleura: Small right and trace left-sided pleural effusions. Mild loculation of right-sided effusion superiorly. Endobronchial obstruction involving the right mainstem bronchus including on image 59/series 4. Central right lower lobe and infrahilar soft tissue mass which is most consistent with primary bronchogenic carcinoma, direct mediastinal invasion, and contiguous adenopathy. In combination, this measures on the order of 8.5 x 6.6 cm on image 78/series 3. Postobstructive pneumonitis throughout the right lower lobe and less so right middle lobes. Right upper lobe patchy airspace and ground-glass opacity is likely related to postobstructive pneumonitis. Left-sided pulmonary nodules, including 8 mm nodule in the left lower lobe on image 91/series 4, similar. A left upper lobe 5 mm nodule on image 50/series 4 is new. Smaller left upper lobe nodules are present on the remote prior. Upper Abdomen: Normal imaged portions of the liver, spleen, stomach, pancreas, gallbladder, right adrenal gland, and right kidney. Left adrenal nodularity is unchanged. An interpolar exophytic left renal lesion measures 2.4 cm and greater than fluid density. 2.1 cm back on 11/24/2005. Probable sebaceous cyst about the anterior left chest wall. Musculoskeletal: Moderate thoracic spondylosis. IMPRESSION: 1. 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 Direct mediastinal invasion, nodal metastasis within the chest/low neck, and endobronchial obstruction. 2. Postobstructive collapse and pneumonitis, as detailed above. 3. Right larger than left pleural effusions with mild loculation involving the right-sided pleural effusion. 4. Upper esophageal fluid level could represent dysmotility or partial obstruction secondary to the mediastinal mass. 5. Coronary artery atherosclerosis. Aortic Atherosclerosis (ICD10-I70.0). 6. Left renal lesion is technically indeterminate  but likely a complex cyst, given presence back in 2007. 7. Left-sided pulmonary nodules, primarily similar and therefore benign. A left upper lobe 5 mm nodule is new and could represent a metastasis. 8. The patient may be predisposed to SVC syndrome. Electronically Signed   By: Abigail Miyamoto M.D.   On: 09/03/2016 16:18   [name] IO Contrast  Result Date: 09/12/2016 CLINICAL DATA:  76 year old male with recently discovered right lung mass with mediastinal invasion. Staging. EXAM: MRI HEAD WITHOUT AND WITH CONTRAST TECHNIQUE: Multiplanar, multiecho pulse sequences of the brain and surrounding structures were obtained without and with intravenous contrast. CONTRAST:  18mL MULTIHANCE GADOBENATE DIMEGLUMINE 529 MG/ML IV SOLN COMPARISON:  No prior brain imaging. FINDINGS: Brain: Only axial postcontrast imaging could be obtained. There is a round 4 mm enhancing lesion in the left inferior temporal gyrus with mild surrounding edema. No significant mass effect. See series 12, image 18. No other No abnormal enhancement identified.  No dural thickening. No restricted diffusion to suggest acute infarction. No midline shift, mass effect, ventriculomegaly, extra-axial collection or acute intracranial hemorrhage. Cervicomedullary junction and pituitary are within normal limits. Scattered nonenhancing nonspecific bilateral cerebral white matter T2 and FLAIR hyperintensity. Patchy T2 hyperintensity in the pons. No cortical encephalomalacia or chronic cerebral blood products identified. The deep gray matter nuclei and cerebellum appear normal. Vascular: Major intracranial vascular flow voids are preserved, the distal left vertebral artery is dominant. Skull and upper cervical spine: Visualized bone marrow signal is within normal limits. Negative visualized cervical spine. Sinuses/Orbits: Postoperative changes to the right globe, otherwise normal orbits soft tissues. Paranasal sinuses are well pneumatized. Other: Mild bilateral  mastoid effusions. Visible internal auditory structures appear normal. Negative scalp soft tissues. IMPRESSION: 1. Positive for early metastatic disease to the brain, with a solitary 4 mm metastasis  in the left inferior temporal gyrus. Mild surrounding edema but no mass effect. 2. No other acute intracranial abnormality. Electronically Signed   By: Genevie Ann M.D.   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 Abdominal pain and fever. Evaluate for potential abscess. EXAM: CT ABDOMEN AND PELVIS WITH CONTRAST TECHNIQUE: Multidetector CT imaging of the abdomen and pelvis was performed using the standard protocol following bolus administration of intravenous contrast. CONTRAST:  122mL ISOVUE-300 IOPAMIDOL (ISOVUE-300) INJECTION 61% COMPARISON:  CT the abdomen and pelvis 12/25/2008. FINDINGS: Lower chest: Similar appearance to recent chest CT demonstrating a hypovascular mass in the lower mediastinum, complete atelectasis/consolidation of visualized portions of the right lower lobe, probable central perihilar mass and partial subsegmental atelectasis in the right middle lobe with moderate right pleural effusion. Trace left pleural effusion. Hepatobiliary: No cystic or solid hepatic lesions. No intra or extrahepatic biliary ductal dilatation. Amorphous high attenuation material in the dependent portion of the gallbladder, compatible with biliary sludge and/or vicarious excretion of contrast. No signs of acute cholecystitis are noted at this time. Pancreas: No pancreatic mass. No pancreatic ductal dilatation. No pancreatic or peripancreatic fluid or inflammatory changes. Spleen: Unremarkable. Adrenals/Urinary Tract: 1.2 cm left adrenal nodule is indeterminate. Right adrenal gland is normal in appearance. Exophytic 2.7 cm high attenuation lesion in the upper pole of the left kidney, only slightly larger than remote  prior study from 2010, likely a proteinaceous/hemorrhagic cyst. subcentimeter low-attenuation lesion in the lower pole the right kidney is too small to definitively characterize, but is likely to represent tiny cyst. Stomach/Bowel: The appearance of the stomach is normal. There is no pathologic dilatation of small bowel or colon. There is some subjective wall thickening in the ascending colon, best appreciated on axial images 38-48 of series 2, which could be overestimated secondary to under distention of the colon in this region. Normal appendix. Vascular/Lymphatic: Aortic atherosclerosis, without evidence of aneurysm or dissection in the abdominal or pelvic vasculature. No lymphadenopathy noted in the abdomen or pelvis. Reproductive: Prostate gland and seminal vesicles are unremarkable in appearance. Other: No significant volume of ascites.  No pneumoperitoneum. Musculoskeletal: There are no aggressive appearing lytic or blastic lesions noted in the visualized portions of the skeleton. IMPRESSION: 1. No abscess identified in the abdomen or pelvis. 2. Subjective wall thickening in the region of the ascending colon concerning for a colitis. 3. Biliary sludge or vicarious excretion of contrast material in the gallbladder. No findings to suggest an acute cholecystitis at this time. 4. 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 5. Aortic atherosclerosis. 6. Additional incidental findings, as above. Aortic Atherosclerosis (ICD10-I70.0). Electronically Signed   By: Vinnie Langton M.D.   On: 09/10/2016 19:13   Dg Chest Port 1 View  Result Date: 09/02/2016 CLINICAL DATA:  Shortness of breath EXAM: PORTABLE CHEST 1 VIEW COMPARISON:  Chest x-ray of 08/31/2016 and 07/04/2016 FINDINGS: The chest x-ray of 07/04/2016 shows a clear right lung base. However the persistence of right basilar volume loss in abnormality the right hilum is worrisome for possible central  endobronchial process. Also there may be a small right pleural effusion present. CT of the chest with IV contrast media is recommended to assess further. The left lung is clear. Mild cardiomegaly is stable. No bony abnormality is seen. IMPRESSION: 1. Persistent abnormal opacity at the right lung base with abnormality  of the right hilum and infrahilar region. Cannot exclude a central endobronchial process. Recommend CT of the chest with IV contrast media. 2. Small right pleural effusion. Electronically Signed   By: Ivar Drape M.D.   On: 09/02/2016 09:02   Dg Chest Port 1 View  Result Date: 08/31/2016 CLINICAL DATA:  Acute respiratory distress.  Productive cough. EXAM: PORTABLE CHEST 1 VIEW COMPARISON:  Radiograph 08/23/2016, additional priors FINDINGS: Progressive volume loss in the right lower lobe with increased right basilar opacity. Improved left basilar aeration. Unchanged heart size and mediastinal contours. No pulmonary edema. No pneumothorax. IMPRESSION: Progressive volume loss in the right lower lobe with increased basilar opacity. This may be due to increased pleural effusion or atelectasis. Recommend continued radiographic follow-up to resolution. Improving left basilar aeration. Electronically Signed   By: Jeb Levering M.D.   On: 08/31/2016 23:07   US Thoracentesis Asp Pleural Space W/img Guide  Result Date: 09/12/2016 INDICATION: Patient with history of metastatic lung cancer, dyspnea, right pleural effusion; request made for diagnostic and therapeutic right thoracentesis. EXAM: ULTRASOUND GUIDED DIAGNOSTIC AND THERAPEUTIC RIGHT THORACENTESIS MEDICATIONS: None. COMPLICATIONS: None immediate. PROCEDURE: An ultrasound guided thoracentesis was thoroughly discussed with the patient and questions answered. The benefits, risks, alternatives and complications were also discussed. The patient understands and wishes to proceed with the procedure. Written consent was obtained. Ultrasound was performed  to localize and mark an adequate pocket of fluid in the right chest. The area was then prepped and draped in the normal sterile fashion. 1% Lidocaine was used for local anesthesia. Under ultrasound guidance a Safe-T-Centesis catheter was introduced. Thoracentesis was performed. The catheter was removed and a dressing applied. FINDINGS: A total of approximately 1 liter of slightly hazy, yellow fluid was removed. Samples were sent to the laboratory as requested by the clinical team. IMPRESSION: Successful ultrasound guided diagnostic and therapeutic right thoracentesis yielding 1 liter of pleural fluid. Follow-up chest film shows no pneumothorax. Read by: Rowe Amor, PA-C Electronically Signed   By: Lucrezia Europe M.D.   On: 09/12/2016 13:43    ASSESSMENT AND PLAN:  This is a very pleasant 76 years old white male with a stage IV non-small cell lung cancer, NOS diagnosed in September 2018 and there was insufficient material for immunohistochemical stains or molecular studies presented with large obstructing left lower lobe lung mass in addition to mediastinal lymphadenopathy, contralateral pulmonary nodules as well as recurrent right pleural effusion and suspicious brain metastasis. The patient is feeling very weak and tired at this point. He is currently at a skilled nursing facility. He is undergoing physical therapy. I had a lengthy discussion with the patient and his son about his current condition and treatment options.I explained to the patient that he has incurable condition and on the treatment will be of palliative nature.  I recommended for the patient to complete the staging workup and I ordered a PET scan to be performed next week.  I will arrange for the patient to come back for follow-up visit in 2 weeks for reevaluation and discussion of his treatment options based on the final staging workup and his condition at that time.  He was given the option of palliative care and hospice referral versus  consideration of systemic chemotherapy. The patient is interested in treatment and this will be discussed in more details in the upcoming visit.  He was advised to call immediately if he has any concerning symptoms in the interval. The patient voices understanding of current disease status and  treatment options and is in agreement with the current care plan. All questions were answered. The patient knows to call the clinic with any problems, questions or concerns. We can certainly see the patient much sooner if necessary.  I spent 15 minutes counseling the patient face to face. The total time spent in the appointment was 25 minutes.  Disclaimer: This note was dictated with voice recognition software. Similar sounding words can inadvertently be transcribed and may not be corrected upon review.

## 2016-09-25 NOTE — Progress Notes (Signed)
[facility] Clinical Social Work  Clinical Social Work met with patient/family and Futures trader at [facility] appointment to offer support and assess for psychosocial needs.  Medical oncologist reviewed patient's diagnosis and recommend need for additional tests with patient/family.  Patient was accompanied by his son, Edward Summers.  Mr. Morella is currently residing at [facility] for rehab therapy.  The patient shared he is "trying his best" complete physical therapy, but still feels very weak/tired.  Clinical Social Work briefly discussed Clinical Social Work role and [employer] support programs/services.  Clinical Social Work encouraged patient to call with any additional questions or concerns.   Maryjean Morn, MSW, LCSW, OSW-C Clinical Social Worker [facility] [phone]

## 2016-09-26 ENCOUNTER — Ambulatory Visit (INDEPENDENT_AMBULATORY_CARE_PROVIDER_SITE_OTHER): Payer: Medicare HMO | Admitting: Internal Medicine

## 2016-09-26 ENCOUNTER — Ambulatory Visit (INDEPENDENT_AMBULATORY_CARE_PROVIDER_SITE_OTHER)
Admission: RE | Admit: 2016-09-26 | Discharge: 2016-09-26 | Disposition: A | Discharge status: Home / Self Care | Payer: Medicare HMO | Source: Ambulatory Visit | Attending: Internal Medicine | Admitting: Internal Medicine

## 2016-09-26 ENCOUNTER — Encounter: Payer: Self-pay | Admitting: Vascular Surgery

## 2016-09-26 ENCOUNTER — Encounter: Payer: Self-pay | Admitting: Internal Medicine

## 2016-09-26 ENCOUNTER — Ambulatory Visit (INDEPENDENT_AMBULATORY_CARE_PROVIDER_SITE_OTHER): Payer: Self-pay | Admitting: Vascular Surgery

## 2016-09-26 ENCOUNTER — Telehealth: Payer: Self-pay | Admitting: *Deleted

## 2016-09-26 VITALS — BP 86/59 | HR 69 | Temp 97.8°F | Resp 18 | Ht 67.0 in | Wt 134.0 lb

## 2016-09-26 VITALS — BP 104/60 | HR 72

## 2016-09-26 DIAGNOSIS — R05 Cough: Secondary | ICD-10-CM | POA: Diagnosis not present

## 2016-09-26 DIAGNOSIS — R059 Cough, unspecified: Secondary | ICD-10-CM

## 2016-09-26 DIAGNOSIS — J90 Pleural effusion, not elsewhere classified: Secondary | ICD-10-CM

## 2016-09-26 DIAGNOSIS — I739 Peripheral vascular disease, unspecified: Secondary | ICD-10-CM

## 2016-09-26 NOTE — Progress Notes (Signed)
Subjective:     Patient ID: Edward Summers, male   DOB: June 21, 1940, 76 y.o.   MRN: [id number]  HPI 76yo WM f/u for recent right bka following thromboembolectomy. 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    Review of Systems Feeling weak Edema of bue    Objective:   Physical Exam  aaox3 Mildly labored breathing   1+ edema bue Healing right bka with eschar on medial aspect, no erythema   Assessment/Plan     76 year old white male follows up from recent right BKA and during hospitalization was found to have lung cancer and is subsequently undergone radiation. His staples removed in office today he does have some eschar over the medial aspect. I have him follow up in 1 month for wound check which time we can refer him for consideration of prosthetic if he is healed. He has issues between now and then we can certainly see him sooner.      Brandon C. Donzetta Matters, MD [facility] Office: [phone] Pager: [phone]

## 2016-09-26 NOTE — Progress Notes (Signed)
LMTCB

## 2016-09-26 NOTE — Patient Instructions (Signed)
Pantoprazole (protonix) 40 mg   Take  30-60 min before first meal of the day and Pepcid (famotidine)  20 mg one @  bedtime until return to office - this is the best way to tell whether stomach acid is contributing to your problem.    GERD (REFLUX)  is an extremely common cause of respiratory symptoms just like yours , many times with no obvious heartburn at all.    It can be treated with medication, but also with lifestyle changes including elevation of the head of your bed (ideally with 6 inch  bed blocks),  Smoking cessation, avoidance of late meals, excessive alcohol, and avoid fatty foods, chocolate, peppermint, colas, red wine, and acidic juices such as orange juice.  NO MINT OR MENTHOL PRODUCTS SO NO COUGH DROPS   USE SUGARLESS CANDY INSTEAD (Jolley ranchers or Stover's or Life Savers) or even ice chips will also do - the key is to swallow to prevent all throat clearing. NO OIL BASED VITAMINS - use powdered substitutes.   Please remember to go to the  x-ray department downstairs in the basement  for your tests - we will call you with the results when they are available.      Please schedule a follow up office visit in 4 weeks, sooner if needed

## 2016-09-26 NOTE — Progress Notes (Signed)
Subjective:     Patient ID: Edward Summers, male   DOB: Jan 14, 1940,     MRN: [id number]  HPI   76 yowm quit smoking July 2018 s/p admit:   Admit date: 08/21/2016 Discharge date: 09/16/2016  Admitted From: Home Disposition:  SNF   Recommendations for Outpatient Follow-up:  1. Follow up with PCP in 1 week 2. Follow up with Vascular Surgery in 1 week for staple removal (remove by 9/14) and in 4 weeks for right BKA follow up  3. Follow up with Cardiology A fib clinic, Dr. Rayann Heman will arrange  4. Continue amiodarone 200mg  daily for 4 weeks, then discontinue pending cardiology input 5. Follow up with radiation oncology Dr. [name] and finish radiation course. Currently scheduled daily until 9/17 6. Follow up with Dr. Julien Nordmann at [facility], he will arrange  7. Please obtain BMP/CBC in 1 week  8. Please follow up on the following pending results: cytology results from thoracentesis   Discharge Condition: Stable, improved CODE STATUS: Full  Diet recommendation: Heart healthy   Brief/Interim Summary He has had a very significant and complicated course of hospitalization. In brief, Edward Summers an 76 y.o.malehypertension, hyperlipidemia, emphysema, who was admitted by vascular surgery service for acute left leglimb ischemia on 08/21/2016. He underwent surgical thromboembolectomy of R popliteal PT and AT arteries. He developed new onset A. fib with RVR in PACU. He was started on IV amiodarone, IV heparin (transitioned to Eliquis). Cardiology was consulted as well. He was also treated for COPD exacerbation with Solu-Medrol and breathing treatments. He did convert to normal sinus during hospitalization, and had multiple episodes of A Fib RVR during hospitalization. On 8/19, rapid response was called due to hemoptysis. Eliquis was stopped and hemoptysis stopped. 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 He developed an episode of hemoptysis again, underwent CT chest, which revealed multiple lung masses. Pulmonology was consulted. Patient underwent bronchoscopy on 8/31. Due to right mainstem bronchus obstruction, radiation oncology was consulted and patient underwent emergency radiation treatment to the central mediastinum to help preserve airway patency. Oncology was also consulted. 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 He also underwent right thoracentesis 9/7. Cytology from pleural fluid is pending at time of discharge.   Discharge Diagnoses:  Principal Problem:   Ischemic pain of foot, right Active Problems:   New onset atrial fibrillation (HCC)   Atrial fibrillation with RVR (HCC)   HLD (hyperlipidemia)   Essential hypertension   COPD with acute exacerbation (HCC)   Ischemic foot   Acute respiratory distress   Lung mass   Pressure injury of skin   Primary cancer of right lower lobe of lung (HCC)   Hyponatremia   Adrenal adenoma   Aortic atherosclerosis ([place])   R thoracentesis 09/12/16 x one liters> neg cytology   Seen by Earlie Server 9/20//8  DIAGNOSIS: Stage IV (T3, N2, M1c)  Non-small cell lung cancer presented with large right lower lobe lung mass in addition to mediastinal lymphadenopathy as well as recurrent right pleural effusion, contralateral pulmonary nodule as well as suspicious brain metastasis diagnosed in September 2018  PRIOR THERAPY: palliative radiotherapy to the central obstructing right lower lobe lung mass under the care of Dr. [name].  CURRENT THERAPY: None.   09/26/2016 consultation/[name] re: cough / maint rx = duoneb qid Chief Complaint  Patient presents with  . Pulmonary  Consult    Referred by Dr. Mellody Drown for recurrent chest infection. Pt c/o SOB, cough and diffulty swallowing "for a while"- several months. He states his cough is non prod.   presently no sob at rest /  sleeps 2 pillows and 2lpm  Doe = MMRC3 = can't walk 100 yards even at a slow pace at a flat grade s stopping due to sob   occ coughing fits to point of gag/cough to point of vomiting assoc with dysphagia  No better on advair/ not much change with saba either   No obvious day to day or daytime variability or assoc excess/ purulent sputum or mucus plugs or hemoptysis or cp or chest tightness, subjective wheeze or overt sinus or hb symptoms. No unusual exp hx or h/o childhood pna/ asthma or knowledge of premature birth.  Sleeping ok flat without nocturnal  or early am exacerbation  of respiratory  c/o's or need for noct saba. Also denies any obvious fluctuation of symptoms with weather or environmental changes or other aggravating or alleviating factors except as outlined above   Current Allergies, Complete Past Medical History, Past Surgical History, Family History, and Social History were reviewed in [employer] record.  ROS  The following are not active complaints unless bolded sore throat, dysphagia, dental problems, itching, sneezing,  nasal congestion or disharge of excess mucus or purulent secretions, ear ache,   fever, chills, sweats, unintended wt loss or wt gain, classically pleuritic or exertional cp,  orthopnea pnd or leg swelling, presyncope, palpitations, abdominal pain, anorexia, nausea, vomiting, diarrhea  or change in bowel habits or bladder habits, change in stools or change in urine, dysuria, hematuria,  rash, arthralgias, visual complaints, headache, numbness, weakness or ataxia or problems with walking or coordination,  change in mood/affect or memory.        Current Meds  Medication Sig  . acetaminophen (TYLENOL) 325 MG tablet Take 650 mg by mouth every 6 (six) hours as needed.  Marland Kitchen amiodarone (PACERONE) 200 MG tablet Take 1 tablet (200 mg total) by mouth daily.  Marland Kitchen apixaban (ELIQUIS) 5 MG TABS tablet Take 1 tablet (5 mg total) by mouth 2 (two) times daily.   Marland Kitchen atorvastatin (LIPITOR) 40 MG tablet Take 40 mg by mouth at bedtime.   . senna (SENOKOT) 8.6 MG TABS tablet Take 2 tablets by mouth at bedtime.          Review of Systems     Objective:   Physical Exam   W/c bound elderly wm nad  Wt Readings from Last 3 Encounters:  09/16/16 132 lb 4.4 oz (60 kg)    Vital signs reviewed   - Note on arrival 02 sats  98% on RA    HEENT: nl dentition, turbinates bilaterally, and oropharynx. Nl external ear canals without cough reflex   NECK :  without JVD/Nodes/TM/ nl carotid upstrokes bilaterally   LUNGS: no acc muscle use,  Nl contour chest with decreased bs / dullness R base   CV:  RRR  no s3 or murmur or increase in P2, and 1+ pitting  ext edema   ABD:  soft and nontender with nl inspiratory excursion in the supine position. No bruits or organomegaly appreciated, bowel sounds nl  MS:  S/p R BKA / ext warm without deformities, calf tenderness, cyanosis or clubbing No obvious joint restrictions   SKIN: warm and dry without lesions    NEURO:  alert, approp, nl sensorium with  no motor or cerebellar  deficits apparent.    CXR PA and Lateral:   09/26/2016 :    I personally reviewed images and agree with radiology impression as follows:    1. Interval recurrence of small to moderate-sized right-sided effusion and development of a small left-sided pleural effusion with worsening bibasilar opacities, right greater than left, atelectasis versus infiltrate. 2. Known right perihilar mass is suboptimally evaluated given right-sided pleural effusion.      Assessment:

## 2016-09-26 NOTE — Progress Notes (Signed)
LMTCB for pt's sone Edward Summers

## 2016-09-26 NOTE — Telephone Encounter (Signed)
Oncology Nurse Navigator Documentation  Oncology Nurse Navigator Flowsheets 09/26/2016  Navigator Location [facility]  Navigator Encounter Type Clinic/MDC;Telephone  Telephone Outgoing Call/I spoke with patient and son at thoracic clinic yesterday.  I gave and explained information on lung cancer, resources for support, and next steps.  I called central scheduling today to arrange PET scan. I was unable to reach them but left vm message to be called.    Abnormal Finding Date 09/02/2016  Confirmed Diagnosis Date 09/12/2016  Multidisiplinary Clinic Date 09/25/2016  Patient Visit Type MedOnc  Treatment Phase Pre-Tx/Tx Discussion  Barriers/Navigation Needs Coordination of Care;Education  Education Newly Diagnosed Cancer Education  Interventions Coordination of Care;Education  Coordination of Care Appts  Education Method Verbal;Written  Acuity Level 2  Acuity Level 2 Assistance expediting appointments;Educational needs  Time Spent with Patient 21

## 2016-09-28 ENCOUNTER — Encounter: Payer: Self-pay | Admitting: Internal Medicine

## 2016-09-28 DIAGNOSIS — J90 Pleural effusion, not elsewhere classified: Secondary | ICD-10-CM | POA: Insufficient documentation

## 2016-09-28 NOTE — Assessment & Plan Note (Addendum)
R thoracentesis 09/12/16 x one liters> neg cytology   Already being managed by Dr Earlie Server and I suspect this is malignant but not proven yet/ would consider pleurex if worsens symptomatically and is not responding to rx of the underlying dz

## 2016-09-28 NOTE — Assessment & Plan Note (Addendum)
He has plenty of reasons to cough already being addressed by Dr Earlie Server with really no evidence of active asthma or bronchitis so for now only addition that needs to be added = rx for gerd to see if helps his dysphagia and perhaps element of uacs.  Upper airway cough syndrome (previously labeled PNDS),  is so named because it's frequently impossible to sort out how much is  CR/sinusitis with freq throat clearing (which can be related to primary GERD)   vs  causing  secondary (" extra esophageal")  GERD from wide swings in gastric pressure that occur with throat clearing, often  promoting self use of mint and menthol lozenges that reduce the lower esophageal sphincter tone and exacerbate the problem further in a cyclical fashion.   These are the same pts (now being labeled as having "irritable larynx syndrome" by some cough centers) who not infrequently have a history of having failed to tolerate ace inhibitors,  dry powder inhalers or biphosphonates or report having atypical/extraesophageal reflux symptoms that don't respond to standard doses of PPI  and are easily confused as having aecopd or asthma flares by even experienced allergists/ pulmonologists (myself included).    Will see back in 4 weeks to eval response to rx ?  next   trial of gabapentin for uacs   I had an extended discussion with the patient reviewing all relevant studies completed to date and  lasting 25 minutes of a 40  minute post hosp/ transition of care  visit with pt new to me     re  severe non-specific but potentially very serious refractory respiratory symptoms of uncertain and potentially multiple  etiologies.   Each maintenance medication was reviewed in detail including most importantly the difference between maintenance and prns and under what circumstances the prns are to be triggered using an action plan format that is not reflected in the computer generated alphabetically organized AVS.    Please see AVS for specific  instructions unique to this office visit that I personally wrote and verbalized to the the pt in detail and then reviewed with pt  by my nurse highlighting any changes in therapy/plan of care  recommended at today's visit.

## 2016-09-29 ENCOUNTER — Telehealth: Payer: Self-pay | Admitting: Internal Medicine

## 2016-09-29 ENCOUNTER — Encounter: Payer: Self-pay | Admitting: *Deleted

## 2016-09-29 NOTE — Telephone Encounter (Signed)
Spoke with pt's son [name] (dpr on file), aware of results/recs.  Nothing further needed.

## 2016-09-29 NOTE — Progress Notes (Signed)
Oncology Nurse Navigator Documentation  Oncology Nurse Navigator Flowsheets 09/29/2016  Navigator Location [facility]  Navigator Encounter Type Other/I called central scheduling to get an appt for PET scan.  I then called [facility] to update their transportation dept on appt. I was unable to reach but did leave vm message for them to call me with my name and phone number.   Treatment Phase Pre-Tx/Tx Discussion  Barriers/Navigation Needs Coordination of Care  Interventions Coordination of Care  Coordination of Care Other  Acuity Level 2  Time Spent with Patient 30

## 2016-09-29 NOTE — Telephone Encounter (Signed)
Notes recorded by Tanda Rockers, MD on 09/26/2016 at 2:59 PM EDT Call pt: Reviewed cxr and minimal recurrence of fluid not enough to warrant any change in rx   ATC pt, no answer. Left message for pt to call back.

## 2016-09-29 NOTE — Telephone Encounter (Signed)
Pt's son returning call.

## 2016-09-30 ENCOUNTER — Ambulatory Visit (HOSPITAL_COMMUNITY)
Admission: RE | Admit: 2016-09-30 | Discharge: 2016-09-30 | Disposition: A | Discharge status: Home / Self Care | Payer: Medicare HMO | Source: Ambulatory Visit | Attending: Nurse Practitioner | Admitting: Nurse Practitioner

## 2016-09-30 ENCOUNTER — Encounter (HOSPITAL_COMMUNITY): Payer: Self-pay | Admitting: Nurse Practitioner

## 2016-09-30 ENCOUNTER — Telehealth: Payer: Self-pay | Admitting: *Deleted

## 2016-09-30 VITALS — BP 94/50 | HR 72 | Ht 67.0 in

## 2016-09-30 DIAGNOSIS — J449 Chronic obstructive pulmonary disease, unspecified: Secondary | ICD-10-CM | POA: Diagnosis not present

## 2016-09-30 DIAGNOSIS — Z87891 Personal history of nicotine dependence: Secondary | ICD-10-CM | POA: Diagnosis not present

## 2016-09-30 DIAGNOSIS — I4891 Unspecified atrial fibrillation: Secondary | ICD-10-CM | POA: Diagnosis present

## 2016-09-30 DIAGNOSIS — R918 Other nonspecific abnormal finding of lung field: Secondary | ICD-10-CM | POA: Insufficient documentation

## 2016-09-30 DIAGNOSIS — Z9889 Other specified postprocedural states: Secondary | ICD-10-CM | POA: Insufficient documentation

## 2016-09-30 DIAGNOSIS — Z79899 Other long term (current) drug therapy: Secondary | ICD-10-CM | POA: Diagnosis not present

## 2016-09-30 DIAGNOSIS — I480 Paroxysmal atrial fibrillation: Secondary | ICD-10-CM | POA: Diagnosis not present

## 2016-09-30 NOTE — Telephone Encounter (Signed)
Oncology Nurse Navigator Documentation  Oncology Nurse Navigator Flowsheets 09/30/2016  Navigator Location [facility]  Navigator Encounter Type Telephone/I called [facility] to update them on PET scan appt and pre-procedure instructions. I spoke with Timmothy Sours his nurse and he verbalized understanding of appt and pre-procedure instructions. Per son, I called him as well and updated him on appt.   Telephone Outgoing Call  Treatment Phase Pre-Tx/Tx Discussion  Barriers/Navigation Needs Coordination of Care;Education  Education Other  Interventions Coordination of Care;Education  Coordination of Care Appts;Other  Education Method Verbal  Acuity Level 2  Time Spent with Patient 30

## 2016-10-01 ENCOUNTER — Other Ambulatory Visit: Payer: Medicare HMO

## 2016-10-01 NOTE — Progress Notes (Signed)
Primary Care Physician: Jilda Panda, MD Referring Physician:   [name] is a 76 y.o. male with a h/o afib along with other complicated comorbidity's that is in the afib clinic f/u hospitalization 8/16 thru 9/11 at [facility].  He has had a very significant and complicated course of hospitalization. He was admitted by vascular surgery service for acute left leglimb ischemia on 08/21/2016. He underwent surgical thromboembolectomy of R popliteal PT and AT arteries. He developed new onset A. fib with RVR in PACU. He was started on IV amiodarone, IV heparin (transitioned to Eliquis). Cardiology was consulted as well. He was also treated for COPD exacerbation with Solu-Medrol and breathing treatments. He did convert to normal sinus during hospitalization, and had multiple episodes of A Fib RVR during hospitalization. On 8/19, rapid response was called due to hemoptysis. Eliquis was stopped and hemoptysis stopped. 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 He developed an episode of hemoptysis again, underwent CT chest, which revealed multiple lung masses. Pulmonology was consulted. Patient underwent bronchoscopy on 8/31. Due to right mainstem bronchus obstruction, radiation oncology was consulted and patient underwent emergency radiation treatment to the central mediastinum to help preserve airway patency. Oncology was also consulted. 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 He also underwent right thoracentesis 9/7. Cytology from pleural fluid is pending at time of discharge.   Today , in the afib clinic, pt continues in SNF, and is brought in by son. No complaints today. Per discharge instructions, he is to stop amiodarone 10/9 and this is very clearly marked in the institutions MAR. He is being followed closely by oncology for ongoing  treatment of his CA.He is also followed by Dr. Melvyn Novas for COPD, as well as his other lung issues. Smoking cessation at time of hospitalization.  Today, he denies symptoms of palpitations, chest pain, shortness of breath, orthopnea, PND, lower extremity edema, dizziness, presyncope, syncope, or neurologic sequela. The patient is tolerating medications without difficulties and is otherwise without complaint today.   Past Medical History:  Diagnosis Date  . Arthritis    "probably; get sore all over" (08/21/2016)  . Cancer ([place])   . Dyspnea   . Emphysema of lung ([place])    "from smoking" (08/21/2016)  . History of gout X 1  . HLD (hyperlipidemia)    Edward Summers 08/21/2016  . Hypertension   . Pneumonia 1970s   Past Surgical History:  Procedure Laterality Date  . AMPUTATION Right 08/28/2016   Procedure: AMPUTATION BELOW KNEE RIGHT;  Surgeon: Waynetta Sandy, MD;  Location: [facility];  Service: Vascular;  Laterality: Right;  . BACK SURGERY    . CATARACT EXTRACTION W/ INTRAOCULAR LENS IMPLANT Right   . EMBOLECTOMY Right 08/21/2016   Procedure: Right Lower Extremity Thromboembolectomy with Injection of Alteplase into Right Anterior Tibial Artery and Right Anterior Peroneal Artery;  Surgeon: Waynetta Sandy, MD;  Location: [facility];  Service: Vascular;  Laterality: Right;  . LOWER EXTREMITY ANGIOGRAM Right 08/21/2016   Edward Summers 08/21/2016  . LOWER EXTREMITY ANGIOGRAM Right 08/21/2016   Procedure: LOWER EXTREMITY ANGIOGRAM;  Surgeon: Waynetta Sandy, MD;  Location: [facility];  Service: Vascular;  Laterality: Right;  . LOWER EXTREMITY ANGIOGRAPHY N/A 08/25/2016   Procedure: Lower Extremity Angiography;  Surgeon: Angelia Mould, MD;  Location: [facility];  Service: Cardiovascular;  Laterality: N/A;  . LUMBAR  DISC SURGERY     Dr. Arnoldo Morale  . THROMBECTOMY Right 08/21/2016   Thromboembolectomy of right popliteal, pt and AT arteries/notes 08/21/2016  . TONSILLECTOMY    . VIDEO  BRONCHOSCOPY Bilateral 09/05/2016   Procedure: VIDEO BRONCHOSCOPY WITHOUT FLUORO;  Surgeon: Juanito Doom, MD;  Location: [facility];  Service: Cardiopulmonary;  Laterality: Bilateral;    Current Outpatient Prescriptions  Medication Sig Dispense Refill  . acetaminophen (TYLENOL) 325 MG tablet Take 650 mg by mouth every 6 (six) hours as needed.    Marland Kitchen amiodarone (PACERONE) 200 MG tablet Take 1 tablet (200 mg total) by mouth daily. 28 tablet 0  . apixaban (ELIQUIS) 5 MG TABS tablet Take 1 tablet (5 mg total) by mouth 2 (two) times daily. 60 tablet 0  . atorvastatin (LIPITOR) 40 MG tablet Take 40 mg by mouth at bedtime.     . Guaifenesin (MUCINEX MAXIMUM STRENGTH) 1200 MG TB12 Take by mouth as needed.    Marland Kitchen ipratropium-albuterol (DUONEB) 0.5-2.5 (3) MG/3ML SOLN Take 3 mLs by nebulization every 8 (eight) hours as needed.    . Melatonin 3 MG TABS Take 1 tablet by mouth.    . polyethylene glycol (MIRALAX / GLYCOLAX) packet Take 17 g by mouth daily as needed.    . senna (SENOKOT) 8.6 MG TABS tablet Take 2 tablets by mouth at bedtime.     No current facility-administered medications for this encounter.     Allergies  Allergen Reactions  . Zithromax [Azithromycin] Other (See Comments)    Side pain    Social History   Social History  . Marital status: Divorced    Spouse name: N/A  . Number of children: N/A  . Years of education: N/A   Occupational History  . Not on file.   Social History Main Topics  . Smoking status: Former Smoker    Packs/day: 1.50    Years: 57.00    Types: Cigarettes    Quit date: 07/06/2016  . Smokeless tobacco: Never Used  . Alcohol use No     Comment: 08/21/2016 "quit ~ 40 yr ago"  . Drug use: No  . Sexual activity: Not Currently   Other Topics Concern  . Not on file   Social History Narrative  . No narrative on file    No family history on file.  ROS- All systems are reviewed and negative except as per the HPI above  Physical Exam: Vitals:    09/30/16 1421  BP: (!) 94/50  Pulse: 72  Height: 5\' 7"  (1.702 m)   Wt Readings from Last 3 Encounters:  09/26/16 134 lb (60.8 kg)  09/16/16 132 lb 4.4 oz (60 kg)    Labs: Lab Results  Component Value Date   NA 129 (L) 09/25/2016   K 4.1 09/25/2016   CL 100 (L) 09/16/2016   CO2 25 09/25/2016   GLUCOSE 119 09/25/2016   BUN 8.3 09/25/2016   CREATININE 0.6 (L) 09/25/2016   CALCIUM 7.2 (L) 09/25/2016   MG 1.9 09/03/2016   Lab Results  Component Value Date   INR 1.22 09/09/2016   Lab Results  Component Value Date   CHOL 89 08/22/2016   HDL 21 (L) 08/22/2016   LDLCALC 55 08/22/2016   TRIG 66 08/22/2016     GEN- The patient is well appearing, alert and oriented x 3 today.   Head- normocephalic, atraumatic Eyes-  Sclera clear, conjunctiva pink Ears- hearing intact Oropharynx- clear Neck- supple, no JVP Lymph- no cervical lymphadenopathy Lungs- Clear  to ausculation bilaterally, normal work of breathing, decreased rt lower base Heart- Regular rate and rhythm, no murmurs, rubs or gallops, PMI not laterally displaced GI- soft, NT, ND, + BS Extremities- no clubbing, cyanosis, or edema, RT BKA MS- no significant deformity or atrophy Skin- no rash or lesion Psych- euthymic mood, full affect Neuro- strength and sensation are intact  EKG- NSR, normal EKG, pr int 144 ms, qrs int 84 ms, qtc 466 ms Epic records reviewed    Assessment and Plan: 1.Afib with RVR, new onset in August with hospitalization Per d/c instructions, he is to stop amiodarone as of 10/9 Consider apixaban 5 mg bid, for chadsvasc score of at least 6  2. COPD Per Dr. Shyrl Numbers  3. Lung mass Per oncology   4. Rt BKA Per vascular  F/u in afib clinic in November   Ranya Fiddler C. Miasia Crabtree, [facility] [address] [place], [place] [address] [phone]

## 2016-10-02 ENCOUNTER — Encounter: Payer: Medicare HMO | Admitting: Vascular Surgery

## 2016-10-03 ENCOUNTER — Telehealth: Payer: Self-pay | Admitting: Internal Medicine

## 2016-10-03 NOTE — Telephone Encounter (Signed)
Called whitley at [facility] - aware of appt change per 9/27 sch message - she will let son know

## 2016-10-07 ENCOUNTER — Ambulatory Visit (HOSPITAL_COMMUNITY): Payer: Medicare HMO

## 2016-10-08 ENCOUNTER — Ambulatory Visit: Payer: Medicare HMO | Admitting: Internal Medicine

## 2016-10-08 ENCOUNTER — Other Ambulatory Visit: Payer: Medicare HMO

## 2016-10-10 ENCOUNTER — Encounter: Payer: Self-pay | Admitting: *Deleted

## 2016-10-10 ENCOUNTER — Ambulatory Visit: Admission: RE | Admit: 2016-10-10 | Payer: Medicare HMO | Source: Ambulatory Visit | Admitting: Radiation Oncology

## 2016-10-10 ENCOUNTER — Ambulatory Visit
Admission: RE | Admit: 2016-10-10 | Discharge: 2016-10-10 | Disposition: A | Discharge status: Home / Self Care | Payer: Medicare HMO | Source: Ambulatory Visit | Attending: Radiation Oncology | Admitting: Radiation Oncology

## 2016-10-10 DIAGNOSIS — C3431 Malignant neoplasm of lower lobe, right bronchus or lung: Secondary | ICD-10-CM

## 2016-10-10 DIAGNOSIS — C3490 Malignant neoplasm of unspecified part of unspecified bronchus or lung: Secondary | ICD-10-CM

## 2016-10-10 DIAGNOSIS — C7931 Secondary malignant neoplasm of brain: Secondary | ICD-10-CM

## 2016-10-10 MED ORDER — GADOBENATE DIMEGLUMINE 529 MG/ML IV SOLN
13.0000 mL | Freq: Once | INTRAVENOUS | Status: AC | PRN
  Administered 2016-10-10: 13 mL via INTRAVENOUS

## 2016-10-10 NOTE — Progress Notes (Signed)
Oncology Nurse Navigator Documentation  Oncology Nurse Navigator Flowsheets 10/10/2016  Navigator Location [facility]  Navigator Encounter Type Other/patient had MRI Brain today. I updated Rad Onc PA, Ashlyn and Dr. Julien Nordmann. I also called Dr. Johny Shears nurse with an update, I left her a vm message.   Treatment Phase Pre-Tx/Tx Discussion  Barriers/Navigation Needs Coordination of Care  Interventions Coordination of Care  Coordination of Care Other  Acuity Level 2  Time Spent with Patient 30

## 2016-10-13 ENCOUNTER — Other Ambulatory Visit: Payer: Self-pay | Admitting: Internal Medicine

## 2016-10-13 ENCOUNTER — Ambulatory Visit (HOSPITAL_COMMUNITY): Payer: Medicare HMO

## 2016-10-13 ENCOUNTER — Telehealth: Payer: Self-pay | Admitting: *Deleted

## 2016-10-13 DIAGNOSIS — C3431 Malignant neoplasm of lower lobe, right bronchus or lung: Secondary | ICD-10-CM

## 2016-10-13 MED ORDER — DEXAMETHASONE 4 MG PO TABS: 4.0000 mg | ORAL_TABLET | Freq: Three times a day (TID) | ORAL | 0 refills | Status: AC

## 2016-10-13 MED ORDER — DEXAMETHASONE 4 MG PO TABS: 4.0000 mg | ORAL_TABLET | Freq: Three times a day (TID) | ORAL | 0 refills | Status: DC

## 2016-10-13 NOTE — Telephone Encounter (Signed)
Oncology Nurse Navigator Documentation  Oncology Nurse Navigator Flowsheets 10/13/2016  Navigator Location [facility]  Navigator Encounter Type Telephone/I followed up with Dr. Julien Nordmann regarding Edward Summers scan.  He ordered steroids. I called Edward Summers place to update his nurse. They requested I fax order, I did with confirmation fax received.    Treatment Phase Pre-Tx/Tx Discussion  Barriers/Navigation Needs Coordination of Care  Interventions Coordination of Care  Coordination of Care Other  Acuity Level 2  Time Spent with Patient 45

## 2016-10-14 ENCOUNTER — Encounter: Payer: Self-pay | Admitting: *Deleted

## 2016-10-14 NOTE — Progress Notes (Signed)
Oncology Nurse Navigator Documentation  Oncology Nurse Navigator Flowsheets 10/14/2016  Navigator Location [facility]  Navigator Encounter Type Telephone  Telephone Outgoing Call/I called to follow up on Edward Summers new medication order.  I was unable to reach a nurse and Edward Summers will give them a message to call me.   Treatment Phase Pre-Tx/Tx Discussion  Barriers/Navigation Needs Education  Education Other  Interventions Education  Education Method Verbal  Acuity Level 2  Acuity Level 2 Educational needs  Time Spent with Patient 13

## 2016-10-16 ENCOUNTER — Ambulatory Visit
Admission: RE | Admit: 2016-10-16 | Discharge: 2016-10-16 | Disposition: A | Discharge status: Home / Self Care | Payer: Medicare HMO | Source: Ambulatory Visit | Attending: Urology | Admitting: Urology

## 2016-10-16 ENCOUNTER — Other Ambulatory Visit: Payer: Medicare HMO

## 2016-10-16 ENCOUNTER — Ambulatory Visit: Payer: Medicare HMO

## 2016-10-16 ENCOUNTER — Telehealth: Payer: Self-pay | Admitting: *Deleted

## 2016-10-16 ENCOUNTER — Ambulatory Visit
Admission: RE | Admit: 2016-10-16 | Discharge: 2016-10-16 | Disposition: A | Discharge status: Home / Self Care | Payer: Medicare HMO | Source: Ambulatory Visit | Attending: Radiation Oncology | Admitting: Radiation Oncology

## 2016-10-16 ENCOUNTER — Ambulatory Visit: Admission: RE | Admit: 2016-10-16 | Payer: Medicare HMO | Source: Ambulatory Visit | Admitting: Radiation Oncology

## 2016-10-16 ENCOUNTER — Ambulatory Visit: Payer: Medicare HMO | Admitting: Oncology

## 2016-10-16 NOTE — Telephone Encounter (Signed)
TCT [employer] to inquire about pt's status. Pt was to be here @ [facility] @ 8:30am for labs and appt with Mikey Bussing, NP, then appts in Rad. Onc. Pt did not show up. Left message with nursing director regarding the above with call back # of [phone]

## 2016-10-16 NOTE — Progress Notes (Signed)
Edward Summers 76 y.o. man with right lower lung airway compression at risk for respiratory failure from presumed non-small cell lung cancer completed radiation 09-18-16, review 10-10-16 MRI brain, FU.  Weight changes, if any: Yes ,the patient states that he has loss around 20 pounds. Respiratory complaints, if any: Denies being sob. Hemoptysis, if any:  Denies  Swallowing Problems/Pain/Difficulty swallowing: States that he does have some issues with swallowing. Appetite : States that his appetite is good. Pain: Denies any pain. When is next chemo scheduled?:No Imaging:10-10-16 MRI brain Lab work from of chart:09-07-16 Cmet and CBC w diff Bun 8.3   Creatinine 0.6 EGFR >90 Denies feeling any fatigue. Vitals:   10/20/16 1351  BP: 112/64  Pulse: (!) 58  Resp: 20  Temp: 98 F (36.7 C)  TempSrc: Oral  SpO2: 100%   Wt Readings from Last 3 Encounters:  09/26/16 134 lb (60.8 kg)  09/16/16 132 lb 4.4 oz (60 kg)

## 2016-10-17 ENCOUNTER — Ambulatory Visit: Payer: Medicare HMO | Admitting: Family

## 2016-10-17 ENCOUNTER — Telehealth: Payer: Self-pay | Admitting: Internal Medicine

## 2016-10-17 NOTE — Telephone Encounter (Signed)
Scheduled appt per 10/11 sch message - left message with appt date and time.

## 2016-10-20 ENCOUNTER — Ambulatory Visit
Admission: RE | Admit: 2016-10-20 | Discharge: 2016-10-20 | Disposition: A | Discharge status: Home / Self Care | Payer: Medicare HMO | Source: Ambulatory Visit | Attending: Radiation Oncology | Admitting: Radiation Oncology

## 2016-10-20 ENCOUNTER — Encounter: Payer: Self-pay | Admitting: Radiation Oncology

## 2016-10-20 ENCOUNTER — Ambulatory Visit: Admission: RE | Admit: 2016-10-20 | Payer: Medicare HMO | Source: Ambulatory Visit

## 2016-10-20 VITALS — BP 112/64 | HR 58 | Temp 98.0°F | Resp 20

## 2016-10-20 DIAGNOSIS — C3431 Malignant neoplasm of lower lobe, right bronchus or lung: Secondary | ICD-10-CM

## 2016-10-20 DIAGNOSIS — C7931 Secondary malignant neoplasm of brain: Secondary | ICD-10-CM | POA: Insufficient documentation

## 2016-10-20 DIAGNOSIS — Z510 Encounter for antineoplastic radiation therapy: Secondary | ICD-10-CM | POA: Diagnosis not present

## 2016-10-20 NOTE — Progress Notes (Signed)
Radiation Oncology         [phone] ________________________________  Name: Edward Summers MRN: [id number]  Date of Service: 10/20/2016 DOB: 01-22-1940  Post Treatment Note  CC: Edward Panda, MD  Edward Doom, MD  Diagnosis:   Stage IV, T3N2M1c, NSCLC, NOS of the LLL     Interval Since Last Radiation:  5 weeks   09/06/16-09/18/16: Right lung/ 24 Gy in 8 fractions and 6 Gy in 2 weekend fractions for a total of 30 Gy  Narrative:  The patient returns today for routine follow-up. Edward Summers was originally found to have a large mass in the right lung and underwent bronchoscopy on 09/05/16, his initial washings/brushings revealed non small cell carcinoma of the lung, NOS. He was admitted to the hospital with gangrenous changes of his right lower extremity, and had clinical symptoms of SVC syndrome. He was seen by Dr. Tammi Summers the first weekend in September, and  began therapy as an inpatient in an urgent setting. Upon discharge, he's been in a skilled facility working on rehabilitation. He's scheduled for PET scan this Wednesday. 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 He was offered [facility] planning and went for repeat MRI with 3T SRS protocol on 10/10/16 revealing the solitary lesion measuring 2.2 x 2.1 cm with associated vasogenic edema. He comes today to discuss options for SRS approach which is tentatively scheduled for this Friday.                          On review of systems, the patient states he is doing alright but is note wanting to proceed with any surgical intervention for the brain. He reports his breathing is better. He's frustrated with having too much to do to get better. He is working with the vein and vascular providers to get postoperative checks and possibly for prosthetic fittings. He denies headaches, but his son states he's been having more episodes of confusion in the last two weeks or so.  He is somewhat irritable as well per report. He denies any chest pain or shortness of breath. No other complaints are verbalized.  ALLERGIES:  is allergic to zithromax [azithromycin].  Meds: Current Outpatient Prescriptions  Medication Sig Dispense Refill  . acetaminophen (TYLENOL) 325 MG tablet Take 650 mg by mouth every 6 (six) hours as needed.    Marland Kitchen apixaban (ELIQUIS) 5 MG TABS tablet Take 1 tablet (5 mg total) by mouth 2 (two) times daily. 60 tablet 0  . atorvastatin (LIPITOR) 40 MG tablet Take 40 mg by mouth at bedtime.     . Guaifenesin (MUCINEX MAXIMUM STRENGTH) 1200 MG TB12 Take by mouth as needed.    Marland Kitchen ipratropium-albuterol (DUONEB) 0.5-2.5 (3) MG/3ML SOLN Take 3 mLs by nebulization every 8 (eight) hours as needed.    . Melatonin 3 MG TABS Take 1 tablet by mouth.    . polyethylene glycol (MIRALAX / GLYCOLAX) packet Take 17 g by mouth daily as needed.    . senna (SENOKOT) 8.6 MG TABS tablet Take 2 tablets by mouth at bedtime.    Marland Kitchen amiodarone (PACERONE) 200 MG tablet Take 1 tablet (200 mg total) by mouth daily. 28 tablet 0  . dexamethasone (DECADRON) 4 MG tablet Take 1 tablet (4 mg total) by mouth 3 (three) times daily. (Patient not taking: Reported on 10/20/2016) 30 tablet 0   No current facility-administered  medications for this encounter.     Physical Findings:  oral temperature is 98 F (36.7 C). His blood pressure is 112/64 and his pulse is 58 (abnormal). His respiration is 20 and oxygen saturation is 100%.  Pain Assessment Pain Score: 0-No pain/10 In general this is a chronically ill appearing caucasian male in no acute distress. He's alert and oriented x4 and appropriate throughout the examination. Cardiopulmonary assessment is negative for acute distress and he exhibits normal effort with lungs clear to auscultation bilaterally. No evidence of hypervascularity is noted of his anterior chest wall. He is somewhat disgruntled but no neurologic deficits are seen grossly.  Lab  Findings: Lab Results  Component Value Date   WBC 10.3 09/25/2016   HGB 10.5 (L) 09/25/2016   HCT 32.0 (L) 09/25/2016   MCV 87.0 09/25/2016   PLT 143 09/25/2016     Radiographic Findings: Dg Chest 2 View  Result Date: 09/26/2016 CLINICAL DATA:  Cough, congestion shortness of breath for the past month. History of lung cancer, pneumonia and emphysema. EXAM: CHEST  2 VIEW COMPARISON:  09/12/2016; 09/02/2016; chest CT - 09/09/2016; ultrasound-guided right-sided thoracentesis - 09/11/2016 FINDINGS: Grossly unchanged cardiac silhouette and mediastinal contours with partial obscuration of the right heart border secondary to known perihilar mass. Interval recurrence of small to moderate size right-sided effusion. Interval development of small left-sided pleural effusion. Worsening bibasilar opacities, right greater than left. No definite pneumothorax. No definite evidence of edema. No acute osseus abnormalities. IMPRESSION: 1. Interval recurrence of small to moderate-sized right-sided effusion and development of a small left-sided pleural effusion with worsening bibasilar opacities, right greater than left, atelectasis versus infiltrate. 2. Known right perihilar mass is suboptimally evaluated given right-sided pleural effusion. Electronically Signed   By: Sandi Mariscal M.D.   On: 09/26/2016 14:22   Mr Edward Summers XF Contrast  Addendum Date: 10/14/2016   ADDENDUM REPORT: 10/14/2016 14:58 ADDENDUM: I had misinterpreted clinical history as the patient having been already treated when in fact I am told that is not the case. Therefore, pseudo progression is an impossibility. In that case, we are dealing with a very rapidly progressive metastasis. Other possibilities that I considered included a second process such as brain lymphoma or brain infection, but find those unlikely in this setting with this appearance. Electronically Signed   By: Nelson Chimes M.D.   On: 10/14/2016 14:58   Result Date: 10/14/2016 CLINICAL  DATA:  Followup after initial treatment. New diagnosis lung cancer with left temporal metastasis. EXAM: MRI HEAD WITHOUT AND WITH CONTRAST TECHNIQUE: Multiplanar, multiecho pulse sequences of the brain and surrounding structures were obtained without and with intravenous contrast. CONTRAST:  77mL MULTIHANCE GADOBENATE DIMEGLUMINE 529 MG/ML IV SOLN COMPARISON:  09/12/2016 FINDINGS: Brain: There is a pronounced increase in vasogenic edema throughout the left temporal lobe. Previously seen 4 mm focus of enhancement at the inferior temporal lobe on the left is now AA 2 x 2.5 cm region of indistinct enhancement. This almost certainly represents pseudo progression following treatment. No second brain metastasis is identified. Moderate chronic small-vessel ischemic changes are again seen throughout the brain. No hydrocephalus. No extra-axial fluid collection. Vascular: Major vessels at the base of the brain show flow. Skull and upper cervical spine: Negative Sinuses/Orbits: Mild mucosal inflammation of the paranasal sinuses. New demonstration of bilateral mastoid effusions. Other: None IMPRESSION: Followup exam shows pseudo progression with enlargement of the previously seen 4 mm enhancing focus in the inferolateral left temporal lobe to an indistinct region of enhancement measuring  2 x 2.5 cm. Marked increase in regional vasogenic edema. Electronically Signed: By: Nelson Chimes M.D. On: 10/10/2016 14:09    Impression/Plan: 1.  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 We spent time today discussing options of radiotherapy for the patient's brain disease. He is not currently taking steroids, and we discussed that given his symptoms along with his edema, this would be advised. He will also start taking OTC tums or antacids while taking dexamethasone. We also discussed options of prilosec to avoid acid erosion. He is interested in [facility] alone without surgical resection and we discussed the risks,  benefits, short, and long term effects of treatment as well as the delivery and logistics of treatment. He will return tomorrow at 10:15 am for IV start and simulation at 11 am. His treatment with Dr. Tammi Summers and Dr. Vertell Limber is scheduled for Friday at 4pm. Written consent is obtained and placed in the chart, a copy was provided to the patient.      Carola Rhine, PAC

## 2016-10-21 ENCOUNTER — Ambulatory Visit
Admission: RE | Admit: 2016-10-21 | Discharge: 2016-10-21 | Disposition: A | Discharge status: Home / Self Care | Payer: Medicare HMO | Source: Ambulatory Visit | Attending: Radiation Oncology | Admitting: Radiation Oncology

## 2016-10-21 ENCOUNTER — Ambulatory Visit: Admission: RE | Admit: 2016-10-21 | Payer: Medicare HMO | Source: Ambulatory Visit | Admitting: Radiation Oncology

## 2016-10-21 DIAGNOSIS — C7931 Secondary malignant neoplasm of brain: Secondary | ICD-10-CM

## 2016-10-21 DIAGNOSIS — Z510 Encounter for antineoplastic radiation therapy: Secondary | ICD-10-CM | POA: Diagnosis not present

## 2016-10-21 MED ORDER — SODIUM CHLORIDE 0.9% FLUSH
10.0000 mL | Freq: Once | INTRAVENOUS | Status: AC
  Administered 2016-10-21: 10 mL via INTRAVENOUS

## 2016-10-21 NOTE — Progress Notes (Signed)
  Radiation Oncology         [phone] ________________________________  Name: Edward Summers MRN: [id number]  Date: 10/21/2016  DOB: 09/10/1940  SIMULATION AND TREATMENT PLANNING NOTE    ICD-10-CM   1. 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  Identity was confirmed.  All relevant records and images related to the planned course of therapy were reviewed.  The patient freely provided informed written consent to proceed with treatment after reviewing the details related to the planned course of therapy. The consent form was witnessed and verified by the simulation staff. Intravenous access was established for contrast administration. Then, the patient was set-up in a stable reproducible supine position for radiation therapy.  A relocatable thermoplastic stereotactic head frame was fabricated for precise immobilization.  CT images were obtained.  Surface markings were placed.  The CT images were loaded into the planning software and fused with the patient's targeting MRI scan.  Then the target and avoidance structures were contoured.  Treatment planning then occurred.  The radiation prescription was entered and confirmed.  I have requested 3D planning  I have requested a DVH of the following structures: Brain stem, brain, left eye, right eye, lenses, optic chiasm, target volumes, uninvolved brain, and normal tissue.    SPECIAL TREATMENT PROCEDURE:  The planned course of therapy using radiation constitutes a special treatment procedure. Special care is required in the management of this patient for the following reasons. This treatment constitutes a Special Treatment Procedure for the following reason: High dose per fraction requiring special monitoring for increased toxicities of treatment including daily imaging.  The  special nature of the planned course of radiotherapy will require increased physician supervision and oversight to ensure patient's safety with optimal treatment outcomes.  PLAN:  The patient will receive 18 Gy in 1 fraction.  ________________________________  [name] [name], M.D.

## 2016-10-21 NOTE — Progress Notes (Signed)
Has armband been applied?  Yes.    Does patient have an allergy to IV contrast dye?: No.   Has patient ever received premedication for IV contrast dye?: No.   Does patient take metformin?: No.  If patient does take metformin when was the last dose: n/a    IV site: antecubital right, condition no redness  Has IV site been added to flowsheet?  Yes.

## 2016-10-22 ENCOUNTER — Encounter (HOSPITAL_COMMUNITY)
Admission: RE | Admit: 2016-10-22 | Discharge: 2016-10-22 | Disposition: A | Discharge status: Home / Self Care | Payer: Medicare HMO | Source: Ambulatory Visit | Attending: Internal Medicine | Admitting: Internal Medicine

## 2016-10-22 DIAGNOSIS — C3490 Malignant neoplasm of unspecified part of unspecified bronchus or lung: Secondary | ICD-10-CM | POA: Diagnosis present

## 2016-10-22 DIAGNOSIS — C3431 Malignant neoplasm of lower lobe, right bronchus or lung: Secondary | ICD-10-CM | POA: Diagnosis present

## 2016-10-22 DIAGNOSIS — Z510 Encounter for antineoplastic radiation therapy: Secondary | ICD-10-CM | POA: Diagnosis not present

## 2016-10-22 LAB — GLUCOSE, CAPILLARY: Glucose-Capillary: 97 mg/dL (ref 65–99)

## 2016-10-22 MED ORDER — FLUDEOXYGLUCOSE F - 18 (FDG) INJECTION
6.6600 | Freq: Once | INTRAVENOUS | Status: AC | PRN
  Administered 2016-10-22: 6.66 via INTRAVENOUS

## 2016-10-24 ENCOUNTER — Ambulatory Visit (INDEPENDENT_AMBULATORY_CARE_PROVIDER_SITE_OTHER): Payer: Self-pay | Admitting: Family

## 2016-10-24 ENCOUNTER — Encounter: Payer: Self-pay | Admitting: Radiation Oncology

## 2016-10-24 ENCOUNTER — Ambulatory Visit
Admission: RE | Admit: 2016-10-24 | Discharge: 2016-10-24 | Disposition: A | Discharge status: Home / Self Care | Payer: Medicare HMO | Source: Ambulatory Visit | Attending: Radiation Oncology | Admitting: Radiation Oncology

## 2016-10-24 ENCOUNTER — Encounter: Payer: Self-pay | Admitting: Family

## 2016-10-24 VITALS — BP 99/71 | HR 64 | Temp 97.0°F | Resp 16 | Ht 66.5 in | Wt 143.0 lb

## 2016-10-24 DIAGNOSIS — Z510 Encounter for antineoplastic radiation therapy: Secondary | ICD-10-CM | POA: Diagnosis not present

## 2016-10-24 DIAGNOSIS — C7931 Secondary malignant neoplasm of brain: Secondary | ICD-10-CM

## 2016-10-24 DIAGNOSIS — I779 Disorder of arteries and arterioles, unspecified: Secondary | ICD-10-CM

## 2016-10-24 DIAGNOSIS — Z89511 Acquired absence of right leg below knee: Secondary | ICD-10-CM

## 2016-10-24 NOTE — Progress Notes (Signed)
  Radiation Oncology         [phone] ________________________________  Stereotactic Treatment Procedure Note  Name: Edward Summers MRN: [id number]  Date: 10/24/2016  DOB: [birth year]  SPECIAL TREATMENT PROCEDURE    ICD-10-CM   1. 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  It showed 3-dimensional radiation distributions overlaid onto the planning CT/MRI image set.  The [facility] for the target structures as well as the organs at risk were reviewed. The documentation of the 3D plan and dosimetry are filed in the radiation oncology EMR.  NARRATIVE:  Edward Summers was brought to the TrueBeam stereotactic radiation treatment machine and placed supine on the CT couch. The head frame was applied, and the patient was set up for stereotactic radiosurgery.  Neurosurgery was present for the set-up and delivery  SIMULATION VERIFICATION:  In the couch zero-angle position, the patient underwent Exactrac imaging using the Brainlab system with orthogonal KV images.  These were carefully aligned and repeated to confirm treatment position for each of the isocenters.  The Exactrac snap film verification was repeated at each couch angle.  PROCEDURE: Edward Summers received stereotactic radiosurgery to the following targets: Left temporal 21 mm target was treated using 4 Dynamic Conformal Arcs to a prescription dose of 18 Gy.  ExacTrac registration was performed for each couch angle.  6 MV X-rays were delivered in the flattening filter free beam mode.   STEREOTACTIC TREATMENT MANAGEMENT:  Following delivery, the patient was transported to nursing in stable condition and monitored for possible acute effects.  Vital signs were recorded . The patient tolerated treatment without significant acute effects, and was discharged to home in stable condition.    PLAN:  Follow-up in one month.  ________________________________  Edward Summers. Edward Summers, M.D.

## 2016-10-24 NOTE — Op Note (Signed)
  Name: Edward Summers  MRN: [id number]  Date: 10/24/2016   DOB: 04-12-1940  Stereotactic Radiosurgery Operative Note  PRE-OPERATIVE DIAGNOSIS:  Solitary Brain Metastasis  POST-OPERATIVE DIAGNOSIS:  Solitary Brain Metastasis  PROCEDURE:  Stereotactic Radiosurgery  SURGEON:  Peggyann Shoals, MD  NARRATIVE: The patient underwent a radiation treatment planning session in the radiation oncology simulation suite under the care of the radiation oncology physician and physicist.  I participated closely in the radiation treatment planning afterwards. The patient underwent planning CT which was fused to 3T high resolution MRI with 1 mm axial slices.  These images were fused on the planning system.  We contoured the gross target volumes and subsequently expanded this to yield the Planning Target Volume. I actively participated in the planning process.  I helped to define and review the target contours and also the contours of the optic pathway, eyes, brainstem and selected nearby organs at risk.  All the dose constraints for critical structures were reviewed and compared to AAPM Task Group 101.  The prescription dose conformity was reviewed.  I approved the plan electronically.    Accordingly, [name] was brought to the TrueBeam stereotactic radiation treatment linac and placed in the custom immobilization mask.  The patient was aligned according to the IR fiducial markers with BrainLab Exactrac, then orthogonal x-rays were used in ExacTrac with the 6DOF robotic table and the shifts were made to align the patient  [name] received stereotactic radiosurgery uneventfully.    The detailed description of the procedure is recorded in the radiation oncology procedure note.  I was present for the duration of the procedure.  DISPOSITION:  Following delivery, the patient was transported to nursing in stable condition and monitored for possible acute effects to be discharged to home in stable  condition with follow-up in one month.  Peggyann Shoals, MD 10/24/2016 4:04 PM

## 2016-10-24 NOTE — Progress Notes (Signed)
    Postoperative Visit   History of Present Illness  Edward Summers is a 76 y.o. male who is s/p right BKA following thromboembolectomy on 08-28-16 by Dr. Donzetta Matters. 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    Dr. Donzetta Matters last evaluated pt on 09-26-16. At that time pt had mildly labored breathing   1+ edema bue Healing right bka with eschar on medial aspect, no erythema  His staples removed in office that day; pt had some eschar over the medial aspect. Dr. Donzetta Matters advised follow up in 1 month for wound check which time we can refer him for consideration of prosthetic if he is healed. He has issues between now and then we can certainly see him sooner.  He returns today for evaluation of his right BKA stump.  He is currently a resident of [facility] and [facility], plans to return home after rehab.  He denies any pain, denies fever or chills.   The patient's wounds are healed.  The patient notes pain is well controlled.     For VQI Use Only  PRE-ADM LIVING: Nursing home  AMB STATUS: Wheelchair  Physical Examination  Vitals:   10/24/16 0906  BP: 99/71  Pulse: 64  Resp: 16  Temp: (!) 97 F (36.1 C)  SpO2: 99%  Weight: 143 lb (64.9 kg)  Height: 5' 6.5" (1.689 m)   Body mass index is 22.74 kg/m.  Right BKA incision is well healed with remnant of scab at medial aspect of incision. No swelling, no erythema, no drainage.   Medical Decision Making  Edward Summers is a 76 y.o. male who presents s/p right BKA on 08-28-16. Prescription for [employer] placed with documentation for NH. Start prosthesis fitting in a couple of weeks, when remaining scab has fully granulated.  Return as needed to [facility].   The patient's stump is healing appropriately with resolution of pre-operative symptoms.  Wilton Thrall, Sharmon Leyden, RN, MSN, FNP-C [facility] Office: [phone]  10/24/2016, 9:26 AM  Clinic MD: Donzetta Matters

## 2016-10-27 ENCOUNTER — Ambulatory Visit (INDEPENDENT_AMBULATORY_CARE_PROVIDER_SITE_OTHER): Payer: Medicare HMO | Admitting: Internal Medicine

## 2016-10-27 ENCOUNTER — Encounter: Payer: Self-pay | Admitting: Internal Medicine

## 2016-10-27 VITALS — BP 104/62 | HR 75

## 2016-10-27 DIAGNOSIS — J90 Pleural effusion, not elsewhere classified: Secondary | ICD-10-CM | POA: Diagnosis not present

## 2016-10-27 DIAGNOSIS — R059 Cough, unspecified: Secondary | ICD-10-CM

## 2016-10-27 DIAGNOSIS — R05 Cough: Secondary | ICD-10-CM

## 2016-10-27 DIAGNOSIS — J449 Chronic obstructive pulmonary disease, unspecified: Secondary | ICD-10-CM | POA: Diagnosis not present

## 2016-10-27 NOTE — Assessment & Plan Note (Signed)
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  Can presently lie almost flat s sob so ok to just follow and consider further w/u (repeat tap vs pleurodesis) per Dr Earlie Server and we'll see here prn

## 2016-10-27 NOTE — Assessment & Plan Note (Signed)
Max rx for GERD 09/26/2016 > much better 10/27/2016 > continue indefinitely for now  Discussed the recent press about ppi's in the context of a statistically significant (but questionably clinically relevant) increase in CRI in pts on ppi vs h2's > bottom line is the lowest dose of ppi that controls   gerd is the right dose and if that dose is zero that's fine esp since h2's are cheaper but defer longterm rx to pcp  I had an extended discussion with the patient and son reviewing all relevant studies completed to date and  lasting 15 to 20 minutes of a 25 minute final summary f/u office visit    Each maintenance medication was reviewed in detail including most importantly the difference between maintenance and prns and under what circumstances the prns are to be triggered using an action plan format that is not reflected in the computer generated alphabetically organized AVS.    Please see AVS for specific instructions unique to this visit that I personally wrote and verbalized to the the pt in detail and then reviewed with pt  by my nurse highlighting any  changes in therapy recommended at today's visit to their plan of care.

## 2016-10-27 NOTE — Assessment & Plan Note (Signed)
Quit smoking  07/2016  - Spirometry 10/27/2016  FEV1 1.49 (56%)  Ratio 75 without significant curvature    Although there is emphysema on CT, he does not meet the criteria for copd and can just use the duoneb prn

## 2016-10-27 NOTE — Progress Notes (Signed)
Subjective:     Patient ID: Edward Summers, male   DOB: May 28, 1940      MRN: [id number]    Brief patient profile:  86 yowm quit smoking July 2018 s/p admit:   Admit date: 08/21/2016 Discharge date: 09/16/2016  Admitted From: Home Disposition:  SNF   Recommendations for Outpatient Follow-up:  1. Follow up with PCP in 1 week 2. Follow up with Vascular Surgery in 1 week for staple removal (remove by 9/14) and in 4 weeks for right BKA follow up  3. Follow up with Cardiology A fib clinic, Dr. Rayann Heman will arrange  4. Continue amiodarone 200mg  daily for 4 weeks, then discontinue pending cardiology input 5. Follow up with radiation oncology Dr. [name] and finish radiation course. Currently scheduled daily until 9/17 6. Follow up with Dr. Julien Nordmann at [facility], he will arrange  7. Please obtain BMP/CBC in 1 week  8. Please follow up on the following pending results: cytology results from thoracentesis   Discharge Condition: Stable, improved CODE STATUS: Full  Diet recommendation: Heart healthy   Brief/Interim Summary He has had a very significant and complicated course of hospitalization. In brief, Edward Summers an 75 y.o.malehypertension, hyperlipidemia, emphysema, who was admitted by vascular surgery service for acute left leglimb ischemia on 08/21/2016. He underwent surgical thromboembolectomy of R popliteal PT and AT arteries. He developed new onset A. fib with RVR in PACU. He was started on IV amiodarone, IV heparin (transitioned to Eliquis). Cardiology was consulted as well. He was also treated for COPD exacerbation with Solu-Medrol and breathing treatments. He did convert to normal sinus during hospitalization, and had multiple episodes of A Fib RVR during hospitalization. On 8/19, rapid response was called due to hemoptysis. Eliquis was stopped and hemoptysis stopped. 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 He developed an episode of hemoptysis again, underwent CT chest, which revealed multiple lung masses. Pulmonology was consulted. Patient underwent bronchoscopy on 8/31. Due to right mainstem bronchus obstruction, radiation oncology was consulted and patient underwent emergency radiation treatment to the central mediastinum to help preserve airway patency. Oncology was also consulted. 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 He also underwent right thoracentesis 9/7. Cytology from pleural fluid is pending at time of discharge.   Discharge Diagnoses:  Principal Problem:   Ischemic pain of foot, right Active Problems:   New onset atrial fibrillation (HCC)   Atrial fibrillation with RVR (HCC)   HLD (hyperlipidemia)   Essential hypertension   COPD with acute exacerbation (HCC)   Ischemic foot   Acute respiratory distress   Lung mass   Pressure injury of skin   Primary cancer of right lower lobe of lung (HCC)   Hyponatremia   Adrenal adenoma   Aortic atherosclerosis ([place])   R thoracentesis 09/12/16 x one liters> neg cytology   Seen by Earlie Server 9/20//8  DIAGNOSIS: Stage IV (T3, N2, M1c)  Non-small cell lung cancer presented with large right lower lobe lung mass in addition to mediastinal lymphadenopathy as well as recurrent right pleural effusion, contralateral pulmonary nodule as well as suspicious brain metastasis diagnosed in September 2018  PRIOR THERAPY: palliative radiotherapy to the central obstructing right lower lobe lung mass under the care of Dr. [name].  CURRENT THERAPY: None.   09/26/2016 consultation/Edward Summers re: cough / maint rx = duoneb qid Chief Complaint  Patient presents  with  . Pulmonary Consult    Referred by Dr. Mellody Drown for recurrent chest infection. Pt c/o SOB, cough and diffulty swallowing "for a while"- several months. He states his cough is non prod.   presently  no sob at rest / sleeps 2 pillows and 2lpm  Doe = MMRC3 = can't walk 100 yards even at a slow pace at a flat grade s stopping due to sob   occ coughing fits to point of gag/cough to point of vomiting assoc with dysphagia  No better on advair/ not much change with saba either  rec Pantoprazole (protonix) 40 mg   Take  30-60 min before first meal of the day and Pepcid (famotidine)  20 mg one @  bedtime until return to office - this is the best way to tell whether stomach acid is contributing to your problem.   GERD diet  Please remember to go to the  x-ray department downstairs in the basement  for your tests - we will call you with the results when they are available.     10/27/2016  f/u ov/Edward Summers re:  Cough resolved on gerd rx/  GOLD 0 copd  Chief Complaint  Patient presents with  . Follow-up    Feeling better,sob with exertion occass.   cough is better, swallowing better w/in a week of ger rx  Sleeping on back or sides maybe 10 degrees so sob Just completed rt for brain mets/ no cp or ha   No obvious day to day or daytime variability or assoc excess/ purulent sputum or mucus plugs or hemoptysis or cp or chest tightness, subjective wheeze or overt sinus or hb symptoms. No unusual exp hx or h/o childhood pna/ asthma or knowledge of premature birth.  Sleeping ok almost flat without nocturnal  or early am exacerbation  of respiratory  c/o's or need for noct saba. Also denies any obvious fluctuation of symptoms with weather or environmental changes or other aggravating or alleviating factors except as outlined above   Current Allergies, Complete Past Medical History, Past Surgical History, Family History, and Social History were reviewed in [employer] record.  ROS  The following are not active complaints unless bolded Hoarseness, sore throat, dysphagia minimal now , dental problems, itching, sneezing,  nasal congestion or discharge of excess mucus or purulent secretions,  ear ache,   fever, chills, sweats, unintended wt loss or wt gain, classically pleuritic or exertional cp,  orthopnea pnd or leg swelling, presyncope, palpitations, abdominal pain, anorexia, nausea, vomiting, diarrhea  or change in bowel habits or change in bladder habits, change in stools or change in urine, dysuria, hematuria,  rash, arthralgias, visual complaints, headache, numbness, weakness or ataxia or problems with walking due to R BKA  or coordination,  change in mood/affect or memory.          Current Meds  Medication Sig  . acetaminophen (TYLENOL) 325 MG tablet Take 650 mg by mouth every 6 (six) hours as needed.  Marland Kitchen apixaban (ELIQUIS) 5 MG TABS tablet Take 1 tablet (5 mg total) by mouth 2 (two) times daily.  Marland Kitchen atorvastatin (LIPITOR) 40 MG tablet Take 40 mg by mouth at bedtime.   . collagenase (SANTYL) ointment Apply 1 application topically daily.  . diphenhydrAMINE (BENADRYL) 12.5 MG/5ML liquid Take 12.5 mg by mouth at bedtime as needed.  . famotidine (PEPCID) 20 MG tablet Take 20 mg by mouth at bedtime.  . Guaifenesin (MUCINEX MAXIMUM STRENGTH) 1200 MG TB12 Take by mouth as  needed.  Marland Kitchen ipratropium-albuterol (DUONEB) 0.5-2.5 (3) MG/3ML SOLN Take 3 mLs by nebulization every 8 (eight) hours as needed.  . Melatonin 3 MG TABS Take 1 tablet by mouth.  . Multiple Vitamins-Minerals (DECUBI-VITE) CAPS Take 500 capsules by mouth daily.  . pantoprazole (PROTONIX) 40 MG tablet Take 40 mg by mouth daily.  . polyethylene glycol (MIRALAX / GLYCOLAX) packet Take 17 g by mouth daily as needed.  . saccharomyces boulardii (FLORASTOR) 250 MG capsule Take 250 mg by mouth daily.  Marland Kitchen senna (SENOKOT) 8.6 MG TABS tablet Take 2 tablets by mouth at bedtime.                    Objective:   Physical Exam   W/c bound elderly wm nad   10/27/2016    Not ablle to weigh   09/16/16 132 lb 4.4 oz (60 kg)    Vital signs reviewed   - Note on arrival 02 sats  98% on RA    HEENT: nl dentition, turbinates  bilaterally, and oropharynx. Nl external ear canals without cough reflex   NECK :  without JVD/Nodes/TM/ nl carotid upstrokes bilaterally   LUNGS: no acc muscle use,  Nl contour chest with decreased bs / minimal  dullness R base   CV:  RRR  no s3 or murmur or increase in P2, and trace to 1 + pitting  ext edema s/p bka on R   ABD:  soft and nontender with nl inspiratory excursion in the supine position. No bruits or organomegaly appreciated, bowel sounds nl  MS:  S/p R BKA / ext warm without deformities, calf tenderness, cyanosis or clubbing No obvious joint restrictions   SKIN: warm and dry without lesions    NEURO:  alert, approp, nl sensorium with  no motor or cerebellar deficits apparent.       I personally reviewed images and agree with radiology impression as follows:   Chest CT PET 10/22/16 1. Significant size regression of mediastinal invasive central lower right lung mass, which is largely necrotic with mild residual hypermetabolism, compatible with significant radiation therapy response. Central right lung airways are now patent. Moderate dependent right pleural effusion is increased. 2. Hypermetabolic bilateral mediastinal and right supraclavicular nodal metastases. 3. Interval growth of hypermetabolic bilateral pulmonary metastases. 4. Multiple small hypermetabolic soft tissue metastases scattered throughout the body including at the posterior margin of the left parotid gland, posterior to the left shoulder, deep to the lower right ribs, ventral to the left lower paraspinal musculature, in the medial ventral left abdominal wall, in the deep subcutaneous right gluteal region and in the posterior proximal right thigh musculature. 5. Hypermetabolic left temporal lobe brain metastasis. 6. Aortic Atherosclerosis (ICD10-I70.0) and Emphysema (ICD10-J43.9).       Assessment:

## 2016-10-27 NOTE — Patient Instructions (Addendum)
You do not have significant copd and unlikely you ever will as long as you maintain off cigarettes  Duoneb can be used just on as as needed basis up to 4 x daily but no need to use it on a scheduled basis   Follow up for the fluid in the Right pleural space is Per Dr Earlie Server and is likely related to your tumor in Right lung    If you are satisfied with your treatment plan,  let your doctor know and he/she can either refill your medications or you can return here when your prescription runs out.     If in any way you are not 100% satisfied,  please tell us.  If 100% better, tell your friends!  Pulmonary follow up is as needed

## 2016-10-28 ENCOUNTER — Ambulatory Visit: Payer: Self-pay | Admitting: Urology

## 2016-10-28 ENCOUNTER — Other Ambulatory Visit (HOSPITAL_BASED_OUTPATIENT_CLINIC_OR_DEPARTMENT_OTHER): Payer: Medicare HMO

## 2016-10-28 ENCOUNTER — Encounter: Payer: Self-pay | Admitting: Oncology

## 2016-10-28 ENCOUNTER — Ambulatory Visit (HOSPITAL_BASED_OUTPATIENT_CLINIC_OR_DEPARTMENT_OTHER): Payer: Medicare HMO | Admitting: Oncology

## 2016-10-28 VITALS — BP 110/56 | HR 70 | Temp 97.9°F | Resp 16 | Ht 66.5 in

## 2016-10-28 DIAGNOSIS — C7931 Secondary malignant neoplasm of brain: Secondary | ICD-10-CM

## 2016-10-28 DIAGNOSIS — C7951 Secondary malignant neoplasm of bone: Secondary | ICD-10-CM | POA: Diagnosis not present

## 2016-10-28 DIAGNOSIS — C3431 Malignant neoplasm of lower lobe, right bronchus or lung: Secondary | ICD-10-CM

## 2016-10-28 DIAGNOSIS — C3432 Malignant neoplasm of lower lobe, left bronchus or lung: Secondary | ICD-10-CM | POA: Diagnosis not present

## 2016-10-28 DIAGNOSIS — Z89511 Acquired absence of right leg below knee: Secondary | ICD-10-CM | POA: Diagnosis not present

## 2016-10-28 DIAGNOSIS — C778 Secondary and unspecified malignant neoplasm of lymph nodes of multiple regions: Secondary | ICD-10-CM

## 2016-10-28 DIAGNOSIS — R531 Weakness: Secondary | ICD-10-CM | POA: Diagnosis not present

## 2016-10-28 DIAGNOSIS — R5383 Other fatigue: Secondary | ICD-10-CM | POA: Diagnosis not present

## 2016-10-28 DIAGNOSIS — C3490 Malignant neoplasm of unspecified part of unspecified bronchus or lung: Secondary | ICD-10-CM

## 2016-10-28 DIAGNOSIS — J90 Pleural effusion, not elsewhere classified: Secondary | ICD-10-CM

## 2016-10-28 LAB — CBC WITH DIFFERENTIAL/PLATELET
BASO%: 0.6 % (ref 0.0–2.0)
BASOS ABS: 0.1 10*3/uL (ref 0.0–0.1)
EOS ABS: 0 10*3/uL (ref 0.0–0.5)
EOS%: 0.2 % (ref 0.0–7.0)
HEMATOCRIT: 34.6 % — AB (ref 38.4–49.9)
HEMOGLOBIN: 11.2 g/dL — AB (ref 13.0–17.1)
LYMPH#: 0.4 10*3/uL — AB (ref 0.9–3.3)
LYMPH%: 4.5 % — ABNORMAL LOW (ref 14.0–49.0)
MCH: 28.8 pg (ref 27.2–33.4)
MCHC: 32.4 g/dL (ref 32.0–36.0)
MCV: 88.7 fL (ref 79.3–98.0)
MONO#: 0.5 10*3/uL (ref 0.1–0.9)
MONO%: 4.9 % (ref 0.0–14.0)
NEUT#: 8.5 10*3/uL — ABNORMAL HIGH (ref 1.5–6.5)
NEUT%: 89.8 % — ABNORMAL HIGH (ref 39.0–75.0)
Platelets: 100 10*3/uL — ABNORMAL LOW (ref 140–400)
RBC: 3.9 10*6/uL — ABNORMAL LOW (ref 4.20–5.82)
RDW: 21 % — AB (ref 11.0–14.6)
WBC: 9.4 10*3/uL (ref 4.0–10.3)

## 2016-10-28 LAB — COMPREHENSIVE METABOLIC PANEL
ALBUMIN: 1.8 g/dL — AB (ref 3.5–5.0)
ALK PHOS: 67 U/L (ref 40–150)
ALT: 22 U/L (ref 0–55)
AST: 13 U/L (ref 5–34)
Anion Gap: 6 mEq/L (ref 3–11)
BILIRUBIN TOTAL: 0.23 mg/dL (ref 0.20–1.20)
BUN: 9 mg/dL (ref 7.0–26.0)
CALCIUM: 7.3 mg/dL — AB (ref 8.4–10.4)
CO2: 23 mEq/L (ref 22–29)
Chloride: 105 mEq/L (ref 98–109)
Creatinine: 0.6 mg/dL — ABNORMAL LOW (ref 0.7–1.3)
EGFR: >60 mL/min/{1.73_m2} (ref >60)
Glucose: 140 mg/dl (ref 70–140)
POTASSIUM: 4 meq/L (ref 3.5–5.1)
Sodium: 133 mEq/L — ABNORMAL LOW (ref 136–145)
Total Protein: 4.1 g/dL — ABNORMAL LOW (ref 6.4–8.3)

## 2016-10-28 NOTE — Assessment & Plan Note (Signed)
This is a very pleasant 76 year old white male with a stage IV non-small cell lung cancer, NOS diagnosed in September 2018 and there was insufficient material for immunohistochemical stains or molecular studies presented with large obstructing left lower lobe lung mass in addition to mediastinal lymphadenopathy, contralateral pulmonary nodules as well as recurrent right pleural effusion and suspicious brain metastasis. The patient is feeling very weak and tired at this point, but is trying to gain back his strength. He is currently at a skilled nursing facility and will be discharged later this week.  The patient was seen with Dr. Julien Nordmann. PET scan results were reviewed with the patient and his son. Discussed that there was improvement in the right lung mass, but he continues to have cancer elsewhere in his body. We discussed options including referral to palliative care/hospice versus systemic treatment with chemotherapy consisting of carboplatin with paclitaxel given every 3 weeks. Discussed with the patient adverse effects of this treatment including but not limited to worsening of his fatigue and weakness , alopecia, myelosuppression, nausea and vomiting. Discussed with the patient and his son that he has incurable condition and on the treatment will be of palliative nature.  The patient is unsure of how he wishes to proceed. He would like to think about this some more. We encouraged him not to wait too long before making a decision due to likely further progression of disease. The patient plans to call us when he makes a decision. We will schedule follow-up appointments accordingly once he makes his decision.  All questions were answered. The patient knows to call the clinic with any problems, questions or concerns. We can certainly see the patient much sooner if necessary.

## 2016-10-28 NOTE — Progress Notes (Signed)
[facility] Follow up:    Edward Panda, MD [address] [place] [address]   DIAGNOSIS: Stage IV (T3, N2, M1c)  Non-small cell lung cancer presented with large right lower lobe lung mass in addition to mediastinal lymphadenopathy as well as recurrent right pleural effusion, contralateral pulmonary nodule as well as suspicious brain metastasis diagnosed in September 2018  SUMMARY OF ONCOLOGIC HISTORY:  No history exists.   PRIOR THERAPY:  1) palliative radiotherapy to the central obstructing right lower lobe lung mass under the care of Dr. [name]. 2) SRS to a solitary brain lesion in the right frontal lobe 10/24/2016  CURRENT THERAPY: Observation  INTERVAL HISTORY: Edward Summers 76 y.o. male returns for routine follow-up with his son. The patient is feeling fine today except for fatigue. He continues to reside at a skilled nursing facility is due to be discharged home later this week. The patient reports that his breathing is better since completing radiation. He denies fevers and chills. Denies chest pain, cough, hemoptysis. Denies nausea, vomiting, constipation, diarrhea. The patient's son notes that his father's memory is worse. The patient continues to work with PT and OT and is able to walk very short distances. The patient is here to discuss his recent PET scan results and treatment options.   Patient Active Problem List   Diagnosis Date Noted  . Brain metastasis ([place]) 10/20/2016  . Pleural effusion on right 09/28/2016  . Cough 09/26/2016  . Hyponatremia 09/15/2016  . Adrenal adenoma 09/15/2016  . Aortic atherosclerosis ([place]) 09/15/2016  . Primary cancer of right lower lobe of lung ([place]) 09/10/2016  . Pressure injury of skin 09/07/2016  . Acute respiratory distress   . Lung mass   . New onset atrial fibrillation ([place]) 08/22/2016  . Atrial fibrillation with RVR ([place]) 08/22/2016  . HLD (hyperlipidemia) 08/22/2016  . Essential hypertension  08/22/2016  . COPD  GOLD 0 08/22/2016  . Ischemic foot   . Leg swelling 08/21/2016  . Ischemic pain of foot, right 08/21/2016    is allergic to zithromax [azithromycin].  MEDICAL HISTORY: Past Medical History:  Diagnosis Date  . Arthritis    "probably; get sore all over" (08/21/2016)  . Cancer ([place])   . Dyspnea   . Emphysema of lung ([place])    "from smoking" (08/21/2016)  . History of gout X 1  . HLD (hyperlipidemia)    Edward Summers 08/21/2016  . Hypertension   . Pneumonia 1970s    SURGICAL HISTORY: Past Surgical History:  Procedure Laterality Date  . AMPUTATION Right 08/28/2016   Procedure: AMPUTATION BELOW KNEE RIGHT;  Surgeon: Waynetta Sandy, MD;  Location: [facility];  Service: Vascular;  Laterality: Right;  . BACK SURGERY    . CATARACT EXTRACTION W/ INTRAOCULAR LENS IMPLANT Right   . EMBOLECTOMY Right 08/21/2016   Procedure: Right Lower Extremity Thromboembolectomy with Injection of Alteplase into Right Anterior Tibial Artery and Right Anterior Peroneal Artery;  Surgeon: Waynetta Sandy, MD;  Location: [facility];  Service: Vascular;  Laterality: Right;  . LOWER EXTREMITY ANGIOGRAM Right 08/21/2016   Edward Summers 08/21/2016  . LOWER EXTREMITY ANGIOGRAM Right 08/21/2016   Procedure: LOWER EXTREMITY ANGIOGRAM;  Surgeon: Waynetta Sandy, MD;  Location: [facility];  Service: Vascular;  Laterality: Right;  . LOWER EXTREMITY ANGIOGRAPHY N/A 08/25/2016   Procedure: Lower Extremity Angiography;  Surgeon: Angelia Mould, MD;  Location: [facility];  Service: Cardiovascular;  Laterality: N/A;  . LUMBAR DISC SURGERY  Dr. Arnoldo Morale  . THROMBECTOMY Right 08/21/2016   Thromboembolectomy of right popliteal, pt and AT arteries/notes 08/21/2016  . TONSILLECTOMY    . VIDEO BRONCHOSCOPY Bilateral 09/05/2016   Procedure: VIDEO BRONCHOSCOPY WITHOUT FLUORO;  Surgeon: Juanito Doom, MD;  Location: [facility];  Service: Cardiopulmonary;  Laterality: Bilateral;    SOCIAL  HISTORY: Social History   Social History  . Marital status: Divorced    Spouse name: N/A  . Number of children: N/A  . Years of education: N/A   Occupational History  . Not on file.   Social History Main Topics  . Smoking status: Former Smoker    Packs/day: 1.50    Years: 57.00    Types: Cigarettes    Quit date: 07/06/2016  . Smokeless tobacco: Never Used  . Alcohol use No     Comment: 08/21/2016 "quit ~ 40 yr ago"  . Drug use: No  . Sexual activity: Not Currently   Other Topics Concern  . Not on file   Social History Narrative  . No narrative on file    FAMILY HISTORY: No family history on file.  Review of Systems  Constitutional: Positive for fatigue. Negative for appetite change, chills, fever and unexpected weight change.  HENT:  Negative.   Respiratory: Negative.   Cardiovascular: Negative.   Gastrointestinal: Negative.   Genitourinary: Negative.    Musculoskeletal: Negative.   Skin: Negative.   Neurological: Negative.   Hematological: Negative.   Psychiatric/Behavioral: Negative.       PHYSICAL EXAMINATION  ECOG PERFORMANCE STATUS: 2 - Symptomatic, <50% confined to bed  Vitals:   10/28/16 1405  BP: (!) 110/56  Pulse: 70  Resp: 16  Temp: 97.9 F (36.6 C)  SpO2: 100%    Physical Exam  Constitutional: He is oriented to person, place, and time and well-developed, well-nourished, and in no distress. No distress.  HENT:  Head: Normocephalic and atraumatic.  Mouth/Throat: Oropharynx is clear and moist. No oropharyngeal exudate.  Eyes: Conjunctivae are normal. Right eye exhibits no discharge. Left eye exhibits no discharge. No scleral icterus.  Neck: Normal range of motion. Neck supple.  Cardiovascular: Normal rate, regular rhythm, normal heart sounds and intact distal pulses.   Pulmonary/Chest: Effort normal. No respiratory distress. He has no wheezes. He has no rales.  Abdominal: Soft. Bowel sounds are normal. He exhibits no distension and no mass.  There is no tenderness.  Musculoskeletal: Normal range of motion.  Right below the knee amputation. Left lower extremity with no edema.  Lymphadenopathy:    He has no cervical adenopathy.  Neurological: He is alert and oriented to person, place, and time. He exhibits normal muscle tone. Coordination normal.  Skin: Skin is warm and dry. No rash noted. He is not diaphoretic. No erythema. No pallor.  Psychiatric: Mood, memory, affect and judgment normal.  Vitals reviewed.   LABORATORY DATA:  CBC    Component Value Date/Time   WBC 9.4 10/28/2016 1331   WBC 6.0 09/16/2016 0345   RBC 3.90 (L) 10/28/2016 1331   RBC 3.06 (L) 09/16/2016 0345   HGB 11.2 (L) 10/28/2016 1331   HCT 34.6 (L) 10/28/2016 1331   PLT 100 (L) 10/28/2016 1331   MCV 88.7 10/28/2016 1331   MCH 28.8 10/28/2016 1331   MCH 29.1 09/16/2016 0345   MCHC 32.4 10/28/2016 1331   MCHC 33.7 09/16/2016 0345   RDW 21.0 (H) 10/28/2016 1331   LYMPHSABS 0.4 (L) 10/28/2016 1331   MONOABS 0.5 10/28/2016 1331   EOSABS  0.0 10/28/2016 1331   BASOSABS 0.1 10/28/2016 1331    CMP     Component Value Date/Time   NA 133 (L) 10/28/2016 1331   K 4.0 10/28/2016 1331   CL 100 (L) 09/16/2016 0345   CO2 23 10/28/2016 1331   GLUCOSE 140 10/28/2016 1331   BUN 9.0 10/28/2016 1331   CREATININE 0.6 (L) 10/28/2016 1331   CALCIUM 7.3 (L) 10/28/2016 1331   PROT 4.1 (L) 10/28/2016 1331   ALBUMIN 1.8 (L) 10/28/2016 1331   AST 13 10/28/2016 1331   ALT 22 10/28/2016 1331   ALKPHOS 67 10/28/2016 1331   BILITOT 0.23 10/28/2016 1331   GFRNONAA >60 09/16/2016 0345   GFRAA >60 09/16/2016 0345    RADIOGRAPHIC STUDIES:  [name] Wo Contrast  Addendum Date: 10/14/2016   ADDENDUM REPORT: 10/14/2016 14:58 ADDENDUM: I had misinterpreted clinical history as the patient having been already treated when in fact I am told that is not the case. Therefore, pseudo progression is an impossibility. In that case, we are dealing with a very rapidly  progressive metastasis. Other possibilities that I considered included a second process such as brain lymphoma or brain infection, but find those unlikely in this setting with this appearance. Electronically Signed   By: Nelson Chimes M.D.   On: 10/14/2016 14:58   Result Date: 10/14/2016 CLINICAL DATA:  Followup after initial treatment. New diagnosis lung cancer with left temporal metastasis. EXAM: MRI HEAD WITHOUT AND WITH CONTRAST TECHNIQUE: Multiplanar, multiecho pulse sequences of the brain and surrounding structures were obtained without and with intravenous contrast. CONTRAST:  3mL MULTIHANCE GADOBENATE DIMEGLUMINE 529 MG/ML IV SOLN COMPARISON:  09/12/2016 FINDINGS: Brain: There is a pronounced increase in vasogenic edema throughout the left temporal lobe. Previously seen 4 mm focus of enhancement at the inferior temporal lobe on the left is now AA 2 x 2.5 cm region of indistinct enhancement. This almost certainly represents pseudo progression following treatment. No second brain metastasis is identified. Moderate chronic small-vessel ischemic changes are again seen throughout the brain. No hydrocephalus. No extra-axial fluid collection. Vascular: Major vessels at the base of the brain show flow. Skull and upper cervical spine: Negative Sinuses/Orbits: Mild mucosal inflammation of the paranasal sinuses. New demonstration of bilateral mastoid effusions. Other: None IMPRESSION: Followup exam shows pseudo progression with enlargement of the previously seen 4 mm enhancing focus in the inferolateral left temporal lobe to an indistinct region of enhancement measuring 2 x 2.5 cm. Marked increase in regional vasogenic edema. Electronically Signed: By: Nelson Chimes M.D. On: 10/10/2016 14:09   Nm Pet Image Initial (pi) Skull Base To Thigh  Result Date: 10/22/2016 CLINICAL DATA:  Initial treatment strategy for recently diagnosed non-small cell right lung cancer diagnosed as an inpatient on 09/05/2016 bronchoscopic  biopsy of right mainstem bronchus mass. Patient received radiation therapy to the central right lung mass 09/06/2016-09/18/2016 for clinical SVC syndrome. Brain metastasis identified in the left temporal lobe on recent MRI. EXAM: NUCLEAR MEDICINE PET SKULL BASE TO THIGH TECHNIQUE: 6.7 mCi F-18 FDG was injected intravenously. Full-ring PET imaging was performed from the skull base to thigh after the radiotracer. CT data was obtained and used for attenuation correction and anatomic localization. FASTING BLOOD GLUCOSE:  Value: 97 mg/dl COMPARISON:  09/10/2016 CT abdomen/ pelvis. 09/09/2016 and 09/03/2016 chest CT. FINDINGS: NECK: Hypermetabolic left temporal lobe metastasis with max SUV 20.3 with associated prominent surrounding left temporal lobe vasogenic edema as seen on recent brain MRI. Hypermetabolic 1.0 cm nodule at the posterior  margin of the left parotid gland with max SUV 4.5 (series 4/image 17). Hypermetabolic right supraclavicular lymphadenopathy, for example a 1.9 cm right supraclavicular node with max SUV 14.3 (series 4/image 45), not appreciably changed in size since 09/09/2016. No additional enlarged or hypermetabolic lymph nodes in the neck. CHEST: There is significant interval size progression of the mediastinal invasive central lower right lung mass, which measures 6.1 x 3.9 cm (series 4/image 79) and appears largely necrotic with mild residual hypermetabolism with max SUV 3.6, decreased from 8.9 x 7.0 cm in size on 09/09/2016 using similar measurement technique. The central airways in the right lung are now patent. Moderate dependent right pleural effusion without discrete hypermetabolic right pleural nodules, increased in size since 09/09/2016. Multiple hypermetabolic right paratracheal nodes, for example a high right paratracheal 1.4 cm node with max SUV 12.8 (series 4/image 51). Hypermetabolic 1.5 cm left subcarinal node with max SUV 8.2 (series 4/image 76). Hypermetabolic 0.9 cm high left  internal mammary node with max SUV 4.4 (series 4/image 56). Hypermetabolic bilateral pulmonary nodules, which have increased in size since 09/09/2016, for example a 1.3 cm medial basilar left lower lobe solid pulmonary nodule with max SUV 4.4 (series 8/image 52), increased from 0.3 cm, and a 0.7 cm inferior right middle lobe pulmonary nodule with max SUV 2.6 (series 8/image 57), which appears new. Trace dependent left pleural effusion, stable. Mild centrilobular and paraseptal emphysema. Mildly thick walled 0.8 cm apical right upper lobe cavitary nodule is not associated with significant metabolism and is decreased from 1.4 cm, compatible with resolving inflammatory nodule. Small nodular soft tissue focus of hypermetabolism posterior to the lateral left clavicle with max SUV 3.7. ABDOMEN/PELVIS: Clustered hypermetabolic soft tissue nodules anterior to the left quadratus lumborum muscle with max SUV 3.5 (series 4/image 141). Hypermetabolic 1.1 cm soft tissue nodule in the deep subcutaneous posterior right gluteal region with max SUV 5.2 (series 4/image 172). Additional similar hypermetabolic small nodular soft tissue foci of hypermetabolism are noted deep to the anterior lower right ribs, within the posterior right thigh musculature and in the ventral medial left abdominal wall. No abnormal hypermetabolic activity within the liver, pancreas, adrenal glands, or spleen. No hypermetabolic lymph nodes in the abdomen or pelvis. Stable hyperdense exophytic 2.6 cm posterior upper left renal lesion, indeterminate. Atherosclerotic nonaneurysmal abdominal aorta. Mildly enlarged prostate, unchanged. Dense material within the bladder, presumably related to excreted IV contrast from recent IV contrast administration. Large colonic stool volume. SKELETON: No focal hypermetabolic activity to suggest skeletal metastasis. IMPRESSION: 1. Significant size regression of mediastinal invasive central lower right lung mass, which is  largely necrotic with mild residual hypermetabolism, compatible with significant radiation therapy response. Central right lung airways are now patent. Moderate dependent right pleural effusion is increased. 2. Hypermetabolic bilateral mediastinal and right supraclavicular nodal metastases. 3. Interval growth of hypermetabolic bilateral pulmonary metastases. 4. Multiple small hypermetabolic soft tissue metastases scattered throughout the body including at the posterior margin of the left parotid gland, posterior to the left shoulder, deep to the lower right ribs, ventral to the left lower paraspinal musculature, in the medial ventral left abdominal wall, in the deep subcutaneous right gluteal region and in the posterior proximal right thigh musculature. 5. Hypermetabolic left temporal lobe brain metastasis. 6. Aortic Atherosclerosis (ICD10-I70.0) and Emphysema (ICD10-J43.9). Electronically Signed   By: Ilona Sorrel M.D.   On: 10/22/2016 15:57   ASSESSMENT and THERAPY PLAN:   Primary cancer of right lower lobe of lung ([place]) This is a very pleasant  76 year old white male with a stage IV non-small cell lung cancer, NOS diagnosed in September 2018 and there was insufficient material for immunohistochemical stains or molecular studies presented with large obstructing left lower lobe lung mass in addition to mediastinal lymphadenopathy, contralateral pulmonary nodules as well as recurrent right pleural effusion and suspicious brain metastasis. The patient is feeling very weak and tired at this point, but is trying to gain back his strength. He is currently at a skilled nursing facility and will be discharged later this week.  The patient was seen with Dr. Julien Nordmann. PET scan results were reviewed with the patient and his son. Discussed that there was improvement in the right lung mass, but he continues to have cancer elsewhere in his body. We discussed options including referral to palliative care/hospice versus  systemic treatment with chemotherapy consisting of carboplatin with paclitaxel given every 3 weeks. Discussed with the patient adverse effects of this treatment including but not limited to worsening of his fatigue and weakness , alopecia, myelosuppression, nausea and vomiting. Discussed with the patient and his son that he has incurable condition and on the treatment will be of palliative nature.  The patient is unsure of how he wishes to proceed. He would like to think about this some more. We encouraged him not to wait too long before making a decision due to likely further progression of disease. The patient plans to call us when he makes a decision. We will schedule follow-up appointments accordingly once he makes his decision.  All questions were answered. The patient knows to call the clinic with any problems, questions or concerns. We can certainly see the patient much sooner if necessary.   No orders of the defined types were placed in this encounter.   All questions were answered. The patient knows to call the clinic with any problems, questions or concerns. We can certainly see the patient much sooner if necessary.  Mikey Bussing, NP 10/28/2016   ADDENDUM: Hematology/Oncology Attending: I had a face to face encounter with the patient today. I recommended his care plan. This is a very pleasant 76 years old white male recently diagnosed with metastatic non-small cell lung cancer of unknown subtype secondary to insufficient material for testing. The patient underwent palliative radiotherapy to the large obstructing right lung mass as well as SRS for brain metastasis. 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 He continues to complain of increasing fatigue and weakness. He has a recent PET scan that showed improvement in the central obstructing right lung mass but there are multiple other metastatic  lesions in the lung bilaterally as well as bilateral mediastinal and right supraclavicular nodal metastasis and multiple small hypermetabolic soft tissue metastases scattered throughout the body including the posterior margin of the left parotid gland, posterior to the left shoulder, deep to the lower right ribs, ventral to the left lower paraspinal musculature and also in the deep subcutaneous right gluteal region and in the posterior proximal right side musculature. I had a lengthy discussion with the patient and his son today about his current disease status and treatment options. I explained to the patient that he has incurable condition and on the treatment of palliative nature. I gave the patient the option of palliative care and hospice referral versus consideration of palliative systemic chemotherapy with carboplatin for AUC of 5 and paclitaxel 175 MG/M2 every 3 weeks. Unfortunately without knowing the subtype  of his lung cancer, carboplatin and paclitaxel with the the reasonable choice at this point. I discussed with him the adverse effect of this treatment including but not limited to alopecia, myelosuppression, nausea and vomiting, peripheral neuropathy, liver or renal dysfunction. The patient and his son would like some time to think about the option before committing to treatment or not. He will call the office with his decision. The patient was advised to call immediately if he has any other concerning symptoms in the interval.  Disclaimer: This note was dictated with voice recognition software. Similar sounding words can inadvertently be transcribed and may be missed upon review. Eilleen Kempf, MD 10/28/16

## 2016-10-29 ENCOUNTER — Telehealth: Payer: Self-pay | Admitting: Oncology

## 2016-10-29 NOTE — Progress Notes (Signed)
  Radiation Oncology         [phone] ________________________________  Name: Edward Summers MRN: [id number]  Date: 10/24/2016  DOB: 11/04/1940  End of Treatment Note  Diagnosis:  76 yo man with a 2.5 cm left temporal metastasis from right lower lung non-small cell lung cancer     Indication for treatment:  Palliative       Radiation treatment dates:   10/24/2016  Site/dose:   The target (Left temporal 25 mm) was treated to 18 Gy in 1 fraction.   Beams/energy:   SBRT-SRT technique // 6FFF  Narrative: The patient tolerated stereotactic radiosurgery relatively well.   The patient did not experience any acute side effects with SRS treatment.   Plan: The patient has completed radiation treatment. The patient will return to radiation oncology clinic for routine followup in one month. I advised him to call or return sooner if he has any questions or concerns related to his recovery or treatment. ________________________________  [name]. [name], M.D.   This document serves as a record of services personally performed by Tyler Pita, MD. It was created on his behalf by Arlyce Harman, a trained medical scribe. The creation of this record is based on the scribe's personal observations and the provider's statements to them. This document has been checked and approved by the attending provider.

## 2016-10-29 NOTE — Telephone Encounter (Signed)
Per 10/23 - no los at checkout °

## 2016-11-02 ENCOUNTER — Encounter (HOSPITAL_COMMUNITY): Payer: Self-pay | Admitting: Vascular Surgery

## 2016-11-05 ENCOUNTER — Telehealth: Payer: Self-pay | Admitting: *Deleted

## 2016-11-05 NOTE — Telephone Encounter (Signed)
Oncology Nurse Navigator Documentation  Oncology Nurse Navigator Flowsheets 11/05/2016  Navigator Location [facility].  Navigator Encounter Type Telephone/I called Edward Summers to follow up on his wishes regarding treatment. I called Edward Summers place but was unable to get him or his nurse. I called and spoke with his son Edward Summers. He states he thinks he does not want treatment. He also states he will see him tonight and follow up with [place] tomorrow.   Telephone [employer].  Treatment Phase Pre-Tx/Tx Discussion  Barriers/Navigation Needs Coordination of Care  Interventions Coordination of Care  Coordination of Care Other  Acuity Level 2  Time Spent with Patient 30

## 2016-11-11 ENCOUNTER — Encounter: Payer: Self-pay | Admitting: Radiation Therapy

## 2016-11-11 NOTE — Progress Notes (Signed)
Pt. Is no longer a resident at [facility], his last day was 10/25. I have not been able to make contact with the patient, his son or his significant other. Mr. Jelley cancelled his 1 month follow-up with [facility]. I left a voicemail asking if he would like to continue with follow-up care.   Mont Dutton R.T.(R)(T) Special Procedures Navigator

## 2016-11-12 ENCOUNTER — Ambulatory Visit (HOSPITAL_COMMUNITY): Payer: Medicare HMO | Admitting: Nurse Practitioner

## 2017-01-06 DEATH — deceased

## 2017-11-23 IMAGING — DX DG CHEST 2V
2 series · 2 of 2 positions shown · non-contrast
Comparison: 09/12/2016;

CLINICAL DATA: Cough, congestion shortness of breath for the past
month. History of lung cancer, pneumonia and emphysema.

EXAM:
CHEST  2 VIEW

[chest lat]
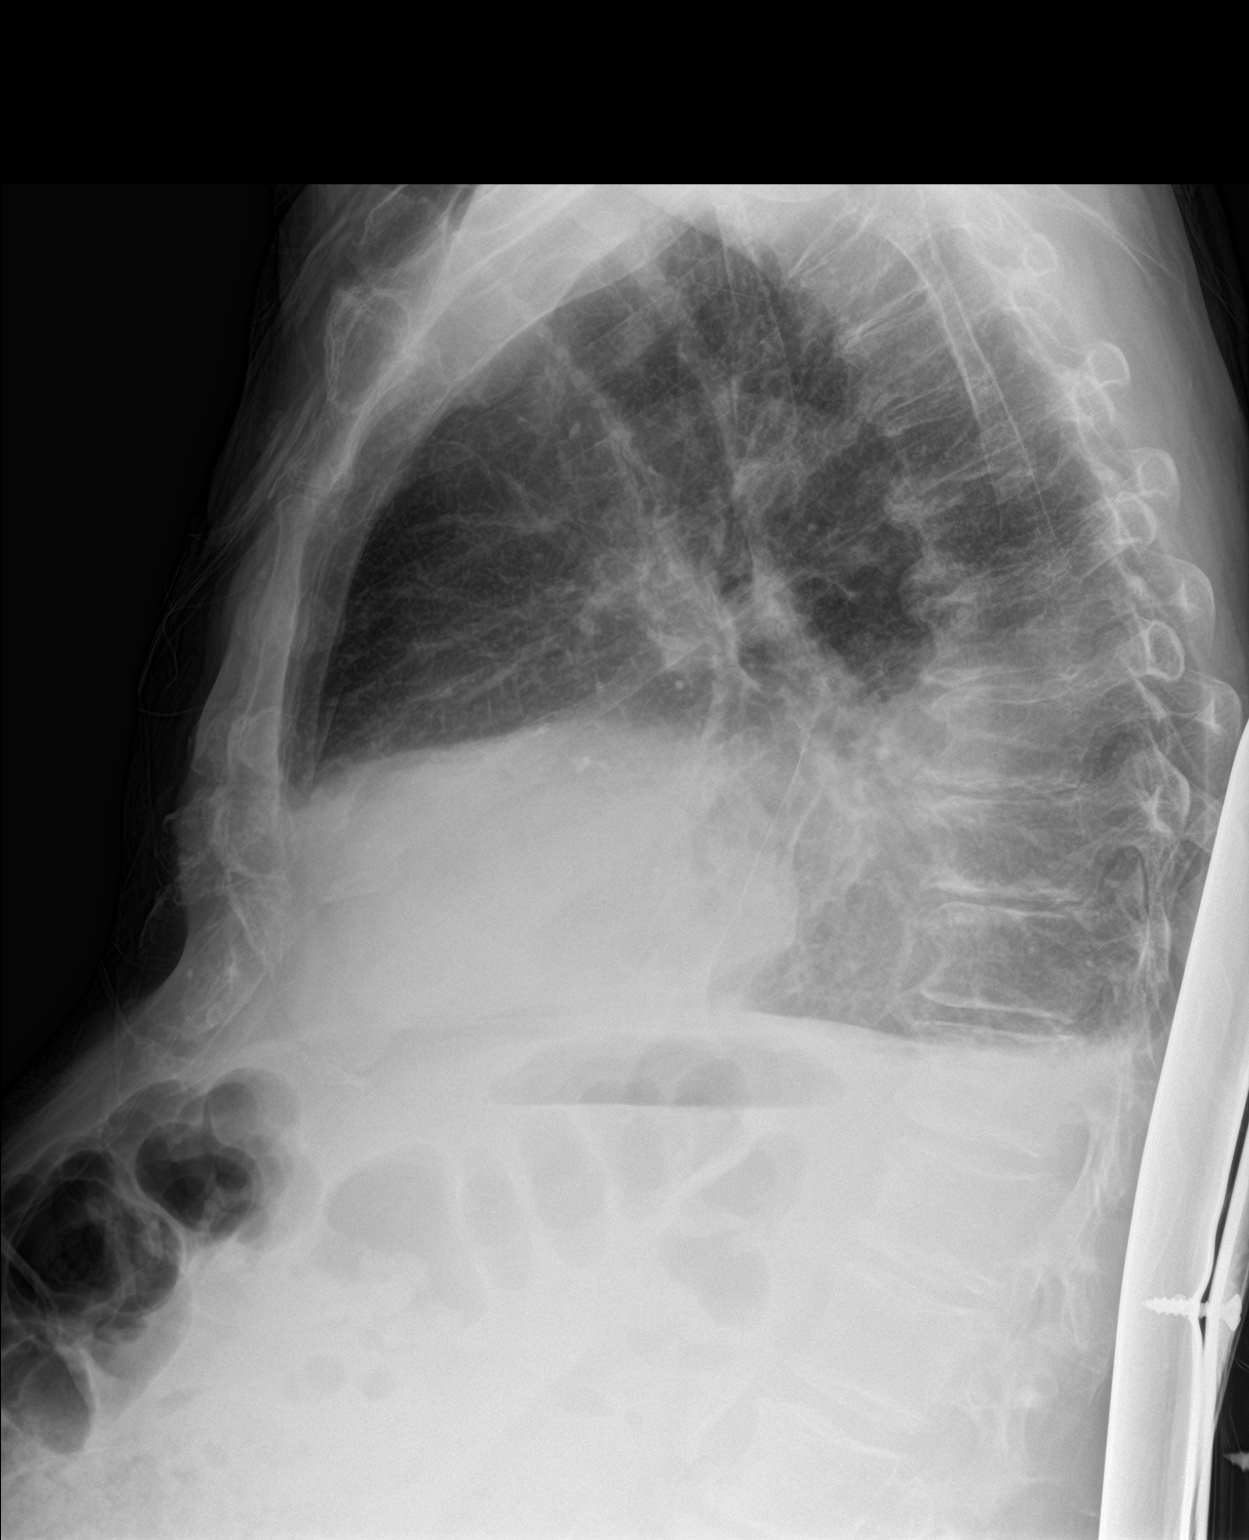

[chest ap]
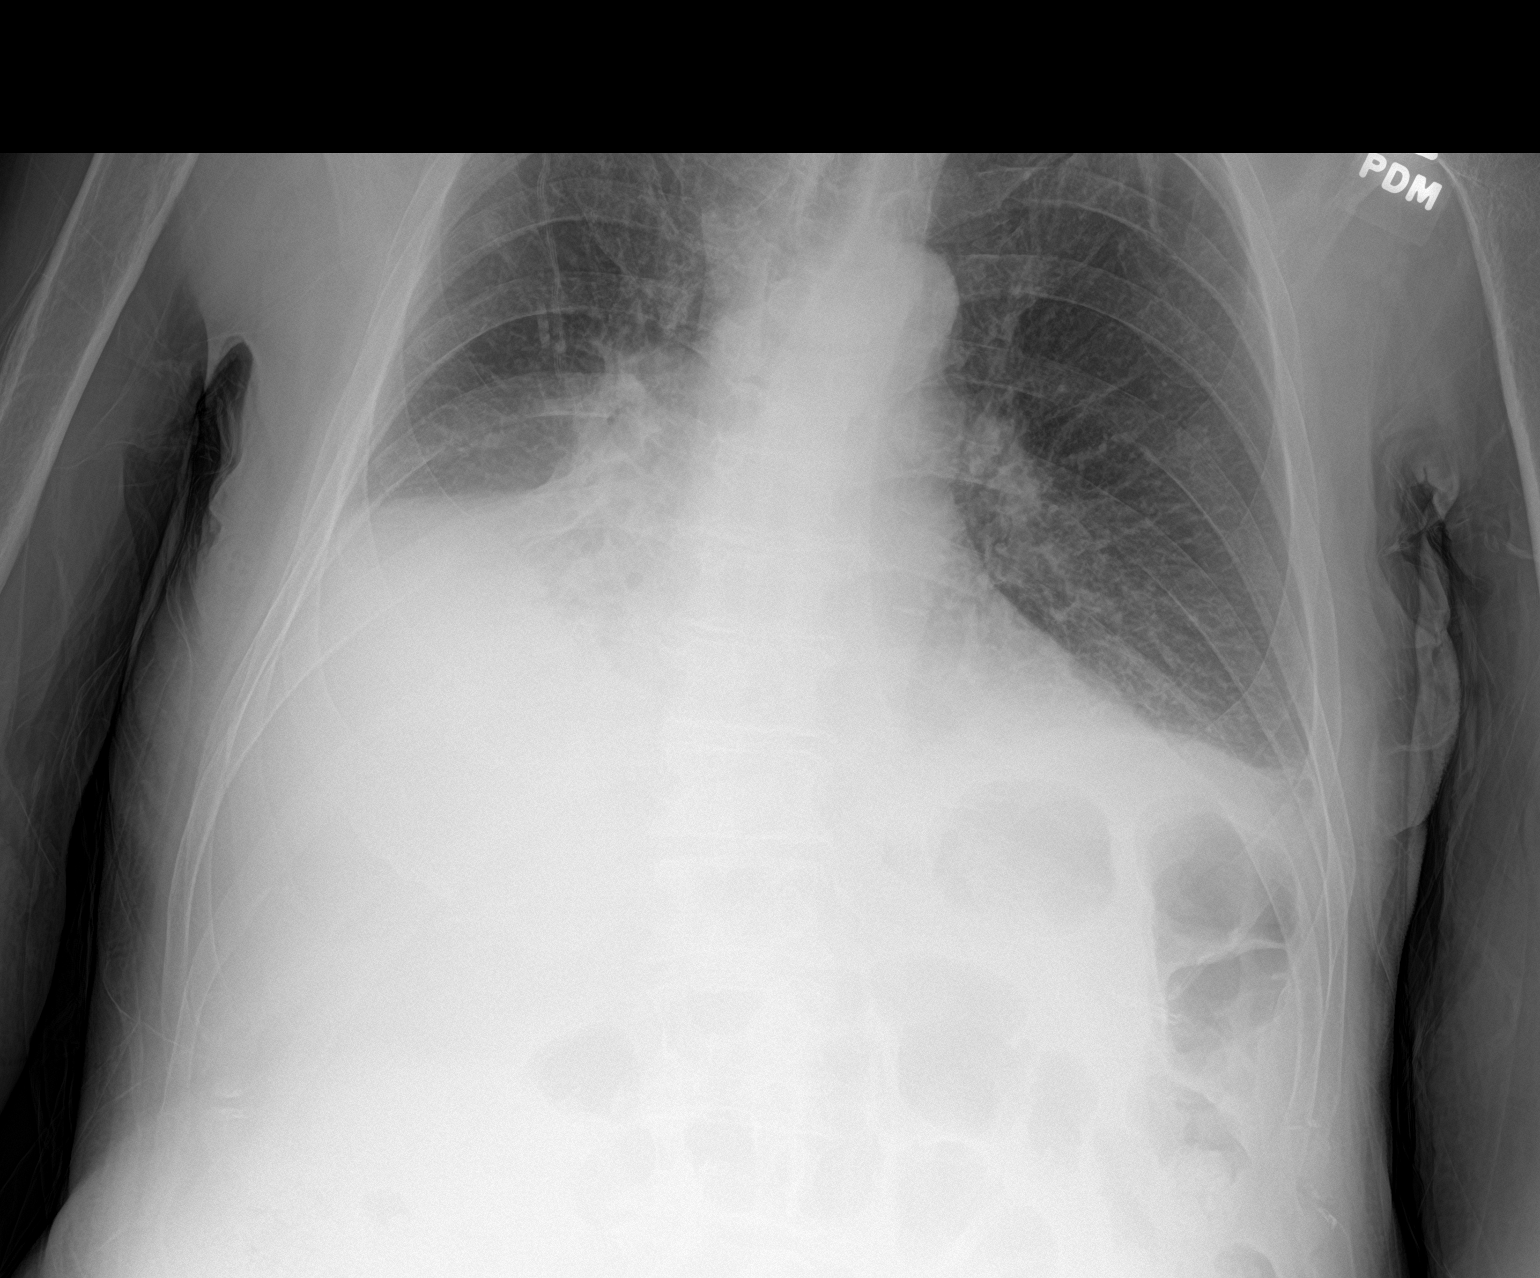

[2 of 2 positions shown; findings below may reference images not displayed]

09/02/2016; chest CT - 09/09/2016;
ultrasound-guided right-sided thoracentesis - 09/11/2016
FINDINGS: Grossly unchanged cardiac silhouette and mediastinal contours with
partial obscuration of the right heart border secondary to known
perihilar mass. Interval recurrence of small to moderate size
right-sided effusion. Interval development of small left-sided
pleural effusion. Worsening bibasilar opacities, right greater than
left. No definite pneumothorax. No definite evidence of edema. No
acute osseus abnormalities.
IMPRESSION: 1. Interval recurrence of small to moderate-sized right-sided
effusion and development of a small left-sided pleural effusion with
worsening bibasilar opacities, right greater than left, atelectasis
versus infiltrate.
2. Known right perihilar mass is suboptimally evaluated given
right-sided pleural effusion.

## 2017-12-19 IMAGING — PT NM PET TUM IMG INITIAL (PI) SKULL BASE T - THIGH
1 of 8 series · 1 of 25 positions shown · non-contrast
Comparison: 09/10/2016 CT abdomen/ pelvis. 09/09/2016 and
09/03/2016 chest CT.

CLINICAL DATA: Initial treatment strategy for recently diagnosed
non-small cell right lung cancer diagnosed as an inpatient on
09/05/2016 bronchoscopic biopsy of right mainstem bronchus mass.
Patient received radiation therapy to the central right lung mass
[DATE] for clinical SVC syndrome. Brain metastasis
identified in the left temporal lobe on recent MRI.

EXAM:
NUCLEAR MEDICINE PET SKULL BASE TO THIGH
TECHNIQUE: 6.7 mCi F-18 FDG was injected intravenously. Full-ring PET imaging
was performed from the skull base to thigh after the radiotracer. CT
data was obtained and used for attenuation correction and anatomic
localization.
FASTING BLOOD GLUCOSE:  Value: 97 mg/dl

[Series 4: ct sk_thigh 5.0 b31f · axial · 5.0mm · 0.98mm/px · 1 of 220 slices shown]
[im 220/220  brain]
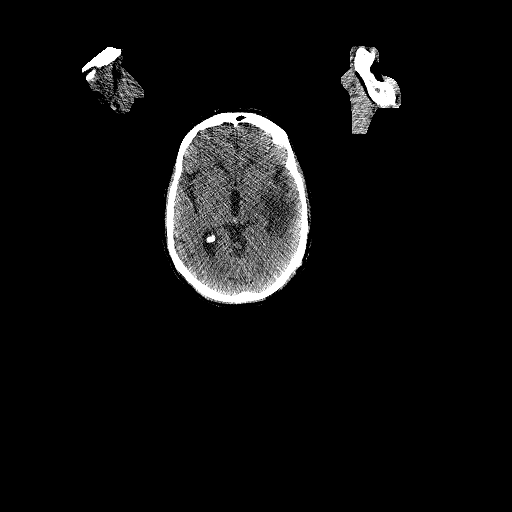

[1 of 25 positions shown; findings below may reference images not displayed]

FINDINGS: NECK:

Hypermetabolic left temporal lobe metastasis with max SUV 20.3 with
associated prominent surrounding left temporal lobe vasogenic edema
as seen on recent brain MRI.

Hypermetabolic 1.0 cm nodule at the posterior margin of the left
parotid gland with max SUV 4.5 (series 4/image 17).

Hypermetabolic right supraclavicular lymphadenopathy, for example a
1.9 cm right supraclavicular node with max SUV 14.3 (series 4/image
45), not appreciably changed in size since 09/09/2016. No additional
enlarged or hypermetabolic lymph nodes in the neck.

CHEST:

There is significant interval size progression of the mediastinal
invasive central lower right lung mass, which measures 6.1 x 3.9 cm
(series 4/image 79) and appears largely necrotic with mild residual
hypermetabolism with max SUV 3.6, decreased from 8.9 x 7.0 cm in
size on 09/09/2016 using similar measurement technique. The central
airways in the right lung are now patent. Moderate dependent right
pleural effusion without discrete hypermetabolic right pleural
nodules, increased in size since 09/09/2016.

Multiple hypermetabolic right paratracheal nodes, for example a high
right paratracheal 1.4 cm node with max SUV 12.8 (series 4/image
51).

Hypermetabolic 1.5 cm left subcarinal node with max SUV 8.2 (series
4/image 76).

Hypermetabolic 0.9 cm high left internal mammary node with max SUV
4.4 (series 4/image 56).

Hypermetabolic bilateral pulmonary nodules, which have increased in
size since 09/09/2016, for example a 1.3 cm medial basilar left
lower lobe solid pulmonary nodule with max SUV 4.4 (series 8/image
52), increased from 0.3 cm, and a 0.7 cm inferior right middle lobe
pulmonary nodule with max SUV 2.6 (series 8/image 57), which appears
new. Trace dependent left pleural effusion, stable. Mild
centrilobular and paraseptal emphysema. Mildly thick walled 0.8 cm
apical right upper lobe cavitary nodule is not associated with
significant metabolism and is decreased from 1.4 cm, compatible with
resolving inflammatory nodule.

Small nodular soft tissue focus of hypermetabolism posterior to the
lateral left clavicle with max SUV 3.7.

ABDOMEN/PELVIS:

Clustered hypermetabolic soft tissue nodules anterior to the left
quadratus lumborum muscle with max SUV 3.5 (series 4/image 141).

Hypermetabolic 1.1 cm soft tissue nodule in the deep subcutaneous
posterior right gluteal region with max SUV 5.2 (series 4/image
172). Additional similar hypermetabolic small nodular soft tissue
foci of hypermetabolism are noted deep to the anterior lower right
ribs, within the posterior right thigh musculature and in the
ventral medial left abdominal wall.

No abnormal hypermetabolic activity within the liver, pancreas,
adrenal glands, or spleen. No hypermetabolic lymph nodes in the
abdomen or pelvis. Stable hyperdense exophytic 2.6 cm posterior
upper left renal lesion, indeterminate. Atherosclerotic
nonaneurysmal abdominal aorta. Mildly enlarged prostate, unchanged.
Dense material within the bladder, presumably related to excreted IV
contrast from recent IV contrast administration. Large colonic stool
volume.

SKELETON: No focal hypermetabolic activity to suggest skeletal
metastasis.
IMPRESSION: 1. Significant size regression of mediastinal invasive central lower
right lung mass, which is largely necrotic with mild residual
hypermetabolism, compatible with significant radiation therapy
response. Central right lung airways are now patent. Moderate
dependent right pleural effusion is increased.
2. Hypermetabolic bilateral mediastinal and right supraclavicular
nodal metastases.
3. Interval growth of hypermetabolic bilateral pulmonary metastases.
4. Multiple small hypermetabolic soft tissue metastases scattered
throughout the body including at the posterior margin of the left
parotid gland, posterior to the left shoulder, deep to the lower
right ribs, ventral to the left lower paraspinal musculature, in the
medial ventral left abdominal wall, in the deep subcutaneous right
gluteal region and in the posterior proximal right thigh
musculature.
5. Hypermetabolic left temporal lobe brain metastasis.
6. Aortic Atherosclerosis ([id number].F) and Emphysema ([id number].[id number]).
